# Patient Record
Sex: Female | Born: 1940
Health system: Southern US, Community
[De-identification: ages and names within clinical notes are randomized; demographics above are authoritative.]

## PROBLEM LIST (undated history)

## (undated) DIAGNOSIS — Z808 Family history of malignant neoplasm of other organs or systems: Secondary | ICD-10-CM

## (undated) DIAGNOSIS — Z8051 Family history of malignant neoplasm of kidney: Secondary | ICD-10-CM

## (undated) DIAGNOSIS — I10 Essential (primary) hypertension: Secondary | ICD-10-CM

## (undated) DIAGNOSIS — E785 Hyperlipidemia, unspecified: Secondary | ICD-10-CM

## (undated) DIAGNOSIS — Z803 Family history of malignant neoplasm of breast: Secondary | ICD-10-CM

## (undated) DIAGNOSIS — Z950 Presence of cardiac pacemaker: Secondary | ICD-10-CM

## (undated) DIAGNOSIS — Z801 Family history of malignant neoplasm of trachea, bronchus and lung: Secondary | ICD-10-CM

## (undated) DIAGNOSIS — C50919 Malignant neoplasm of unspecified site of unspecified female breast: Secondary | ICD-10-CM

## (undated) DIAGNOSIS — R350 Frequency of micturition: Secondary | ICD-10-CM

## (undated) DIAGNOSIS — Z85828 Personal history of other malignant neoplasm of skin: Secondary | ICD-10-CM

## (undated) DIAGNOSIS — Z8042 Family history of malignant neoplasm of prostate: Secondary | ICD-10-CM

## (undated) DIAGNOSIS — E039 Hypothyroidism, unspecified: Secondary | ICD-10-CM

## (undated) DIAGNOSIS — I442 Atrioventricular block, complete: Secondary | ICD-10-CM

## (undated) DIAGNOSIS — Z780 Asymptomatic menopausal state: Secondary | ICD-10-CM

## (undated) DIAGNOSIS — C439 Malignant melanoma of skin, unspecified: Secondary | ICD-10-CM

## (undated) DIAGNOSIS — C4492 Squamous cell carcinoma of skin, unspecified: Secondary | ICD-10-CM

## (undated) DIAGNOSIS — M1990 Unspecified osteoarthritis, unspecified site: Secondary | ICD-10-CM

## (undated) DIAGNOSIS — E079 Disorder of thyroid, unspecified: Secondary | ICD-10-CM

## (undated) HISTORY — DX: Family history of malignant neoplasm of kidney: Z80.51

## (undated) HISTORY — PX: TOTAL KNEE ARTHROPLASTY: SHX125

## (undated) HISTORY — DX: Asymptomatic menopausal state: Z78.0

## (undated) HISTORY — PX: BLADDER REPAIR: SHX76

## (undated) HISTORY — DX: Disorder of thyroid, unspecified: E07.9

## (undated) HISTORY — DX: Family history of malignant neoplasm of other organs or systems: Z80.8

## (undated) HISTORY — PX: COLONOSCOPY: SHX174

## (undated) HISTORY — DX: Presence of cardiac pacemaker: Z95.0

## (undated) HISTORY — DX: Atrioventricular block, complete: I44.2

## (undated) HISTORY — DX: Family history of malignant neoplasm of breast: Z80.3

## (undated) HISTORY — DX: Unspecified osteoarthritis, unspecified site: M19.90

## (undated) HISTORY — PX: KNEE ARTHROSCOPY: SUR90

## (undated) HISTORY — DX: Essential (primary) hypertension: I10

## (undated) HISTORY — PX: ABDOMINAL HYSTERECTOMY: SHX81

## (undated) HISTORY — DX: Family history of malignant neoplasm of prostate: Z80.42

## (undated) HISTORY — DX: Family history of malignant neoplasm of trachea, bronchus and lung: Z80.1

## (undated) HISTORY — DX: Malignant neoplasm of unspecified site of unspecified female breast: C50.919

## (undated) HISTORY — DX: Hyperlipidemia, unspecified: E78.5

---
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
## 1998-06-14 ENCOUNTER — Other Ambulatory Visit: Admission: RE | Admit: 1998-06-14 | Discharge: 1998-06-14 | Payer: Self-pay | Admitting: Obstetrics and Gynecology

## 2001-11-25 ENCOUNTER — Ambulatory Visit (HOSPITAL_COMMUNITY): Admission: RE | Admit: 2001-11-25 | Discharge: 2001-11-25 | Payer: Self-pay | Admitting: Obstetrics and Gynecology

## 2001-11-25 ENCOUNTER — Encounter: Payer: Self-pay | Admitting: Obstetrics and Gynecology

## 2002-02-25 ENCOUNTER — Encounter: Payer: Self-pay | Admitting: Family Medicine

## 2002-02-25 ENCOUNTER — Encounter: Admission: RE | Admit: 2002-02-25 | Discharge: 2002-02-25 | Payer: Self-pay | Admitting: Family Medicine

## 2005-01-08 DIAGNOSIS — I442 Atrioventricular block, complete: Secondary | ICD-10-CM

## 2005-01-08 HISTORY — DX: Atrioventricular block, complete: I44.2

## 2005-07-03 ENCOUNTER — Inpatient Hospital Stay (HOSPITAL_COMMUNITY): Admission: EM | Admit: 2005-07-03 | Discharge: 2005-07-04 | Payer: Self-pay | Admitting: Emergency Medicine

## 2005-07-03 ENCOUNTER — Encounter (INDEPENDENT_AMBULATORY_CARE_PROVIDER_SITE_OTHER): Payer: Self-pay | Admitting: Cardiology

## 2005-07-03 HISTORY — PX: PACEMAKER INSERTION: SHX728

## 2008-03-11 ENCOUNTER — Encounter (INDEPENDENT_AMBULATORY_CARE_PROVIDER_SITE_OTHER): Payer: Self-pay | Admitting: *Deleted

## 2009-04-27 ENCOUNTER — Encounter (INDEPENDENT_AMBULATORY_CARE_PROVIDER_SITE_OTHER): Payer: Self-pay | Admitting: *Deleted

## 2009-06-16 ENCOUNTER — Encounter (INDEPENDENT_AMBULATORY_CARE_PROVIDER_SITE_OTHER): Payer: Self-pay | Admitting: *Deleted

## 2009-06-20 ENCOUNTER — Ambulatory Visit: Payer: Self-pay | Admitting: Gastroenterology

## 2009-06-28 ENCOUNTER — Ambulatory Visit: Payer: Self-pay | Admitting: Gastroenterology

## 2009-06-29 ENCOUNTER — Encounter: Payer: Self-pay | Admitting: Gastroenterology

## 2010-02-07 NOTE — Letter (Signed)
Summary: Previsit letter  [facility]  [address] [place], [place] [address]   Phone: [phone]  Fax: [phone]       04/27/2009 MRN: [id number]  [facility] [address] [place], [place]  [address]  [place]  Dear Ms. Fouche,  Welcome to the Gastroenterology Division at [facility].    You are scheduled to see a nurse for your pre-procedure visit on  06/20/2009 at  8:00am  on the 3rd floor at [facility], [address]  We ask that you try to arrive at our office 15 minutes prior to your appointment time to allow for check-in.  Your nurse visit will consist of discussing your medical and surgical history, your immediate family medical history, and your medications.    Please bring a complete list of all your medications or, if you prefer, bring the medication bottles and we will list them.  We will need to be aware of both prescribed and over the counter drugs.  We will need to know exact dosage information as well.  If you are on blood thinners (Coumadin, Plavix, Aggrenox, Ticlid, etc.) please call our office today/prior to your appointment, as we need to consult with your physician about holding your medication.   Please be prepared to read and sign documents such as consent forms, a financial agreement, and acknowledgement forms.  If necessary, and with your consent, a friend or relative is welcome to sit-in on the nurse visit with you.  Please bring your insurance card so that we may make a copy of it.  If your insurance requires a referral to see a specialist, please bring your referral form from your primary care physician.  No co-pay is required for this nurse visit.     If you cannot keep your appointment, please call [phone] to cancel or reschedule prior to your appointment date.  This allows [place] the opportunity to schedule an appointment for another patient in need of care.    Thank you for choosing  Gastroenterology for your  medical needs.  We appreciate the opportunity to care for you.  Please visit [place] at our website  to learn more about our practice.                     Sincerely.                                                                                                                   The Gastroenterology Division

## 2010-02-07 NOTE — Letter (Signed)
Summary: Patient Notice- Polyp Results  [facility]  [address] [place], [place] [address]   Phone: [phone]  Fax: [phone]        June 29, 2009 MRN: [id number]    [facility] [address] [place], [place]  [address]    Dear Ms. Meggison,  I am pleased to inform you that the colon polyp(s) removed during your recent colonoscopy was (were) found to be benign (no cancer detected) upon pathologic examination.  I recommend you have a repeat colonoscopy examination in 5 years to look for recurrent polyps, as having colon polyps increases your risk for having recurrent polyps or even colon cancer in the future.  Should you develop new or worsening symptoms of abdominal pain, bowel habit changes or bleeding from the rectum or bowels, please schedule an evaluation with either your primary care physician or with me.  Continue treatment plan as outlined the day of your exam.  Please call us if you are having persistent problems or have questions about your condition that have not been fully answered at this time.  Sincerely,  Meryl Dare MD [facility]  This letter has been electronically signed by your physician.  Appended Document: Patient Notice- Polyp Results letter mailed.

## 2010-02-07 NOTE — Miscellaneous (Signed)
Summary: [facility]  Clinical Lists Changes  Medications: Added new medication of MOVIPREP 100 GM  SOLR (PEG-KCL-NACL-NASULF-NA ASC-C) As per prep instructions. - Signed Rx of MOVIPREP 100 GM  SOLR (PEG-KCL-NACL-NASULF-NA ASC-C) As per prep instructions.;  #1 x 0;  Signed;  Entered by: Ezra Sites RN;  Authorized by: Meryl Dare MD [facility];  Method used: Electronically to [id number] [address] [place], [place]  [address], Ph: [phone] or [phone], Fax: [phone] Observations: Added new observation of NKA: T (06/20/2009 7:52)    Prescriptions: MOVIPREP 100 GM  SOLR (PEG-KCL-NACL-NASULF-NA ASC-C) As per prep instructions.  #1 x 0   Entered by:   Ezra Sites RN   Authorized by:   Meryl Dare MD [facility]   Signed by:   Ezra Sites RN on 06/20/2009   Method used:   Electronically to        [facility] (retail)       [address]       [place], [place]  [address]       Ph: [phone] or [phone]       Fax: [phone]   RxID:   [id number]

## 2010-02-07 NOTE — Procedures (Signed)
Summary: Colonoscopy  Patient: [name] Note: All result statuses are Final unless otherwise noted.  Tests: (1) Colonoscopy (COL)   COL Colonoscopy           DONE     [facility]     [address]     [place], [place]  [address]           COLONOSCOPY PROCEDURE REPORT           PATIENT:  Samantha, Scott  MR#:  [id number]     BIRTHDATE:  12/26/1940, 68 yrs. old  GENDER:  female     ENDOSCOPIST:  Judie Petit T. Russella Dar, MD, [facility]           PROCEDURE DATE:  06/28/2009     PROCEDURE:  Colonoscopy with snare polypectomy     ASA CLASS:  Class II     INDICATIONS:  1) follow-up of polyp, surveillance and high-risk     screening, type unknown, 04/2003, 02/1998.     MEDICATIONS:   Fentanyl 75 mcg IV, Versed 8 mg IV     DESCRIPTION OF PROCEDURE:   After the risks benefits and     alternatives of the procedure were thoroughly explained, informed     consent was obtained.  Digital rectal exam was performed and     revealed external hemorrhoids.  The LB PCF-H180AL [id number]     endoscope was introduced through the anus and advanced to the     cecum, which was identified by both the appendix and ileocecal     valve, without limitations.  The quality of the prep was     excellent, using MoviPrep.  The instrument was then slowly     withdrawn as the colon was fully examined.     <<PROCEDUREIMAGES>>     FINDINGS:  A sessile polyp was found in the cecum. It was 10 mm in     size. Polyp was snared, then cauterized with monopolar cautery.     Retrieval was successful. snare polyp  A normal appearing     ileocecal valve, and appendiceal orifice were identified. The     ascending, hepatic flexure, transverse, splenic flexure,     descending, sigmoid colon, and rectum appeared unremarkable.     Retroflexed views in the rectum revealed no abnormalities.   The     time to cecum =  4.33  minutes. The scope was then withdrawn (time     =  15.33  min) from the patient and the procedure  completed.           COMPLICATIONS:  None           ENDOSCOPIC IMPRESSION:     1) 10 mm sessile polyp in the cecum     2) External hemorrhoids           RECOMMENDATIONS:     1) No aspirin or NSAID's for 2 weeks     2) Await pathology results     3) Repeat Colonoscopy in 5 years pending pathology review           Kayson Bullis T. Russella Dar, MD, [facility]           CC: Lupita Raider, MD           n.     Rosalie DoctorVenita Lick. Thoms Barthelemy at 06/28/2009 09:08 AM           Trudee Grip, [id number]  Note: An exclamation mark (!) indicates a result that  was not dispersed into the flowsheet. Document Creation Date: 06/28/2009 9:09 AM _______________________________________________________________________  (1) Order result status: Final Collection or observation date-time: 06/28/2009 09:01 Requested date-time:  Receipt date-time:  Reported date-time:  Referring Physician:   Ordering Physician: Claudette Head [phone]) Specimen Source:  Source: Launa Grill Order Number: [id number] Lab site:   Appended Document: Colonoscopy     Procedures Next Due Date:    Colonoscopy: 06/2014

## 2010-02-07 NOTE — Letter (Signed)
Summary: [facility] Instructions  [facility]  [address] [place], [place] [address]   Phone: [phone]  Fax: [phone]       [name]    1940-03-17    MRN: [id number]        Procedure Day /Date:Tuesday 06/28/2009     Arrival Time: 7:30 am      Procedure Time: 8:30 am     Location of Procedure:                    _x _  [facility] (4th Floor)                        PREPARATION FOR COLONOSCOPY WITH MOVIPREP   Starting 5 days prior to your procedure Thursday 6/16 do not eat nuts, seeds, popcorn, corn, beans, peas,  salads, or any raw vegetables.  Do not take any fiber supplements (e.g. Metamucil, Citrucel, and Benefiber).  THE DAY BEFORE YOUR PROCEDURE         DATE: Monday 6/20  1.  Drink clear liquids the entire day-NO SOLID FOOD  2.  Do not drink anything colored red or purple.  Avoid juices with pulp.  No orange juice.  3.  Drink at least 64 oz. (8 glasses) of fluid/clear liquids during the day to prevent dehydration and help the prep work efficiently.  CLEAR LIQUIDS INCLUDE: Water Jello Ice Popsicles Tea (sugar ok, no milk/cream) Powdered fruit flavored drinks Coffee (sugar ok, no milk/cream) Gatorade Juice: apple, white grape, white cranberry  Lemonade Clear bullion, consomm, broth Carbonated beverages (any kind) Strained chicken noodle soup Hard Candy                             4.  In the morning, mix first dose of MoviPrep solution:    Empty 1 Pouch A and 1 Pouch B into the disposable container    Add lukewarm drinking water to the top line of the container. Mix to dissolve    Refrigerate (mixed solution should be used within 24 hrs)  5.  Begin drinking the prep at 5:00 p.m. The MoviPrep container is divided by 4 marks.   Every 15 minutes drink the solution down to the next mark (approximately 8 oz) until the full liter is complete.   6.  Follow completed prep with 16 oz of clear liquid of your choice (Nothing  red or purple).  Continue to drink clear liquids until bedtime.  7.  Before going to bed, mix second dose of MoviPrep solution:    Empty 1 Pouch A and 1 Pouch B into the disposable container    Add lukewarm drinking water to the top line of the container. Mix to dissolve    Refrigerate  THE DAY OF YOUR PROCEDURE      DATE: Tuesday 6/21  Beginning at 3:30 a.m. (5 hours before procedure):         1. Every 15 minutes, drink the solution down to the next mark (approx 8 oz) until the full liter is complete.  2. Follow completed prep with 16 oz. of clear liquid of your choice.    3. You may drink clear liquids until 6:30 am (2 HOURS BEFORE PROCEDURE).   MEDICATION INSTRUCTIONS  Unless otherwise instructed, you should take regular prescription medications with a small sip of water   as early as possible the morning of your procedure.  Additional medication instructions: Do not take Lisinopril/HCTZ morning of procedure.         OTHER INSTRUCTIONS  You will need a responsible adult at least 70 years of age to accompany you and drive you home.   This person must remain in the waiting room during your procedure.  Wear loose fitting clothing that is easily removed.  Leave jewelry and other valuables at home.  However, you may wish to bring a book to read or  an iPod/MP3 player to listen to music as you wait for your procedure to start.  Remove all body piercing jewelry and leave at home.  Total time from sign-in until discharge is approximately 2-3 hours.  You should go home directly after your procedure and rest.  You can resume normal activities the  day after your procedure.  The day of your procedure you should not:   Drive   Make legal decisions   Operate machinery   Drink alcohol   Return to work  You will receive specific instructions about eating, activities and medications before you leave.    The above instructions have been reviewed and explained  to me by   Ezra Sites RN  June 20, 2009 8:22 AM     I fully understand and can verbalize these instructions _____________________________ Date _________

## 2010-03-16 ENCOUNTER — Institutional Professional Consult (permissible substitution) (INDEPENDENT_AMBULATORY_CARE_PROVIDER_SITE_OTHER): Payer: Medicare Other | Admitting: Internal Medicine

## 2010-03-16 ENCOUNTER — Encounter: Payer: Self-pay | Admitting: Internal Medicine

## 2010-03-16 DIAGNOSIS — I495 Sick sinus syndrome: Secondary | ICD-10-CM

## 2010-03-16 DIAGNOSIS — Z950 Presence of cardiac pacemaker: Secondary | ICD-10-CM | POA: Insufficient documentation

## 2010-03-16 DIAGNOSIS — I10 Essential (primary) hypertension: Secondary | ICD-10-CM | POA: Insufficient documentation

## 2010-03-21 NOTE — Assessment & Plan Note (Signed)
Summary: ep consult; [employer]. dr Ty Hilts has move out state....   Visit Type:  Initial Consult Primary Provider:  Marlowe Aschoff, MD    History of Present Illness: [name] is referred today by Dr. Lupita Raider for ongoing evaluation and management of symptomatic bradycardia s/p PPM. She underwent pacing in 2007 secondary to bradycardia and has done well since. Her main complaint is related to her knee where she has arthrititis. She denies c/p, sob or peripheral edema. She also has HTN and dyslipidemia.  Current Medications (verified): 1)  Amlodipine Besylate 10 Mg Tabs (Amlodipine Besylate) .... Take One Tablet By Mouth Daily 2)  Lisinopril-Hydrochlorothiazide 20-25 Mg Tabs (Lisinopril-Hydrochlorothiazide) .... Once Daily 3)  Pravastatin Sodium 40 Mg Tabs (Pravastatin Sodium) .... Take One Tablet By Mouth Daily At Bedtime 4)  Aspirin 81 Mg Tbec (Aspirin) .... Take One Tablet By Mouth Daily 5)  Synthroid 112 Mcg Tabs (Levothyroxine Sodium) .... Once Daily 6)  Tramadol-Acetaminophen 37.5-325 Mg Tabs (Tramadol-Acetaminophen) .... Two Times A Day 7)  Aleve 220 Mg Tabs (Naproxen Sodium) .... 2 Tabs Two Times A Day  Allergies (verified): No Known Drug Allergies  Past History:  Past Medical History: Last updated: 25-Mar-2010  1.  Complete heart block status post permanent pacemaker, [employer], July 03, 2005, by Dr. Corliss Marcus.  2.  Hypothyroidism, treated.  3.  Hypertension, treated.  4.  Hyperlipidemia, treated.  5.  Arthritis.  6.  Postmenopausal, on replacement therapy.  Past Surgical History: Last updated: March 25, 2010  EQUIPMENT DATA:  The pacing generator is a [employer] model  Insignia 1 Plus DR, model number C2278664, serial number [id number].  The atrial  lead is a [employer] model O3821152, serial number [id number].  The  ventricular lead is a [employer], model number P703588, serial  number [id number]  Family  History: Last updated: 2010-03-25 Mom died of lung cancer.  Dad died of myocardial  infarction, permanent pacemaker/defibrillator.  Social History: Last updated: 2010-03-25  She is retired from [employer].  Married.  No tobacco or  illicit drug use.  Rare glass of wine.  Review of Systems  The patient denies weight loss, weight gain, chest pain, syncope, dyspnea on exertion, peripheral edema, and prolonged cough.         c/o right knee pain, otherwise all systems negative.  Vital Signs:  Patient profile:   70 year old female Height:      63 inches Weight:      179 pounds BMI:     31.82 Pulse rate:   60 / minute BP sitting:   120 / 60  (right arm)  Vitals Entered By: Laurance Flatten CMA (March 16, 2010 10:52 AM)  Physical Exam  General:  Well developed, well nourished, in no acute distress.  HEENT: normal Neck: supple. No JVD. Carotids 2+ bilaterally no bruits Cor: RRR no rubs, gallops or murmur Lungs: CTA. Well healed PPM incision. Ab: soft, nontender. nondistended. No HSM. Good bowel sounds Ext: warm. no cyanosis, clubbing or edema Neuro: alert and oriented. Grossly nonfocal. affect pleasant    MD Comments:  Normal device function.  Impression & Recommendations:  Problem # 1:  CARDIAC PACEMAKER IN SITU (ICD-V45.01) Her device is working normally. Will recheck in several months.  Problem # 2:  ESSENTIAL HYPERTENSION, BENIGN (ICD-401.1) Her blood pressure is well controlled. Continue current meds. Her updated medication list for this problem includes:    Amlodipine Besylate 10 Mg Tabs (Amlodipine  besylate) .Marland Kitchen... Take one tablet by mouth daily    Lisinopril-hydrochlorothiazide 20-25 Mg Tabs (Lisinopril-hydrochlorothiazide) ..... Once daily    Aspirin 81 Mg Tbec (Aspirin) .Marland Kitchen... Take one tablet by mouth daily  Patient Instructions: 1)  Your physician wants you to follow-up in: 12 months with Dr Court Joy will receive a reminder letter in the mail two months in advance.  If you don't receive a letter, please call our office to schedule the follow-up appointment.

## 2010-03-28 NOTE — Cardiovascular Report (Signed)
Summary: Office Visit   Office Visit   Imported By: Roderic Ovens 03/22/2010 11:59:29  _____________________________________________________________________  External Attachment:    Type:   Image     Comment:   External Document

## 2010-05-26 NOTE — Op Note (Signed)
[name], [name]            ACCOUNT NO.:  [id number]   MEDICAL RECORD NO.:  [id number]          PATIENT TYPE:  INP   LOCATION:  2807                         FACILITY:  [facility]   PHYSICIAN:  Francisca December, M.D.  DATE OF BIRTH:  [birth year]   DATE OF PROCEDURE:  07/03/2005  DATE OF DISCHARGE:                                 OPERATIVE REPORT   PROCEDURE PERFORMED:  1.  Insertion dual-chamber permanent transvenous pacemaker.  2.  Left subclavian venogram.   INDICATIONS:  [name] is a 70 year old woman who presented to  Dr. Alric Seton office earlier to the day complaining of profound  fatigue.  This had been most severe for the past 2 days, but had been  occurring intermittently for the past 10 days.  She was found to have a  bradycardic rhythm and electrocardiogram showed a high degree AV block.  She  was referred to the emergency room where she was evaluated by Dr. Carolanne Grumbling and found to be in complete heart block intermittently.  She is  brought to the catheterization laboratory at this time for insertion of dual-  chamber permanent transvenous pacemaker as she is not taking any medications  that might produce this conduction abnormality.   PROCEDURE NOTE:  The patient is brought to catheterization laboratory in  fasting state.  The left prepectoral region was prepped and draped in the  usual sterile fashion.  Local anesthesia was obtained with infiltration of  1% lidocaine with epinephrine throughout the left prepectoral region.  A  left subclavian venogram was then performed with a peripheral injection of  20 mL of Omnipaque.  A digital cine angiogram was obtained in the AP  projection and road mapped to guide future left subclavian puncture.  The  venogram did demonstrate the left subclavian vein to be widely patent and  coursing in a normal fashion over the anterior surface of the first rib and  beneath the middle third of the clavicle.  There was no  evidence for  persistence of the left superior vena cava.  A 6-7 cm incision was then made  in the deltopectoral groove, and this was carried down, by sharp dissection  and electrocautery, to the prepectoral fascia.  There a plane was lifted and  pocket formed inferiorly and medially, using blunt dissection and  electrocautery.  The pocket was then packed with 1% kanamycin soaked gauze.  Two separate left subclavian punctures were then performed with an 18-gauge,  thin-wall needle through which was passed a 0.038-inch tight J guidewire.  Over the initial guidewire an 8-French tearaway sheath and dilator were  advanced.  Dilator and wire were removed and the ventricular lead was  advanced to the level of the right atrium.  The sheath was torn away.  Using  standard technique and fluoroscopic landmarks, the lead was manipulated into  the right ventricular apex.  There excellent pacing parameters were obtained  as will be noted below.  The lead was tested for diaphragmatic pacing at 10  volts and none was found.  The lead was then sutured into place using three  separate 0 silk ligatures.  Over the remaining guidewire, an 8-French  tearaway sheath and dilator were advanced.  The dilator was removed.  The  wire was allowed to remain in place and the atrial lead was advanced to the  level of the right atrium.  The sheath was then torn away.  A figure-of-  eight hemostasis suture was placed in the pectoralis muscle, around the  insertion site of the leads, to control backbleeding.  Using standard  technique and fluoroscopic landmarks, the lead was manipulated into the  right atrial appendage.  Adequate pacing parameters were not obtained.  It  was, therefore, re-manipulated into the lateral wall where excellent pacing  parameters were obtained.  This was an active fixation lead and the screw  was advanced as appropriate.  The lead was then tested for diaphragmatic  pacing at 10 volts and none was  found.  The lead was then sutured into place  using three separate 0 silk ligatures.  The remaining guidewire and the  kanamycin soaked gauze were removed from the pocket.  The pocket was  copiously irrigated using 1% kanamycin solution.  The leads were then  attached to the pacing generator, carefully identifying each by its serial  number and placing each in the appropriate receptacle.  Each lead was  tightened into place and tested for security.  The leads were then wound  beneath the pacing generator and the generator was placed in the pocket.  The generator was anchored to the pectoralis muscle with a 1 silk suture.  The pocket was then inspected for bleeding and none was found.  The pocket  was then closed using 2-0 Vicryl in a running fashion for the subcutaneous  layer.  Two layers were applied.  The skin was approximated using 4-0 Vicryl  in a running subcuticular fashion.  Steri-Strips and a sterile dressing were  applied.  The patient was transported to the recovery area an A-sense, V-  pace mode.   EQUIPMENT DATA:  The pacing generator is a [employer]  Insignia 1 Plus DR, model number C2278664, serial number [id number].  The atrial  lead is a [employer] model O3821152, serial number [id number].  The  ventricular lead is a [employer], model number P703588, serial  number [id number].   PACING DATA:  The ventricular lead detected a 9.9 mV R-wave.  The pacing  threshold was 0.2 volts at 0.5 milliseconds pulse width.  The impedance was  412 ohms, resulting in a current at capture threshold of 1.1 MA.  The atrial  lead detected a 4.4 mV P-wave.  The pacing threshold was 0.5 volts at 0.5  milliseconds pulse width.  The impedance was 556 ohms, resulting in a  current capture threshold of 1 MA.      Francisca December, M.D.  Electronically Signed     JHE/MEDQ  D:  07/03/2005  T:  07/04/2005  Job:  [id number]   cc:   Donia Guiles, M.D.  Fax: [phone]

## 2010-05-26 NOTE — Discharge Summary (Signed)
NAMELUBERTA, [name]            ACCOUNT NO.:  [id number]   MEDICAL RECORD NO.:  [id number]          PATIENT TYPE:  INP   LOCATION:  6531                         FACILITY:  [facility]   PHYSICIAN:  Armanda Magic, M.D.     DATE OF BIRTH:  1940/05/06   DATE OF ADMISSION:  07/03/2005  DATE OF DISCHARGE:  07/04/2005                                 DISCHARGE SUMMARY   DISCHARGE DIAGNOSES:  1.  Complete heart block status post permanent pacemaker, Guidant, July 03, 2005, by Dr. Corliss Marcus.  2.  Hypothyroidism, treated.  3.  Hypertension, treated.  4.  Hyperlipidemia, treated.  5.  Arthritis.  6.  Postmenopausal, on replacement therapy.   Ms. Perfetti is a 70 year old female who was admitted on July 03, 2005, after  a 17-month history of fatigue and dyspnea on exertion.  Her symptoms have  exacerbated over the past 2 weeks.  She saw her primary care physician  today.  A 12-lead EKG showed a 2nd degree AV block.  She was sent  immediately to the emergency room, where she transiently when in and out of  3rd degree AV block.  She was taken to the catheterization lab the same day,  and a Guidant permanent pacemaker was implanted by Dr. Corliss Marcus.  The  patient tolerated the procedure well and was kept overnight for observation.   A 2-D echo was also performed to assure no underlying gross abnormalities as  far as wall motion or ejection fraction.  Her echo was essentially normal  with an EF of 60%.  No wall motion abnormalities.  She had mild MR with mild  pulmonary hypertension.  Our plan is for her to go home today following a  chest x-ray, which unfortunately has not been performed.  As long as the  chest x-ray shows no pneumothorax, she will be discharged to home in stable  condition.   DISCHARGE MEDICATIONS:  1.  Lisinopril/hydrochlorothiazide 20/25 one p.o. daily.  2.  Synthroid 125 mcg a day.  3.  Premarin 0.6 mg a day.  4.  Baby aspirin 81 mg a day.   She was given a  discharge instruction sheet regarding permanent pacemakers  and activity/wound care.  She has a followup appointment to see Dr. Amil Amen  on July 10, 2005, at 1:15 p.m.      Guy Franco, P.A.      Armanda Magic, M.D.  Electronically Signed    LB/MEDQ  D:  07/04/2005  T:  07/04/2005  Job:  [id number]   cc:   Francisca December, M.D.  Fax: [phone]   Donia Guiles, M.D.  Fax: [phone]

## 2010-05-26 NOTE — H&P (Signed)
NAMEBRIGITT, MCCLISH            ACCOUNT NO.:  [id number]   MEDICAL RECORD NO.:  [id number]          PATIENT TYPE:  INP   LOCATION:  1829                         FACILITY:  [facility]   PHYSICIAN:  Armanda Magic, M.D.     DATE OF BIRTH:  [birth year]   DATE OF ADMISSION:  07/03/2005  DATE OF DISCHARGE:                                HISTORY & PHYSICAL   CHIEF COMPLAINT:  Fatigue.   HISTORY OF PRESENT ILLNESS:  Ms. Roll is a 70 year old female with no  known history of coronary artery disease who complains of fatigue and  dyspnea on exertion for 2 months.  Her symptoms were worse over the past 2  weeks.  She finally made an appointment with Dr. Arvilla Market and saw him today  in the office.  Electrocardiogram showed second degree AV block; however, on  the monitor in the emergency room, she has transiently developed third  degree AV block with a ventricular response of 38 beats per minute.  She  denies any chest pain, dizziness, presyncope or syncope.  Other than the  above symptoms, the patient denies any other problems.   PAST MEDICAL HISTORY:  1.  Hypothyroidism (dose recently changed).  2.  Hypertension.  3.  Dyslipidemia.  The patient was on Zetia, but stated that she had      bilateral knee pain and stopped it.  4.  Arthritis.  5.  _________ on Premarin.   ALLERGIES:  No known drug allergies.   MEDICATIONS:  1.  Lisinopril/HCTZ 25/25 mg one p.o. daily.  2.  Synthroid 125 mcg daily.  3.  Premarin 0.6 mg daily.  4.  Baby aspirin 81 mg daily.  5.  Aleve daily for knee arthritis.   SOCIAL HISTORY:  She is retired from Engineer, [employer].  Married.  No tobacco or  illicit drug use.  Rare glass of wine.   FAMILY HISTORY:  Mom died of lung cancer.  Dad died of myocardial  infarction, permanent pacemaker/defibrillator.   PHYSICAL EXAMINATION:  VITAL SIGNS:  Temperature 97.7, blood pressure  152/73, pulse 40, respirations 20.  HEENT:  Grossly normal.  No carotid or subclavian bruits.  No  JVD or  thyromegaly.  Sclerae clear.  Conjunctivae normal.  Nares without drainage.  CHEST:  Clear to auscultation bilaterally.  No wheezing or rhonchi.  HEART:  Irregular.  A soft 1/6 systolic murmur.  ABDOMEN:  Soft.  Good bowel sounds.  Nontender, nondistended.  No masses, no  femoral bruits.  EXTREMITIES:  No peripheral edema.  Palpable PT pulses bilaterally.  NEUROLOGIC:  Cranial nerves II-XII grossly intact.  SKIN:  Warm and dry.   LABORATORY DATA:  Currently pending.   CHEST X-RAY:  Pending.   ELECTROCARDIOGRAM:  A third degree AV block with ventricular rate of 39.   ASSESSMENT AND PLAN:  1.  Third degree AV block.  Plan to admit to CCU, check electrolytes.  Plan      a permanent pacemaker today under the care of Dr. Lorenda Peck.  2.  Hypothyroidism; replace.  3.  Hypertension, treated.  4.  Postmenopausal on replacement therapy.  5.  Dyslipidemia.   The patient has been seen and examined by Dr. Armanda Magic.  The plan has  been generated by Dr. Mayford Knife.  We will check a 2-D echocardiogram to assure  no significant wall motion abnormalities.  Plans are to check the patient's  electrolytes and then plan for permanent pacemaker today under the care of  Dr. Corliss Marcus.      Guy Franco, P.A.      Armanda Magic, M.D.  Electronically Signed    LB/MEDQ  D:  07/03/2005  T:  07/03/2005  Job:  [id number]   cc:   Donia Guiles, M.D.  Fax: [phone]

## 2010-06-15 DIAGNOSIS — I495 Sick sinus syndrome: Secondary | ICD-10-CM

## 2010-06-16 ENCOUNTER — Encounter: Payer: Self-pay | Admitting: Internal Medicine

## 2010-06-19 ENCOUNTER — Encounter: Payer: Self-pay | Admitting: Internal Medicine

## 2010-06-23 ENCOUNTER — Encounter: Payer: Self-pay | Admitting: Internal Medicine

## 2010-08-08 ENCOUNTER — Telehealth: Payer: Self-pay | Admitting: Internal Medicine

## 2010-08-08 NOTE — Telephone Encounter (Signed)
Walk In Pt Form " Pt dropped off Surgery Clearance paper from [facility]" sent to Kelly/Taylor  08/08/10/km

## 2010-10-27 ENCOUNTER — Ambulatory Visit (HOSPITAL_COMMUNITY)
Admission: RE | Admit: 2010-10-27 | Discharge: 2010-10-27 | Disposition: A | Discharge status: Home / Self Care | Payer: Medicare Other | Source: Ambulatory Visit | Attending: Orthopedic Surgery | Admitting: Orthopedic Surgery

## 2010-10-27 ENCOUNTER — Other Ambulatory Visit: Payer: Self-pay | Admitting: Orthopedic Surgery

## 2010-10-27 ENCOUNTER — Encounter (HOSPITAL_COMMUNITY): Payer: Medicare Other

## 2010-10-27 ENCOUNTER — Other Ambulatory Visit (HOSPITAL_COMMUNITY): Payer: Self-pay | Admitting: Orthopedic Surgery

## 2010-10-27 DIAGNOSIS — Z01818 Encounter for other preprocedural examination: Secondary | ICD-10-CM | POA: Insufficient documentation

## 2010-10-27 DIAGNOSIS — I10 Essential (primary) hypertension: Secondary | ICD-10-CM | POA: Insufficient documentation

## 2010-10-27 DIAGNOSIS — Z01810 Encounter for preprocedural cardiovascular examination: Secondary | ICD-10-CM | POA: Insufficient documentation

## 2010-10-27 DIAGNOSIS — Z950 Presence of cardiac pacemaker: Secondary | ICD-10-CM | POA: Insufficient documentation

## 2010-10-27 DIAGNOSIS — Z01812 Encounter for preprocedural laboratory examination: Secondary | ICD-10-CM | POA: Insufficient documentation

## 2010-10-27 DIAGNOSIS — M1710 Unilateral primary osteoarthritis, unspecified knee: Secondary | ICD-10-CM | POA: Insufficient documentation

## 2010-10-27 DIAGNOSIS — Z01811 Encounter for preprocedural respiratory examination: Secondary | ICD-10-CM | POA: Insufficient documentation

## 2010-10-27 LAB — COMPREHENSIVE METABOLIC PANEL
AST: 17 U/L (ref 0–37)
Albumin: 4.2 g/dL (ref 3.5–5.2)
Alkaline Phosphatase: 74 U/L (ref 39–117)
Chloride: 105 mEq/L (ref 96–112)
Potassium: 4.7 mEq/L (ref 3.5–5.1)
Sodium: 142 mEq/L (ref 135–145)
Total Bilirubin: 0.3 mg/dL (ref 0.3–1.2)
Total Protein: 7.6 g/dL (ref 6.0–8.3)

## 2010-10-27 LAB — CBC
HCT: 40.2 % (ref 36.0–46.0)
Hemoglobin: 13.3 g/dL (ref 12.0–15.0)
RBC: 4.45 MIL/uL (ref 3.87–5.11)
WBC: 7.1 10*3/uL (ref 4.0–10.5)

## 2010-10-27 LAB — URINALYSIS, ROUTINE W REFLEX MICROSCOPIC
Glucose, UA: NEGATIVE mg/dL
Ketones, ur: NEGATIVE mg/dL
Leukocytes, UA: NEGATIVE

## 2010-10-27 LAB — PROTIME-INR
INR: 0.98 (ref 0.00–1.49)
Prothrombin Time: 13.2 seconds (ref 11.6–15.2)

## 2010-10-27 LAB — SURGICAL PCR SCREEN: Staphylococcus aureus: NEGATIVE

## 2010-10-30 ENCOUNTER — Inpatient Hospital Stay (HOSPITAL_COMMUNITY)
Admission: RE | Admit: 2010-10-30 | Discharge: 2010-11-02 | DRG: 470 | Disposition: A | Discharge status: Home Health | Payer: Medicare Other | Source: Ambulatory Visit | Attending: Orthopedic Surgery | Admitting: Orthopedic Surgery

## 2010-10-30 DIAGNOSIS — Z79899 Other long term (current) drug therapy: Secondary | ICD-10-CM

## 2010-10-30 DIAGNOSIS — Z950 Presence of cardiac pacemaker: Secondary | ICD-10-CM

## 2010-10-30 DIAGNOSIS — Z01812 Encounter for preprocedural laboratory examination: Secondary | ICD-10-CM

## 2010-10-30 DIAGNOSIS — E7800 Pure hypercholesterolemia, unspecified: Secondary | ICD-10-CM | POA: Diagnosis present

## 2010-10-30 DIAGNOSIS — M21169 Varus deformity, not elsewhere classified, unspecified knee: Secondary | ICD-10-CM | POA: Diagnosis present

## 2010-10-30 DIAGNOSIS — E039 Hypothyroidism, unspecified: Secondary | ICD-10-CM | POA: Diagnosis present

## 2010-10-30 DIAGNOSIS — Z01810 Encounter for preprocedural cardiovascular examination: Secondary | ICD-10-CM

## 2010-10-30 DIAGNOSIS — M1710 Unilateral primary osteoarthritis, unspecified knee: Principal | ICD-10-CM | POA: Diagnosis present

## 2010-10-30 DIAGNOSIS — I10 Essential (primary) hypertension: Secondary | ICD-10-CM | POA: Diagnosis present

## 2010-10-30 DIAGNOSIS — Z7982 Long term (current) use of aspirin: Secondary | ICD-10-CM

## 2010-10-30 LAB — TYPE AND SCREEN: Antibody Screen: NEGATIVE

## 2010-10-30 LAB — ABO/RH: ABO/RH(D): O POS

## 2010-10-31 LAB — CBC
Hemoglobin: 10.4 g/dL — ABNORMAL LOW (ref 12.0–15.0)
MCHC: 34.1 g/dL (ref 30.0–36.0)
Platelets: 189 10*3/uL (ref 150–400)
RBC: 3.42 MIL/uL — ABNORMAL LOW (ref 3.87–5.11)

## 2010-10-31 LAB — BASIC METABOLIC PANEL
GFR calc non Af Amer: 88 mL/min — ABNORMAL LOW (ref >90)
Glucose, Bld: 137 mg/dL — ABNORMAL HIGH (ref 70–99)
Potassium: 3.7 mEq/L (ref 3.5–5.1)
Sodium: 134 mEq/L — ABNORMAL LOW (ref 135–145)

## 2010-11-01 LAB — CBC
Hemoglobin: 10.3 g/dL — ABNORMAL LOW (ref 12.0–15.0)
MCH: 30 pg (ref 26.0–34.0)
Platelets: 183 10*3/uL (ref 150–400)
RBC: 3.43 MIL/uL — ABNORMAL LOW (ref 3.87–5.11)
WBC: 14.2 10*3/uL — ABNORMAL HIGH (ref 4.0–10.5)

## 2010-11-01 LAB — BASIC METABOLIC PANEL
CO2: 27 mEq/L (ref 19–32)
Calcium: 8.9 mg/dL (ref 8.4–10.5)
Chloride: 100 mEq/L (ref 96–112)
Glucose, Bld: 118 mg/dL — ABNORMAL HIGH (ref 70–99)
Potassium: 3.6 mEq/L (ref 3.5–5.1)
Sodium: 135 mEq/L (ref 135–145)

## 2010-11-01 NOTE — H&P (Addendum)
[name], [name]            ACCOUNT NO.:  [id number]  MEDICAL RECORD NO.:  [id number]  LOCATION:                                 FACILITY:  PHYSICIAN:  Ollen Gross, M.D.    DATE OF BIRTH:  1940-12-07  DATE OF ADMISSION:  10/30/2010 DATE OF DISCHARGE:                             HISTORY & PHYSICAL   CHIEF COMPLAINT:  Right knee pain.  HISTORY OF PRESENT ILLNESS:  Patient is a 70 year old female, who has been seen by Dr. Lequita Halt for ongoing knee pain.  She has known end-stage arthritis, progressively gotten worse.  She has failed conservative measures, so she would benefit undergoing surgical intervention.  Risks and benefits have been discussed.  She elected to proceed with surgery. There are no contraindications such as ongoing infection or progressive neurological disease.  ALLERGIES:  No known drug allergies.  CURRENT MEDICATIONS: 1. Amlodipine 10 mg. 2. Lisinopril and hydrochlorothiazide 20/25. 3. Synthroid 0.112 mg. 4. Pravastatin 40 mg. 5. Baby aspirin. 6. Tramadol. 7. Venaliv.  PAST MEDICAL HISTORY: 1. Hypertension. 2. Hypercholesterolemia. 3. Pacemaker placement. 4. Hypothyroidism.  PAST SURGICAL HISTORY: 1. Hysterectomy. 2. Knee arthroscopy. 3. Pacemaker procedure.  FAMILY HISTORY:  Father with heart disease.  Mother with lung cancer.  SOCIAL HISTORY:  Married, retired, past smoker, 3 children.  Husband will be assisting in care.  Does have a living will, healthcare power of attorney.  REVIEW OF SYSTEMS:  GENERAL:  No fever, chills, or night sweats.  NEURO: No seizure, syncope, or paralysis.  RESPIRATORY:  No shortness breath, productive cough, or hemoptysis.  CARDIOVASCULAR:  No chest pain or orthopnea.  GI:  No nausea, vomiting, diarrhea, or constipation.  GU: No dysuria, hematuria, or discharge.  MUSCULOSKELETAL:  Knee pain.  PHYSICAL EXAMINATION:  VITAL SIGNS:  Pulse 68, respirations 14, blood pressure 136/70. GENERAL:  A 70 year old  white female, well nourished, well developed, in no acute distress.  She is alert, oriented, and cooperative. HEENT:  Normocephalic, atraumatic.  Pupils are round and reactive.  EOMs intact.  Does not wear glasses. NECK:  Supple. CHEST:  Clear, anterior posterior chest walls.  No rhonchi, rales, or wheezing. HEART:  Pacemaker is in the upper left chest wall.  Heart, regular rate and rhythm without murmur, S1, S2.  ABDOMEN:  Soft, nontender.  Bowel sounds are present. RECTAL/BREASTS/GENITALIA:  Not done, not part of present illness. EXTREMITIES:  Right knee, slight varus.  Range of motion 5 to 125, tender more medial and lateral, no instability.  IMPRESSION:  Osteoarthritis, right knee.  PLAN:  The patient will be admitted to [facility] to undergo a right total knee replacement arthroplasty.  Surgery will be performed by Dr. Ollen Gross.     [name], P.A.C.   ______________________________ Ollen Gross, M.D.    ALP/MEDQ  D:  10/29/2010  T:  10/30/2010  Job:  [id number]  cc:   Ollen Gross, M.D. Fax: [phone]  Doylene Canning. Ladona Ridgel, MD [address]  [address] [place] [address]  Lupita Raider, M.D. Fax: [phone]  Electronically Signed by Patrica Duel P.A.C. on 10/30/2010 04:24:49 PM Electronically Signed by Ollen Gross M.D. on 11/01/2010 11:32:12 AM Electronically Signed by Ollen Gross  M.D. on 11/01/2010 11:34:26 AM

## 2010-11-01 NOTE — Op Note (Addendum)
NAMEKHRYSTINA, [name] NO.:  [id number]  MEDICAL RECORD NO.:  [id number]  LOCATION:  1609                         FACILITY:  [facility]  PHYSICIAN:  Ollen Gross, M.D.    DATE OF BIRTH:  Sep 18, 1940  DATE OF PROCEDURE:  10/30/2010 DATE OF DISCHARGE:                              OPERATIVE REPORT   PREOPERATIVE DIAGNOSIS:  Osteoarthritis, right knee.  POSTOPERATIVE DIAGNOSIS:  Osteoarthritis, right knee.  PROCEDURE:  Right total knee arthroplasty.  SURGEON:  Ollen Gross, M.D.  ASSISTANT:  Alexzandrew L. Perkins, P.A.C.  ANESTHESIA:  Spinal.  ESTIMATED BLOOD LOSS:  Minimal.  DRAIN:  Hemovac x1.  TOURNIQUET TIME:  35 minutes at 300 mmHg.  FINDINGS:  Bone-on-bone arthritis of the medial and patellofemoral compartments of the right knee with varus deformity and periarticular osteophytes.  COMPLICATIONS:  None.  CONDITION:  Stable to recovery.  BRIEF CLINICAL NOTE:  [name] is a 70 year old female with advanced end-stage arthritis of the right knee with progressively worsening pain and dysfunction.  She has difficulty with activities of daily living. She has had extensive long-term nonoperative management including exercise, corticosteroid injections, and viscosupplement injections without benefit.  Analgesics have been ineffective.  It is now limiting her significantly.  She has pain at rest and with activity.  She has essentially failed nonoperative management and presents for total knee arthroplasty.  PROCEDURE IN DETAIL:  After successful administration of spinal anesthetic, a tourniquet was placed high on her right thigh and her right lower extremity.  It was prepped and draped in the usual sterile fashion.  Extremities wrapped in Esmarch, knee flexed, tourniquet inflated to 300 mmHg.  Midline incision was made with a 10 blade through subcutaneous tissue to the level of the extensor mechanism.  A fresh blade was used to make a medial  parapatellar arthrotomy.  Soft tissue on the proximal medial tibia was subperiosteally elevated to the joint line with the knife and into the semimembranosus bursa with a Cobb elevator. Soft tissue laterally was elevated with attention being paid to avoiding the patellar tendon on a tibial tubercle.  The patella was everted, knee flexed 90 degrees, and ACL and PCL removed.  Drill was used to create a starting hole in the distal femur and the canal was thoroughly irrigated.  The 5-degree right valgus alignment guide was placed and the distal femoral cutting block was pinned to remove 11-mm off the distal femur.  Distal femoral resection was made with an oscillating saw.  The tibia subluxed forward and the menisci removed.  The extramedullary tibial alignment guide was placed referencing proximally at the medial aspect of the tibial tubercle and distally along the second metatarsal axis and tibial crest.  The block was pinned to remove 2-mm off the more deficient medial side.  Tibial resection was made with an oscillating saw.  Size 2.5 was the most appropriate tibial component.  The proximal tibia was prepared to modular drill and keel punch for the size 2.5.  Femoral sizing guide was placed and size 2.5 was the most appropriate on the femur.  The rotations marked off the epicondylar axis and confirmed by creating a rectangular flexion gap at 90 degrees.  The block was  pinned in this rotation and the anterior, posterior, and chamfer cuts were made.  Intercondylar block was placed and that cut was made.  Trial size 2.5 posterior stabilized femur was placed.  A 10-mm posterior stabilized rotating platform insert trial was placed.  With the 10, full extensions achieved with excellent varus-valgus and anterior-posterior balance throughout full range of motion.  Patella was everted and the thickness measured to be 22-mm.  Freehand resection taken to 12-mm, 35 template was placed, lug holes  were drilled, trial patella was placed and it tracked normally.  Osteophytes removed off the posterior femur with the trial in place.  All trials were removed and the cut bone surfaces prepared with pulsatile lavage.  Cement was mixed and once ready for implantation, the size 2.5 mobile bearing tibial tray, 2.5 posterior stabilized femur, and 35 patella were cemented into place. The patella was held with a clamp.  Trial 10-mm inserts placed, knee held in full extension, and all extruded cement removed.  When the cement was fully hardened and the knee permanent, 10-mm posterior stabilized rotating platform insert was placed into the tibial tray. Wound was copiously irrigated with saline solution and the arthrotomy closed over Hemovac drain with interrupted #1 PDS.  Flexion against gravity was 135 degrees and patella tracked normally.  Tourniquet was released for a total tourniquet time of 35 minutes.  Subcu was closed with interrupted 2-0 Vicryl and subcuticular running 4-0 Monocryl. Catheter for the Marcaine pain pump was placed and pumps initiated. Incisions cleaned and dried.  Steri-Strips and a bulky sterile dressing applied.  She was then placed into a knee immobilizer, awakened, and transported to recovery in stable condition.  Please note that a surgical assistant was a medical necessity for this procedure in order to perform it in a safe and expeditious manner. Surgical assistant was necessary for retraction of ligaments and vital neurovascular structures and for proper positionings of the limb to allow for anatomic placement of prosthetic components.     Ollen Gross, M.D.     FA/MEDQ  D:  10/30/2010  T:  10/30/2010  Job:  [id number]  cc:   Lupita Raider, M.D. Fax: [phone]  Electronically Signed by Ollen Gross M.D. on 11/01/2010 11:32:16 AM Electronically Signed by Ollen Gross M.D. on 11/01/2010 11:34:30 AM

## 2010-11-02 LAB — CBC
HCT: 28.1 % — ABNORMAL LOW (ref 36.0–46.0)
Hemoglobin: 9.2 g/dL — ABNORMAL LOW (ref 12.0–15.0)
MCH: 29.6 pg (ref 26.0–34.0)
MCV: 90.4 fL (ref 78.0–100.0)
RBC: 3.11 MIL/uL — ABNORMAL LOW (ref 3.87–5.11)
WBC: 11.9 10*3/uL — ABNORMAL HIGH (ref 4.0–10.5)

## 2010-11-12 NOTE — Discharge Summary (Signed)
[name], [name]            ACCOUNT NO.:  [id number]  MEDICAL RECORD NO.:  [id number]  LOCATION:  1609                         FACILITY:  [facility]  PHYSICIAN:  Ollen Gross, M.D.    DATE OF BIRTH:  [birth year]  DATE OF ADMISSION:  10/30/2010 DATE OF DISCHARGE:  11/02/2010                              DISCHARGE SUMMARY   ADMITTING DIAGNOSES: 1. Osteoarthritis, right knee. 2. Hypertension. 3. Hypercholesterolemia. 4. Pacemaker placement. 5. Hypothyroidism.  DISCHARGE DIAGNOSES: 1. Osteoarthritis, right knee, status post right total knee     replacement arthroplasty. 2. Postoperative acute blood loss anemia. 3. Hyponatremia, improving. 4. Hypertension. 5. Hypercholesterolemia. 6. Pacemaker placement. 7. Hypothyroidism.  PROCEDURE:  On October 30, 2010, right total knee.  SURGEON:  Ollen Gross, M.D.  ASSISTANT:  Alexzandrew L. Perkins, P.A.C.  ANESTHESIA:  Spinal.  TOURNIQUET TIME:  35 minutes.  CONSULTS:  None.  BRIEF HISTORY:  The patient is a 70 year old female seen by Dr. Lequita Scott for ongoing knee pain.  She has known bone-on-bone arthritis, medium patellofemoral parts of the right knee with varus deformity, periarticular osteophytes, she has failed operative management, now presents for total knee arthroplasty.  LABORATORY DATA:  CBC not scanned in the chart, the one that was done on admission, but postop hemoglobin of 10.4 dropped down to 10.3, last H and H down to 9.2 with a crit of 28.1.  Chem panel on admission not scanned in the chart, but serial BMETs were followed, sodium was 134, little low on day #1, but came back up to 135.  Remaining electrolytes remained within normal limits.  Glucose did go up a little bit to 137 back down to 118.  Blood group type O positive.  HOSPITAL COURSE:  The patient was admitted to [facility], taken to the OR, underwent the above-stated procedure without complication.  The patient tolerated the procedure  well, later was transferred to the orthopedic floor.  She was given 24 hours postop IV antibiotics and p.o. and IV analgesics, had some pain through the night, a little bit better by the morning of day 1, had decent urinary output, we watched that.  Hemovac drain placed on the day of surgery was pulled without difficulty.  Sodium was a little low, but felt to be a dilutional component, she concentrated right back up.  She started to get out of bed on day #1.  By day #2, was already sitting up in the chair, no complaints, pain was under better control.  We changed her dressing.  The incision looked good.  Hemoglobin was 10.3.  She continued to progress with physical therapy and was ready to go home by the following day, on day #3 when met all of her goals.  DISCHARGE PLAN: 1. The patient discharged home on November 02, 2010. 2. Discharge diagnoses, please see above. 3. Discharge meds; Robaxin, OxyIR, Xarelto.  Continue amlodipine,     lisinopril, HCTZ, pravastatin, Synthroid, and tramadol, also Nu-     Iron.  DIET:  Heart healthy diet.  FOLLOWUP:  In 2 weeks.  ACTIVITY:  Weightbearing as tolerated.  Total knee protocol.  DISPOSITION:  Home.  CONDITION ON DISCHARGE:  Improving.     Alexzandrew L.  Julien Girt, P.A.C.   ______________________________ Ollen Gross, M.D.    ALP/MEDQ  D:  11/09/2010  T:  11/09/2010  Job:  [id number]  cc:   Doylene Canning. Ladona Ridgel, MD [address]  [address] [place] [address]  Lupita Raider, M.D. Fax: [phone]  Electronically Signed by Patrica Duel P.A.C. on 11/11/2010 09:27:45 AM Electronically Signed by Ollen Gross M.D. on 11/12/2010 08:40:06 PM

## 2010-11-13 SURGERY — ARTHROPLASTY, KNEE, TOTAL
Anesthesia: Choice | Site: Knee | Laterality: Right

## 2010-12-11 ENCOUNTER — Encounter: Payer: Self-pay | Admitting: Internal Medicine

## 2010-12-11 DIAGNOSIS — I495 Sick sinus syndrome: Secondary | ICD-10-CM

## 2011-03-12 ENCOUNTER — Encounter: Payer: Self-pay | Admitting: Internal Medicine

## 2011-03-12 DIAGNOSIS — I495 Sick sinus syndrome: Secondary | ICD-10-CM

## 2011-05-25 ENCOUNTER — Encounter: Payer: Self-pay | Admitting: Internal Medicine

## 2011-05-25 ENCOUNTER — Ambulatory Visit (INDEPENDENT_AMBULATORY_CARE_PROVIDER_SITE_OTHER): Payer: Medicare Other | Admitting: Internal Medicine

## 2011-05-25 VITALS — BP 125/72 | HR 70 | Ht 63.0 in | Wt 181.0 lb

## 2011-05-25 DIAGNOSIS — I10 Essential (primary) hypertension: Secondary | ICD-10-CM

## 2011-05-25 DIAGNOSIS — Z950 Presence of cardiac pacemaker: Secondary | ICD-10-CM

## 2011-05-25 LAB — PACEMAKER DEVICE OBSERVATION
AL IMPEDENCE PM: 460 Ohm
ATRIAL PACING PM: 0
BAMS-0003: 70 {beats}/min
RV LEAD IMPEDENCE PM: 340 Ohm
RV LEAD THRESHOLD: 1 V
VENTRICULAR PACING PM: 100

## 2011-05-25 NOTE — Assessment & Plan Note (Signed)
Her device is working normally. We'll plan to recheck in several months. 

## 2011-05-25 NOTE — Patient Instructions (Signed)
Your physician wants you to follow-up in: 1 year with Dr. Taylor. You will receive a reminder letter in the mail two months in advance. If you don't receive a letter, please call our office to schedule the follow-up appointment.  

## 2011-05-25 NOTE — Progress Notes (Signed)
HPI [name] returns today for followup. She is a pleasant 71 yo woman with a h/o symptomatic bradycardia, status post permanent pacemaker insertion. She also has hypertension. Since her last visit, she has undergone knee replacement surgery and has done well. She has no pain. She denies chest pain, shortness of breath, or peripheral edema. No Known Allergies   Current Outpatient Prescriptions  Medication Sig Dispense Refill  . amLODipine (NORVASC) 10 MG tablet Take 1 tablet by mouth Daily.      Marland Kitchen aspirin 81 MG tablet Take 81 mg by mouth daily.      Marland Kitchen lisinopril-hydrochlorothiazide (PRINZIDE,ZESTORETIC) 20-25 MG per tablet Take 1 tablet by mouth Daily.      . naproxen sodium (ANAPROX) 220 MG tablet Take 220 mg by mouth 2 (two) times daily with a meal.      . pravastatin (PRAVACHOL) 40 MG tablet Take 1 tablet by mouth Daily.      Marland Kitchen SYNTHROID 112 MCG tablet Take 1 tablet by mouth Daily.      . traMADol-acetaminophen (ULTRACET) 37.5-325 MG per tablet Take 1 tablet by mouth every 6 (six) hours as needed.         Past Medical History  Diagnosis Date  . CHB (complete heart block) 2007  . Arthritis   . Postmenopausal   . Essential hypertension, benign   . Cardiac pacemaker in situ     ROS:   All systems reviewed and negative except as noted in the HPI.   Past Surgical History  Procedure Date  . Pacemaker insertion 07-03-2005    [employer] Guidant      Family History  Problem Relation Age of Onset  . Lung cancer Mother   . Heart attack Father     pacemaker & defibrillator     History   Social History  . Marital Status: Married    Spouse Name: N/A    Number of Children: N/A  . Years of Education: N/A   Occupational History  . retired    Social History Main Topics  . Smoking status: Never Smoker   . Smokeless tobacco: Not on file  . Alcohol Use: Yes     wine rarely  . Drug Use: No  . Sexually Active: Not on file   Other Topics Concern  . Not on file     Social History Narrative  . No narrative on file     BP 125/72  Pulse 70  Ht 5\' 3"  (1.6 m)  Wt 181 lb (82.101 kg)  BMI 32.06 kg/m2  Physical Exam:  Well appearing 71 year old woman, NAD HEENT: Unremarkable Neck:  No JVD, no thyromegally Lungs:  Clear with no wheezes, rales, or rhonchi. Well-healed pacemaker incision. HEART:  Regular rate rhythm, no murmurs, no rubs, no clicks Abd:  soft, positive bowel sounds, no organomegally, no rebound, no guarding Ext:  2 plus pulses, no edema, no cyanosis, no clubbing Skin:  No rashes no nodules Neuro:  CN II through XII intact, motor grossly intact  DEVICE  Normal device function.  See PaceArt for details.   Assess/Plan:

## 2011-05-25 NOTE — Assessment & Plan Note (Signed)
Her blood pressure is well controlled. She will continue her current medical therapy, increase her physical activity, and maintain a low-sodium diet.

## 2011-06-11 DIAGNOSIS — I495 Sick sinus syndrome: Secondary | ICD-10-CM

## 2011-09-11 DIAGNOSIS — I495 Sick sinus syndrome: Secondary | ICD-10-CM

## 2011-12-13 DIAGNOSIS — I495 Sick sinus syndrome: Secondary | ICD-10-CM

## 2012-03-13 DIAGNOSIS — I495 Sick sinus syndrome: Secondary | ICD-10-CM

## 2012-03-28 ENCOUNTER — Encounter: Payer: Self-pay | Admitting: Internal Medicine

## 2012-06-03 ENCOUNTER — Ambulatory Visit (INDEPENDENT_AMBULATORY_CARE_PROVIDER_SITE_OTHER): Payer: Medicare Other | Admitting: Internal Medicine

## 2012-06-03 ENCOUNTER — Encounter: Payer: Self-pay | Admitting: Internal Medicine

## 2012-06-03 VITALS — BP 122/64 | HR 75 | Ht 63.0 in | Wt 194.0 lb

## 2012-06-03 DIAGNOSIS — I442 Atrioventricular block, complete: Secondary | ICD-10-CM

## 2012-06-03 DIAGNOSIS — I10 Essential (primary) hypertension: Secondary | ICD-10-CM

## 2012-06-03 DIAGNOSIS — Z950 Presence of cardiac pacemaker: Secondary | ICD-10-CM

## 2012-06-03 LAB — PACEMAKER DEVICE OBSERVATION
AL AMPLITUDE: 2.7 mv
AL THRESHOLD: 0.5 V
ATRIAL PACING PM: 2
BAMS-0001: 170 {beats}/min
BAMS-0002: 8 ms
DEVICE MODEL PM: 316585
RV LEAD IMPEDENCE PM: 350 Ohm
RV LEAD THRESHOLD: 1 V

## 2012-06-03 NOTE — Progress Notes (Signed)
HPI [name] returns today for followup. She is a very pleasant 72 year old woman with a history of complete heart block, status post permanent pacemaker insertion. She also has hypertension. In the interim, she has done well. She denies chest pain, shortness of breath, or syncope. Minimal peripheral edema is present. She admits to some sodium indiscretion.  No Known Allergies   Current Outpatient Prescriptions  Medication Sig Dispense Refill  . amLODipine (NORVASC) 10 MG tablet Take 1 tablet by mouth Daily.      Marland Kitchen aspirin 81 MG tablet Take 81 mg by mouth daily.      Marland Kitchen lisinopril-hydrochlorothiazide (PRINZIDE,ZESTORETIC) 20-25 MG per tablet Take 1 tablet by mouth Daily.      . naproxen sodium (ANAPROX) 220 MG tablet Take 220 mg by mouth 2 (two) times daily with a meal.      . pravastatin (PRAVACHOL) 40 MG tablet Take 1 tablet by mouth Daily.      Marland Kitchen SYNTHROID 112 MCG tablet Take 1 tablet by mouth Daily.      . traMADol-acetaminophen (ULTRACET) 37.5-325 MG per tablet Take 1 tablet by mouth every 6 (six) hours as needed.       No current facility-administered medications for this visit.     Past Medical History  Diagnosis Date  . CHB (complete heart block) 2007  . Arthritis   . Postmenopausal   . Essential hypertension, benign   . Cardiac pacemaker in situ   . Atrioventricular block, complete     ROS:   All systems reviewed and negative except as noted in the HPI.   Past Surgical History  Procedure Laterality Date  . Pacemaker insertion  07-03-2005    [employer] Guidant      Family History  Problem Relation Age of Onset  . Lung cancer Mother   . Heart attack Father     pacemaker & defibrillator     History   Social History  . Marital Status: Married    Spouse Name: N/A    Number of Children: N/A  . Years of Education: N/A   Occupational History  . retired    Social History Main Topics  . Smoking status: Never Smoker   . Smokeless tobacco: Not on  file  . Alcohol Use: Yes     Comment: wine rarely  . Drug Use: No  . Sexually Active: Not on file   Other Topics Concern  . Not on file   Social History Narrative  . No narrative on file     BP 122/64  Pulse 75  Ht 5\' 3"  (1.6 m)  Wt 194 lb (87.998 kg)  BMI 34.37 kg/m2  Physical Exam:  Well appearing 72 year old woman, NAD HEENT: Unremarkable Neck:  No JVD, no thyromegally Lymphatics:  No adenopathy Back:  No CVA tenderness Lungs:  Clear with no wheezes, rales, or rhonchi. Well-healed pacemaker incision. HEART:  Regular rate rhythm, no murmurs, no rubs, no clicks Abd:  soft, positive bowel sounds, no organomegally, no rebound, no guarding Ext:  2 plus pulses, no edema, no cyanosis, no clubbing Skin:  No rashes no nodules Neuro:  CN II through XII intact, motor grossly intact  DEVICE  Normal device function.  See PaceArt for details.   Assess/Plan:

## 2012-06-03 NOTE — Assessment & Plan Note (Signed)
Her [employer] dual-chamber pacemaker is working normally. We'll plan to recheck in several months. 

## 2012-06-03 NOTE — Assessment & Plan Note (Signed)
Her blood pressure is well controlled. We'll plan to recheck in several months. No change in medical therapy.

## 2012-06-03 NOTE — Patient Instructions (Signed)
Your physician wants you to follow-up in: 12 months with Dr. Taylor. You will receive a reminder letter in the mail two months in advance. If you don't receive a letter, please call our office to schedule the follow-up appointment.    

## 2012-06-12 ENCOUNTER — Encounter: Payer: Self-pay | Admitting: Internal Medicine

## 2012-09-23 DIAGNOSIS — I495 Sick sinus syndrome: Secondary | ICD-10-CM

## 2012-10-09 ENCOUNTER — Encounter: Payer: Self-pay | Admitting: Internal Medicine

## 2012-11-21 IMAGING — CR DG CHEST 2V
2 series · 2 of 2 positions shown · non-contrast
Comparison: 07/04/2005

CLINICAL DATA: Preoperative assessment for knee replacement,
hypertension, former smoker

CHEST - 2 VIEW

[w chest pa]
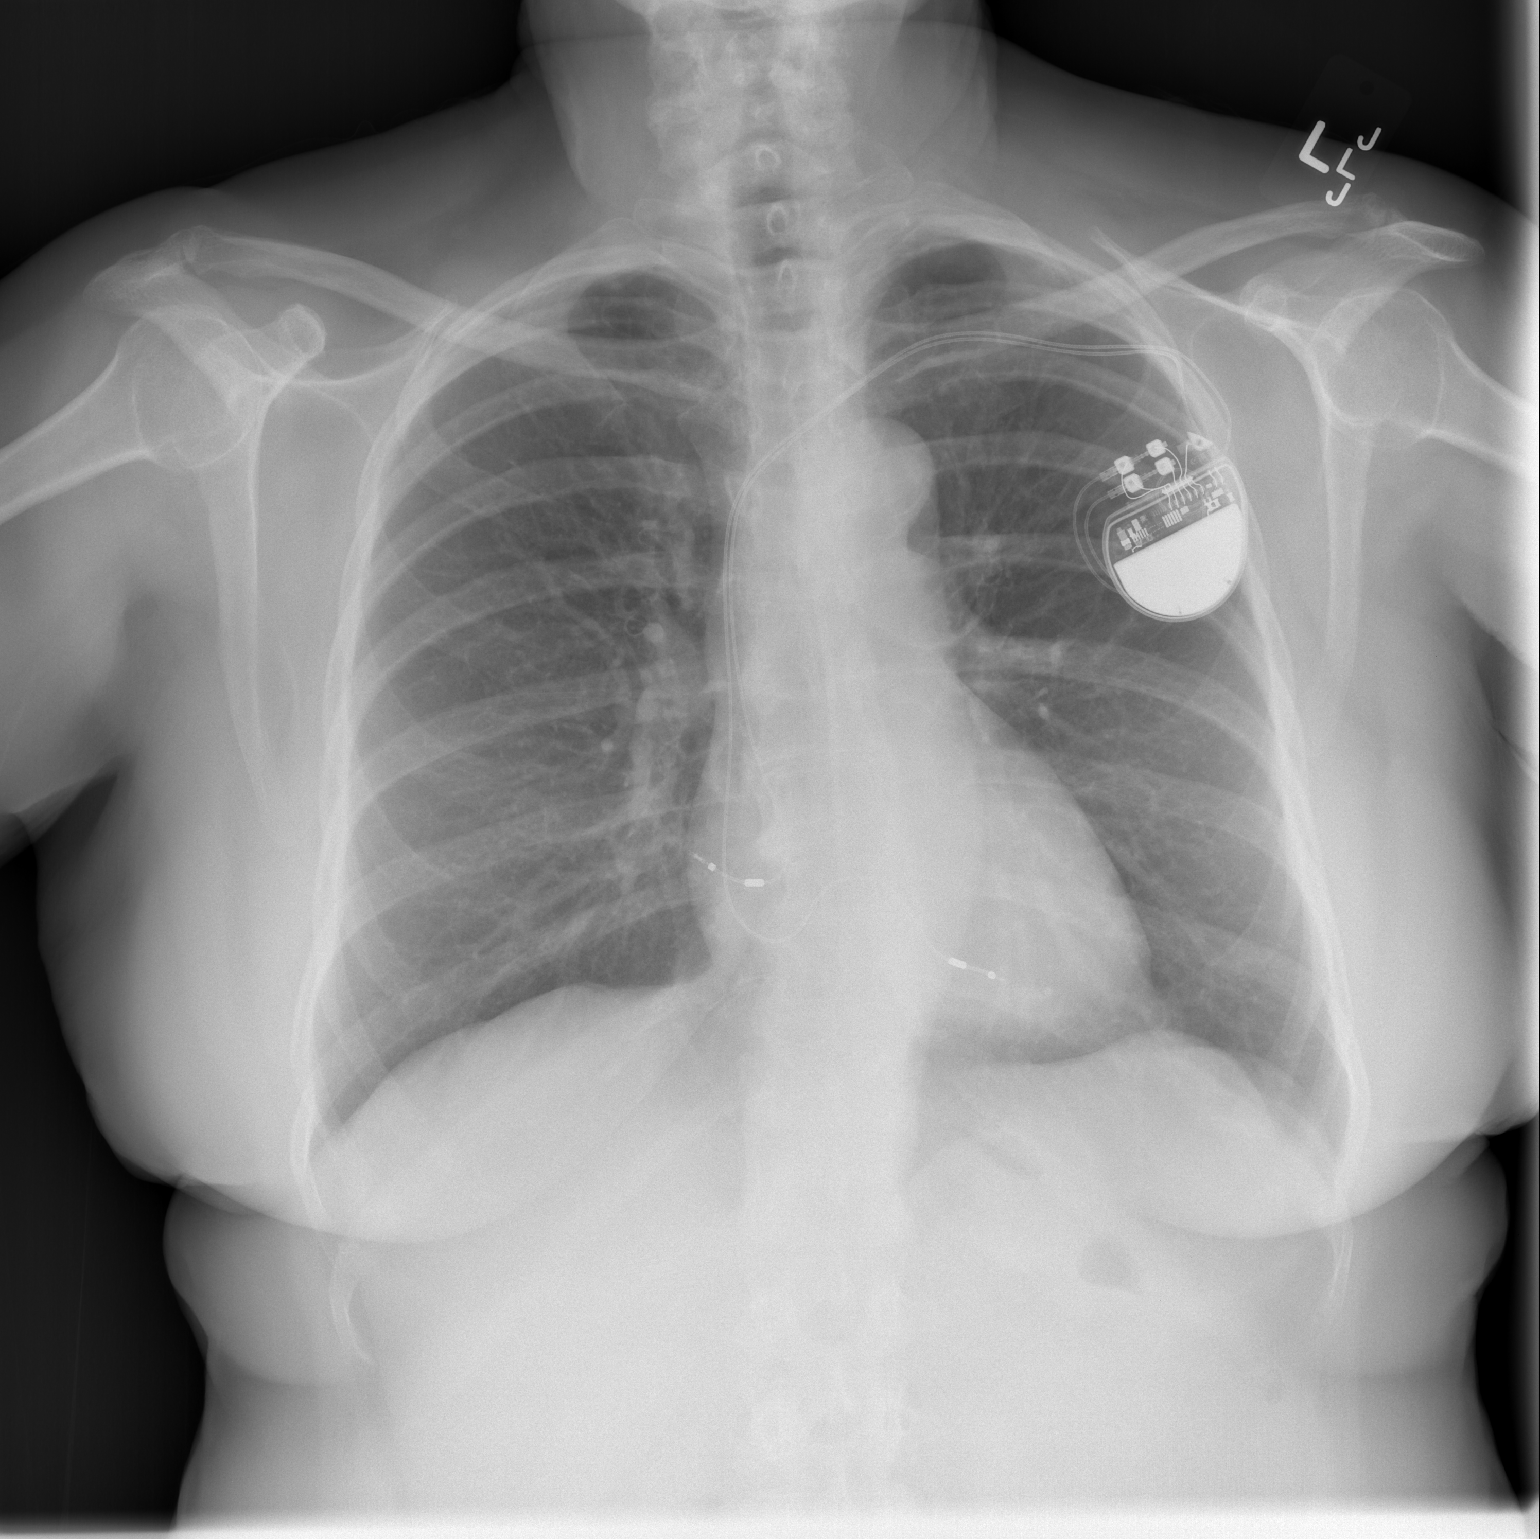

[w chest lat]
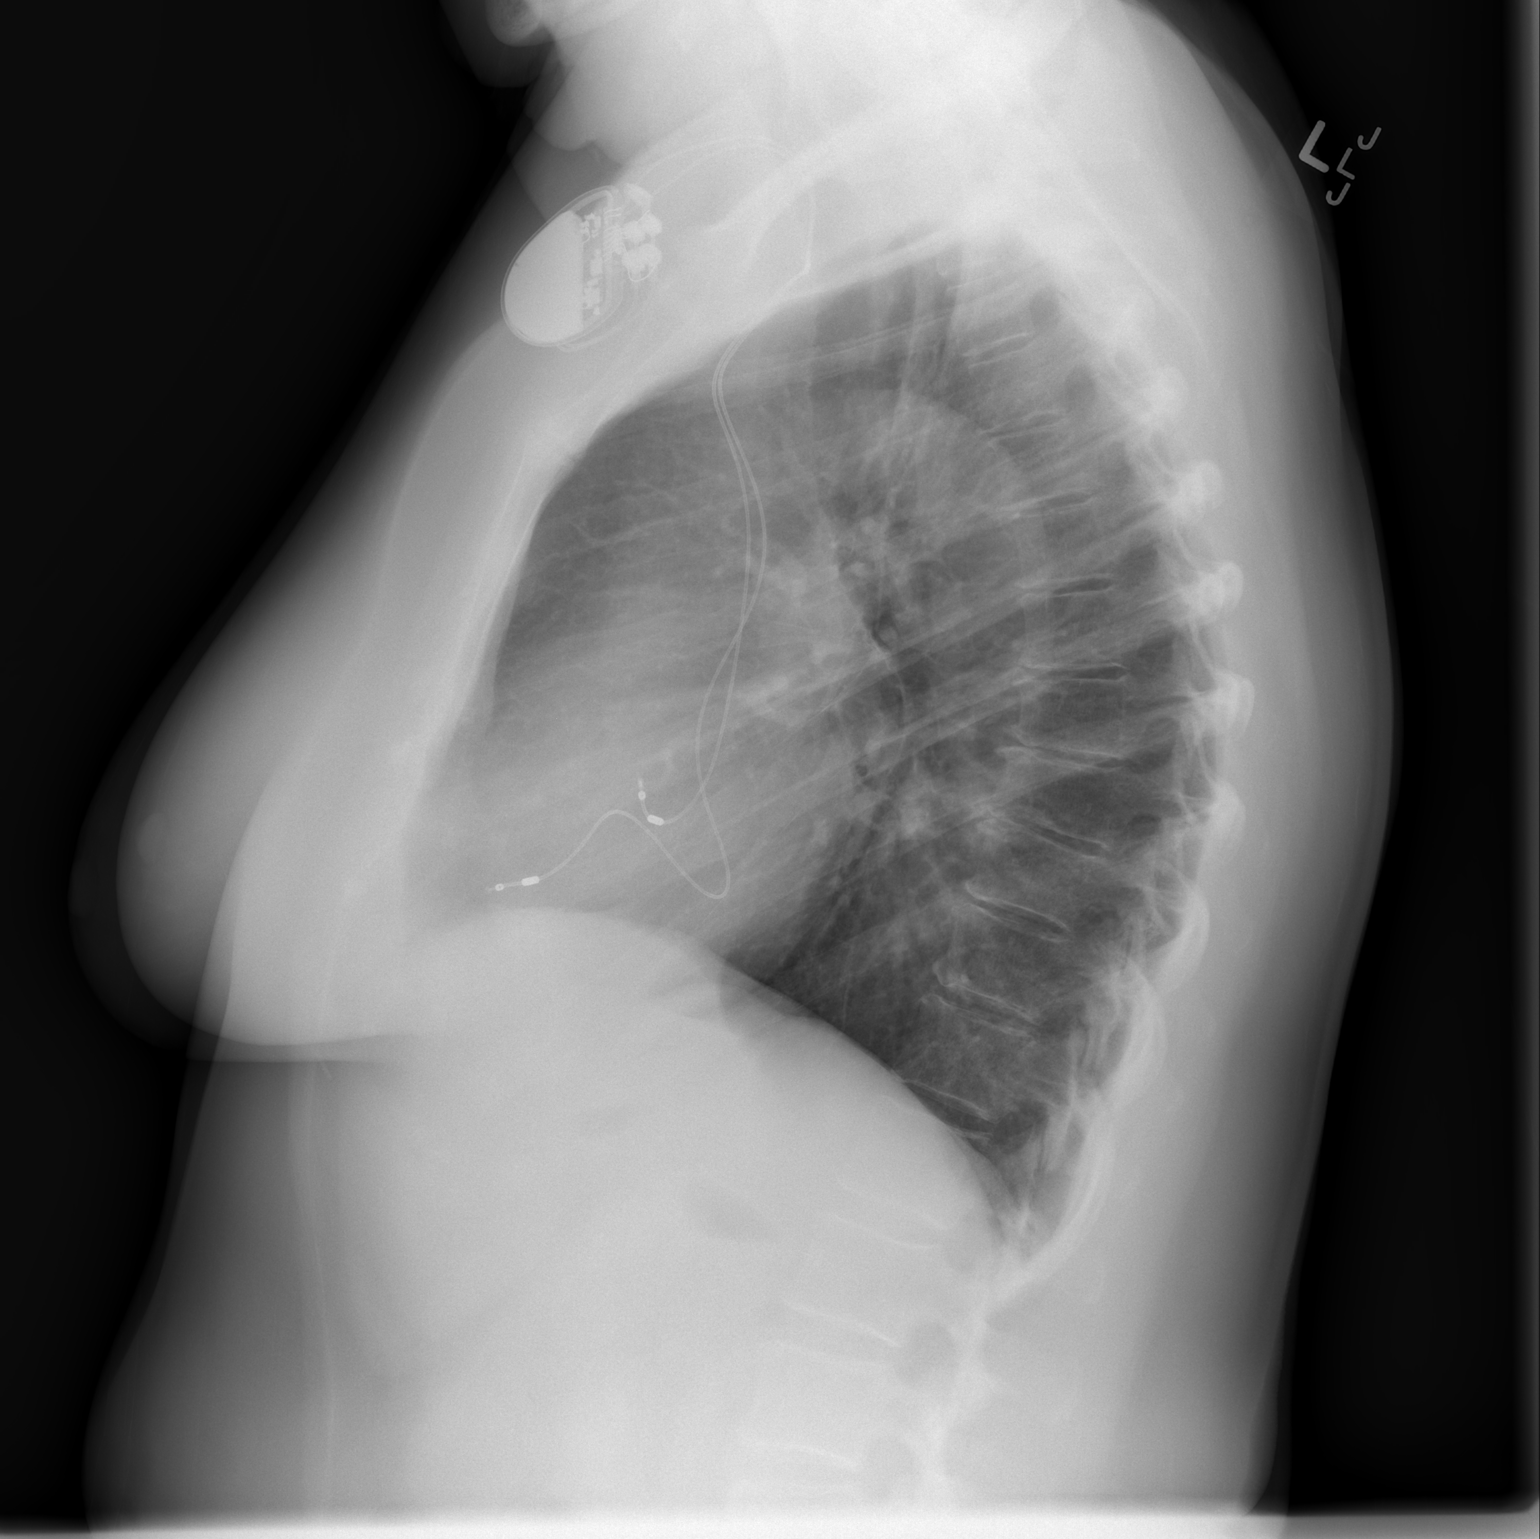

[2 of 2 positions shown; findings below may reference images not displayed]

FINDINGS: Left subclavian sequential transvenous pacemaker leads project at
right atrium and right ventricle.
Normal heart size and pulmonary vascularity.
Mild elongation of thoracic aorta.
Minimal chronic peribronchial thickening.
Lungs clear.
No pleural effusion or pneumothorax.
Scattered end plate spur formation thoracic spine.
IMPRESSION: Pacemaker.
Minimal chronic bronchitic changes.
No acute abnormalities.

## 2012-11-25 ENCOUNTER — Telehealth: Payer: Self-pay | Admitting: Internal Medicine

## 2012-11-25 NOTE — Telephone Encounter (Signed)
Received request from Nurse fax box, documents faxed for surgical clearance. To: [facility] Fax number: [phone] Attention: 11/25/12/KM

## 2012-12-23 ENCOUNTER — Encounter: Payer: Self-pay | Admitting: Internal Medicine

## 2012-12-23 DIAGNOSIS — I495 Sick sinus syndrome: Secondary | ICD-10-CM

## 2013-02-20 ENCOUNTER — Other Ambulatory Visit: Payer: Self-pay | Admitting: Orthopedic Surgery

## 2013-03-02 ENCOUNTER — Inpatient Hospital Stay (HOSPITAL_COMMUNITY): Admission: RE | Admit: 2013-03-02 | Payer: Medicare Other | Source: Ambulatory Visit | Admitting: Orthopedic Surgery

## 2013-03-02 ENCOUNTER — Encounter (HOSPITAL_COMMUNITY): Admission: RE | Payer: Self-pay | Source: Ambulatory Visit

## 2013-03-02 SURGERY — ARTHROPLASTY, KNEE, TOTAL
Anesthesia: Choice | Site: Knee | Laterality: Left

## 2013-03-12 ENCOUNTER — Encounter: Payer: Self-pay | Admitting: Internal Medicine

## 2013-03-12 ENCOUNTER — Ambulatory Visit (INDEPENDENT_AMBULATORY_CARE_PROVIDER_SITE_OTHER): Payer: Medicare Other | Admitting: Internal Medicine

## 2013-03-12 VITALS — BP 113/74 | HR 89 | Ht 63.5 in | Wt 184.2 lb

## 2013-03-12 DIAGNOSIS — T82111A Breakdown (mechanical) of cardiac pulse generator (battery), initial encounter: Secondary | ICD-10-CM

## 2013-03-12 DIAGNOSIS — M79609 Pain in unspecified limb: Secondary | ICD-10-CM

## 2013-03-12 DIAGNOSIS — I10 Essential (primary) hypertension: Secondary | ICD-10-CM

## 2013-03-12 DIAGNOSIS — I442 Atrioventricular block, complete: Secondary | ICD-10-CM

## 2013-03-12 DIAGNOSIS — M79603 Pain in arm, unspecified: Secondary | ICD-10-CM

## 2013-03-12 DIAGNOSIS — R0602 Shortness of breath: Secondary | ICD-10-CM

## 2013-03-12 LAB — MDC_IDC_ENUM_SESS_TYPE_INCLINIC
Brady Statistic RA Percent Paced: 2 %
Date Time Interrogation Session: 20150305050000
Lead Channel Impedance Value: 380 Ohm
Lead Channel Impedance Value: 470 Ohm
Lead Channel Pacing Threshold Amplitude: 0.4 V
Lead Channel Pacing Threshold Amplitude: 0.9 V
Lead Channel Pacing Threshold Pulse Width: 0.4 ms
Lead Channel Sensing Intrinsic Amplitude: 6 mV
Lead Channel Setting Pacing Amplitude: 2.4 V
Lead Channel Setting Pacing Pulse Width: 0.4 ms
MDC IDC MSMT LEADCHNL RV PACING THRESHOLD PULSEWIDTH: 0.4 ms
MDC IDC PG SERIAL: 316585
MDC IDC SET LEADCHNL RA PACING AMPLITUDE: 2 V
MDC IDC SET LEADCHNL RV SENSING SENSITIVITY: 2.5 mV
MDC IDC STAT BRADY RV PERCENT PACED: 99 %

## 2013-03-12 NOTE — Assessment & Plan Note (Signed)
Her blood pressure is well controlled. She will continue her current meds.  

## 2013-03-12 NOTE — Assessment & Plan Note (Signed)
The patient has sob and arm pain, with both exertional and non-exertional components. She is quite anxious about dying after her husband died suddenly and only had posterior neck pain and was though to have arthritis. Will ask her to undergo lexiscan myoview as she is 100% paced and has pacing induced LBBB.

## 2013-03-12 NOTE — Assessment & Plan Note (Signed)
The patient has noise on her atrial lead which is reproducible with pocket manipulation. She is asymptomatic and there is no noise on the ventricular lead. Will follow. I described symptoms she might experience if the ventricular lead were to develop noise.

## 2013-03-12 NOTE — Patient Instructions (Signed)
Your physician wants you to follow-up in: 12 months with Dr Knox Saliva will receive a reminder letter in the mail two months in advance. If you don't receive a letter, please call our office to schedule the follow-up appointment.   Your physician has requested that you have a lexiscan myoview. For further information please visit [email or web address]. Please follow instruction sheet, as given.

## 2013-03-12 NOTE — Progress Notes (Signed)
HPI [name] returns today for followup. She is a very pleasant 73 year old woman with a history of complete heart block, status post permanent pacemaker insertion. She also has hypertension. In the interim, she has done well but has been saddened by the loss of her husband who died suddenly several months ago. She denies chest pain, shortness or syncope. Minimal peripheral edema is present. She notes right arm pain and sob. Sometimes with and sometimes without exertion. She admits to some sodium indiscretion.  No Known Allergies   Current Outpatient Prescriptions  Medication Sig Dispense Refill  . amLODipine (NORVASC) 10 MG tablet Take 1 tablet by mouth Daily.      Marland Kitchen aspirin 81 MG tablet Take 81 mg by mouth daily.      Marland Kitchen lisinopril-hydrochlorothiazide (PRINZIDE,ZESTORETIC) 20-25 MG per tablet Take 1 tablet by mouth Daily.      . naproxen sodium (ANAPROX) 220 MG tablet Take 220 mg by mouth 2 (two) times daily with a meal.      . pravastatin (PRAVACHOL) 40 MG tablet Take 1 tablet by mouth Daily.      Marland Kitchen SYNTHROID 112 MCG tablet Take 1 tablet by mouth Daily.      . traMADol-acetaminophen (ULTRACET) 37.5-325 MG per tablet Take 1 tablet by mouth every 6 (six) hours as needed.       No current facility-administered medications for this visit.     Past Medical History  Diagnosis Date  . CHB (complete heart block) 2007  . Arthritis   . Postmenopausal   . Essential hypertension, benign   . Cardiac pacemaker in situ   . Atrioventricular block, complete     ROS:   All systems reviewed and negative except as noted in the HPI.   Past Surgical History  Procedure Laterality Date  . Pacemaker insertion  07-03-2005    [employer] Guidant      Family History  Problem Relation Age of Onset  . Lung cancer Mother   . Heart attack Father     pacemaker & defibrillator     History   Social History  . Marital Status: Married    Spouse Name: N/A    Number of Children: N/A  .  Years of Education: N/A   Occupational History  . retired    Social History Main Topics  . Smoking status: Never Smoker   . Smokeless tobacco: Not on file  . Alcohol Use: Yes     Comment: wine rarely  . Drug Use: No  . Sexual Activity: Not on file   Other Topics Concern  . Not on file   Social History Narrative  . No narrative on file     BP 113/74  Pulse 89  Ht 5' 3.5" (1.613 m)  Wt 184 lb 3.2 oz (83.553 kg)  BMI 32.11 kg/m2  Physical Exam:  Well appearing 73 year old woman, NAD HEENT: Unremarkable Neck:  No JVD, no thyromegally Back:  No CVA tenderness Lungs:  Clear with no wheezes, rales, or rhonchi. Well-healed pacemaker incision. HEART:  Regular rate rhythm, no murmurs, no rubs, no clicks Abd:  soft, positive bowel sounds, no organomegally, no rebound, no guarding Ext:  2 plus pulses, no edema, no cyanosis, no clubbing Skin:  No rashes no nodules Neuro:  CN II through XII intact, motor grossly intact  DEVICE  Normal device function except for noise on the atrial lead.  See PaceArt for details.   Assess/Plan:

## 2013-03-23 ENCOUNTER — Ambulatory Visit (HOSPITAL_COMMUNITY): Payer: Medicare Other | Attending: Internal Medicine | Admitting: Radiology

## 2013-03-23 ENCOUNTER — Encounter: Payer: Self-pay | Admitting: Internal Medicine

## 2013-03-23 VITALS — BP 117/61 | Ht 63.5 in | Wt 180.0 lb

## 2013-03-23 DIAGNOSIS — I442 Atrioventricular block, complete: Secondary | ICD-10-CM

## 2013-03-23 DIAGNOSIS — R0602 Shortness of breath: Secondary | ICD-10-CM

## 2013-03-23 DIAGNOSIS — M79603 Pain in arm, unspecified: Secondary | ICD-10-CM

## 2013-03-23 DIAGNOSIS — M79609 Pain in unspecified limb: Secondary | ICD-10-CM | POA: Insufficient documentation

## 2013-03-23 MED ORDER — TECHNETIUM TC 99M SESTAMIBI GENERIC - CARDIOLITE
30.0000 | Freq: Once | INTRAVENOUS | Status: AC | PRN
  Administered 2013-03-23: 30 via INTRAVENOUS

## 2013-03-23 MED ORDER — ADENOSINE (DIAGNOSTIC) 3 MG/ML IV SOLN
0.5600 mg/kg | Freq: Once | INTRAVENOUS | Status: AC
  Administered 2013-03-23: 45.6 mg via INTRAVENOUS

## 2013-03-23 MED ORDER — TECHNETIUM TC 99M SESTAMIBI GENERIC - CARDIOLITE
10.0000 | Freq: Once | INTRAVENOUS | Status: AC | PRN
  Administered 2013-03-23: 10 via INTRAVENOUS

## 2013-03-23 NOTE — Progress Notes (Signed)
[facility] [address] [place], [place] [address] [phone]    Cardiology Nuclear Med Study  [name] is a 73 y.o. female     MRN : [id number]     DOB: 18-Apr-1940  Procedure Date: 03/23/2013  Nuclear Med Background Indication for Stress Test:  Evaluation for Ischemia History:  '07 ECHO: EF: 60%, PTVP: CHB Cardiac Risk Factors: Family History - CAD, Hypertension and Lipids  Symptoms:  DOE and SOB   Nuclear Pre-Procedure Caffeine/Decaff Intake:  None > 12 hrs NPO After: 6:30pm   Lungs:  clear O2 Sat: 98% on room air. IV 0.9% NS with Angio Cath:  22g  IV Site: R Antecubital x 1, tolerated well IV Started by:  Irven Baltimore, RN  Chest Size (in):  36 Cup Size: B  Height: 5' 3.5" (1.613 m)  Weight:  180 lb (81.647 kg)  BMI:  Body mass index is 31.38 kg/(m^2). Tech Comments:  Took Norvasc and Lisinopril-HCT this am. Irven Baltimore, RN.    Nuclear Med Study 1 or 2 day study: 1 day  Stress Test Type:  Adenosine  Reading MD: N/A  Order Authorizing Provider:  Cristopher Peru, MD  Resting Radionuclide: Technetium 59m Sestamibi  Resting Radionuclide Dose: 11.0 mCi   Stress Radionuclide:  Technetium 28m Sestamibi  Stress Radionuclide Dose: 33.0 mCi           Stress Protocol Rest HR: 70 Stress HR: 93  Rest BP: 117/61 Stress BP: 110/52  Exercise Time (min): n/a METS: n/a   Predicted Max HR: 148 bpm % Max HR: 62.84 bpm Rate Pressure Product: 9672   Dose of Adenosine (mg):  45.8 Dose of Lexiscan: n/a mg  Dose of Atropine (mg): n/a Dose of Dobutamine: n/a mcg/kg/min (at max HR)  Stress Test Technologist: Perrin Maltese, EMT-P  Nuclear Technologist:  Charlton Amor, CNMT     Rest Procedure:  Myocardial perfusion imaging was performed at rest 45 minutes following the intravenous administration of Technetium 70m Sestamibi. Rest ECG: V pacing  Stress Procedure:  The patient received IV adenosine at 140 mcg/kg/min for 4 minutes. This patient  had sob, chest tightness, and was lt. Headed with the Adenosine infusion. Technetium 37m Sestamibi was injected at the 2 minute mark and quantitative spect images were obtained after a 45 minute delay. Stress ECG: No significant change from baseline ECG  QPS Raw Data Images:  Mild breast attenuation.  Normal left ventricular size. Stress Images:  There is a small , moderate - severe defect at the apex .  Rest Images:  There is a small , moderate - severe defect at the apex .  Subtraction (SDS):  No evidence of ischemia.  There is a fixed apical defect that is c/w an apical MI.  I cannot rule out breast attenuation.  Transient Ischemic Dilatation (Normal <1.22):  1.02 Lung/Heart Ratio (Normal <0.45):  0.34  Quantitative Gated Spect Images QGS EDV:  102 ml QGS ESV:  40 ml  Impression Exercise Capacity:  Adenosine study with no exercise. BP Response:  Normal blood pressure response. Clinical Symptoms:  No significant symptoms noted. ECG Impression:  No significant ST segment change suggestive of ischemia. Comparison with Prior Nuclear Study: No images to compare  Overall Impression:  Low risk stress nuclear study There is a moderate -severe defect at the apex that is c/w an apical MI.  There is evidence of breast attenuation and I cannot rule out the possiblity that this abnormality is  due to the breast attenuation.   .  The defect is small and there is no eficence of ischemia.   LV Ejection Fraction: 61%.  LV Wall Motion:  NL LV Function; NL Wall Motion.   Thayer Headings, Brooke Bonito., MD, [facility] 03/23/2013, 5:26 PM Office - [phone] Pager [phone]

## 2013-06-09 ENCOUNTER — Encounter: Payer: Self-pay | Admitting: Internal Medicine

## 2013-06-09 DIAGNOSIS — I495 Sick sinus syndrome: Secondary | ICD-10-CM

## 2013-09-08 ENCOUNTER — Encounter: Payer: Self-pay | Admitting: Internal Medicine

## 2013-09-08 DIAGNOSIS — I495 Sick sinus syndrome: Secondary | ICD-10-CM

## 2013-12-08 ENCOUNTER — Encounter: Payer: Self-pay | Admitting: Internal Medicine

## 2013-12-08 DIAGNOSIS — I442 Atrioventricular block, complete: Secondary | ICD-10-CM

## 2014-02-11 ENCOUNTER — Encounter: Payer: Self-pay | Admitting: Internal Medicine

## 2014-03-16 ENCOUNTER — Encounter: Payer: Self-pay | Admitting: Internal Medicine

## 2014-04-13 ENCOUNTER — Encounter: Payer: Self-pay | Admitting: Internal Medicine

## 2014-04-13 ENCOUNTER — Ambulatory Visit (INDEPENDENT_AMBULATORY_CARE_PROVIDER_SITE_OTHER): Payer: Medicare Other | Admitting: Internal Medicine

## 2014-04-13 VITALS — BP 122/84 | HR 68 | Ht 63.0 in | Wt 171.8 lb

## 2014-04-13 DIAGNOSIS — Z950 Presence of cardiac pacemaker: Secondary | ICD-10-CM

## 2014-04-13 DIAGNOSIS — I10 Essential (primary) hypertension: Secondary | ICD-10-CM

## 2014-04-13 DIAGNOSIS — I442 Atrioventricular block, complete: Secondary | ICD-10-CM

## 2014-04-13 LAB — MDC_IDC_ENUM_SESS_TYPE_INCLINIC
Battery Remaining Longevity: 1.5
Brady Statistic RA Percent Paced: 7 %
Brady Statistic RV Percent Paced: 99 %
Date Time Interrogation Session: 20160405040000
Implantable Pulse Generator Model: 1297
Implantable Pulse Generator Serial Number: 316585
Lead Channel Impedance Value: 340 Ohm
Lead Channel Pacing Threshold Amplitude: 0.8 V
Lead Channel Pacing Threshold Pulse Width: 0.4 ms
Lead Channel Sensing Intrinsic Amplitude: 2.4 mV
Lead Channel Setting Pacing Amplitude: 2 V
Lead Channel Setting Pacing Amplitude: 2.4 V
Lead Channel Setting Sensing Sensitivity: 2.5 mV
MDC IDC MSMT LEADCHNL RA IMPEDANCE VALUE: 460 Ohm
MDC IDC MSMT LEADCHNL RA PACING THRESHOLD AMPLITUDE: 0.6 V
MDC IDC MSMT LEADCHNL RA PACING THRESHOLD PULSEWIDTH: 0.4 ms
MDC IDC SET LEADCHNL RV PACING PULSEWIDTH: 0.4 ms

## 2014-04-13 NOTE — Assessment & Plan Note (Signed)
Her blood pressure is well controlled. No change in meds. 

## 2014-04-13 NOTE — Assessment & Plan Note (Signed)
Her [employer] DDD PM is working normally. She is about 18 months from ERI.

## 2014-04-13 NOTE — Progress Notes (Signed)
HPI [name] returns today for followup. She is a very pleasant 74 year old woman with a history of complete heart block, status post permanent pacemaker insertion. She also has hypertension. In the interim, she denies chest pain, shortness or syncope. No peripheral edema is present. She admits to some sodium indiscretion.  No Known Allergies   Current Outpatient Prescriptions  Medication Sig Dispense Refill  . amLODipine (NORVASC) 10 MG tablet Take 1 tablet by mouth Daily.    Marland Kitchen aspirin 81 MG tablet Take 81 mg by mouth daily.    Marland Kitchen lisinopril-hydrochlorothiazide (PRINZIDE,ZESTORETIC) 20-25 MG per tablet Take 1 tablet by mouth Daily.    . naproxen sodium (ANAPROX) 220 MG tablet Take 220 mg by mouth 2 (two) times daily with a meal.    . pravastatin (PRAVACHOL) 40 MG tablet Take 1 tablet by mouth Daily.    Marland Kitchen SYNTHROID 112 MCG tablet Take 1 tablet by mouth Daily.    . traMADol-acetaminophen (ULTRACET) 37.5-325 MG per tablet Take 1 tablet by mouth every 6 (six) hours as needed (pain).      No current facility-administered medications for this visit.     Past Medical History  Diagnosis Date  . CHB (complete heart block) 2007  . Arthritis   . Postmenopausal   . Essential hypertension, benign   . Cardiac pacemaker in situ   . Atrioventricular block, complete     ROS:   All systems reviewed and negative except as noted in the HPI.   Past Surgical History  Procedure Laterality Date  . Pacemaker insertion  07-03-2005    [employer] Guidant      Family History  Problem Relation Age of Onset  . Lung cancer Mother   . Heart attack Father     pacemaker & defibrillator     History   Social History  . Marital Status: Married    Spouse Name: N/A  . Number of Children: N/A  . Years of Education: N/A   Occupational History  . retired    Social History Main Topics  . Smoking status: Never Smoker   . Smokeless tobacco: Not on file  . Alcohol Use: Yes     Comment:  wine rarely  . Drug Use: No  . Sexual Activity: Not on file   Other Topics Concern  . Not on file   Social History Narrative     BP 122/84 mmHg  Pulse 68  Ht 5\' 3"  (1.6 m)  Wt 171 lb 12.8 oz (77.928 kg)  BMI 30.44 kg/m2  Physical Exam:  Well appearing 74 year old woman, NAD HEENT: Unremarkable Neck:  No JVD, no thyromegally Back:  No CVA tenderness Lungs:  Clear with no wheezes, rales, or rhonchi. Well-healed pacemaker incision. HEART:  Regular rate rhythm, no murmurs, no rubs, no clicks Abd:  soft, positive bowel sounds, no organomegally, no rebound, no guarding Ext:  2 plus pulses, no edema, no cyanosis, no clubbing Skin:  No rashes no nodules Neuro:  CN II through XII intact, motor grossly intact  DEVICE  Normal device function.  See PaceArt for details.   Assess/Plan:

## 2014-04-13 NOTE — Assessment & Plan Note (Signed)
She is s/p PPM and is asymptomatic. Will follow.

## 2014-04-13 NOTE — Patient Instructions (Addendum)
Continue [facility] follow up every three months.  Your physician recommends that you schedule a follow-up appointment in: 6 months with the [facility] and in 12 months with Dr.Taylor  Your physician recommends that you continue on your current medications as directed. Please refer to the Current Medication list given to you today.

## 2014-04-15 ENCOUNTER — Encounter: Payer: Self-pay | Admitting: Internal Medicine

## 2014-05-14 ENCOUNTER — Encounter: Payer: Self-pay | Admitting: Gastroenterology

## 2014-05-25 ENCOUNTER — Encounter: Payer: Self-pay | Admitting: Internal Medicine

## 2014-05-25 ENCOUNTER — Encounter: Payer: Self-pay | Admitting: Gastroenterology

## 2014-07-14 ENCOUNTER — Encounter: Payer: Self-pay | Admitting: Internal Medicine

## 2014-07-14 DIAGNOSIS — I495 Sick sinus syndrome: Secondary | ICD-10-CM | POA: Diagnosis not present

## 2014-07-30 ENCOUNTER — Encounter: Payer: Medicare Other | Admitting: Gastroenterology

## 2014-09-02 ENCOUNTER — Encounter: Payer: Self-pay | Admitting: Gastroenterology

## 2014-10-11 ENCOUNTER — Ambulatory Visit (INDEPENDENT_AMBULATORY_CARE_PROVIDER_SITE_OTHER): Payer: Medicare Other | Admitting: *Deleted

## 2014-10-11 DIAGNOSIS — I442 Atrioventricular block, complete: Secondary | ICD-10-CM

## 2014-10-11 DIAGNOSIS — Z950 Presence of cardiac pacemaker: Secondary | ICD-10-CM

## 2014-10-11 LAB — CUP PACEART INCLINIC DEVICE CHECK
Brady Statistic RV Percent Paced: 100 %
Lead Channel Impedance Value: 460 Ohm
Lead Channel Pacing Threshold Amplitude: 0.7 V
Lead Channel Pacing Threshold Pulse Width: 0.4 ms
Lead Channel Setting Pacing Amplitude: 2 V
Lead Channel Setting Pacing Amplitude: 2.4 V
MDC IDC MSMT LEADCHNL RA SENSING INTR AMPL: 4.2 mV
MDC IDC MSMT LEADCHNL RV IMPEDANCE VALUE: 350 Ohm
MDC IDC MSMT LEADCHNL RV PACING THRESHOLD AMPLITUDE: 1.1 V
MDC IDC MSMT LEADCHNL RV PACING THRESHOLD PULSEWIDTH: 0.4 ms
MDC IDC SESS DTM: 20161003040000
MDC IDC SET LEADCHNL RV PACING PULSEWIDTH: 0.4 ms
MDC IDC SET LEADCHNL RV SENSING SENSITIVITY: 2.5 mV
MDC IDC STAT BRADY RA PERCENT PACED: 6 %
Pulse Gen Serial Number: 316585

## 2014-10-11 NOTE — Progress Notes (Signed)
Pacemaker check in clinic. Normal device function. Thresholds, sensing, impedances consistent with previous measurements. Device programmed to maximize longevity. No mode switches. 1 VT---was noise on both leads---previously noted to revise leads at [employer]. Device programmed at appropriate safety margins. Histogram distribution appropriate for patient activity level. Device programmed to optimize intrinsic conduction---changed paced AV from 250 to 155ms (pt Vp99%). Estimated longevity 1.79yrs. TTMs & ROV w/ GT in 12mo.

## 2014-10-26 ENCOUNTER — Ambulatory Visit (AMBULATORY_SURGERY_CENTER): Payer: Self-pay | Admitting: *Deleted

## 2014-10-26 ENCOUNTER — Encounter: Payer: Self-pay | Admitting: Internal Medicine

## 2014-10-26 VITALS — Ht 63.0 in | Wt 174.8 lb

## 2014-10-26 DIAGNOSIS — Z86010 Personal history of colonic polyps: Secondary | ICD-10-CM

## 2014-10-26 MED ORDER — NA SULFATE-K SULFATE-MG SULF 17.5-3.13-1.6 GM/177ML PO SOLN: ORAL | Status: DC

## 2014-10-26 NOTE — Progress Notes (Signed)
No egg or soy allergy  No anesthesia or intubation problems per pt  No diet medications taken  Registered in EMMI   

## 2014-10-27 ENCOUNTER — Other Ambulatory Visit: Payer: Self-pay | Admitting: Dermatology

## 2014-10-27 DIAGNOSIS — C4492 Squamous cell carcinoma of skin, unspecified: Secondary | ICD-10-CM

## 2014-10-27 HISTORY — DX: Squamous cell carcinoma of skin, unspecified: C44.92

## 2014-11-09 ENCOUNTER — Ambulatory Visit (AMBULATORY_SURGERY_CENTER): Payer: Medicare Other | Admitting: Gastroenterology

## 2014-11-09 ENCOUNTER — Encounter: Payer: Self-pay | Admitting: Gastroenterology

## 2014-11-09 VITALS — BP 117/68 | HR 64 | Temp 97.6°F | Resp 16 | Ht 63.0 in | Wt 174.8 lb

## 2014-11-09 DIAGNOSIS — Z86010 Personal history of colonic polyps: Secondary | ICD-10-CM | POA: Diagnosis not present

## 2014-11-09 DIAGNOSIS — D120 Benign neoplasm of cecum: Secondary | ICD-10-CM | POA: Diagnosis not present

## 2014-11-09 MED ORDER — SODIUM CHLORIDE 0.9 % IV SOLN: 500.0000 mL | INTRAVENOUS | Status: DC

## 2014-11-09 NOTE — Op Note (Signed)
[facility]  [address] [place], [id number]   COLONOSCOPY PROCEDURE REPORT  PATIENT: Samantha, Scott  MR#: [id number] BIRTHDATE: March 30, 1940 , 73  yrs. old GENDER: female ENDOSCOPIST: Ladene Artist, MD, [facility] PROCEDURE DATE:  11/09/2014 PROCEDURE:   Colonoscopy, surveillance and Colonoscopy with snare polypectomy First Screening Colonoscopy - Avg.  risk and is 50 yrs.  old or older - No.  Prior Negative Screening - Now for repeat screening. N/A  History of Adenoma - Now for follow-up colonoscopy & has been > or = to 3 yrs.  Yes hx of adenoma.  Has been 3 or more years since last colonoscopy.  Polyps removed today? Yes ASA CLASS:   Class III INDICATIONS:Surveillance due to prior colonic neoplasia and PH Colon Adenoma. MEDICATIONS: Monitored anesthesia care and Propofol 200 mg IV DESCRIPTION OF PROCEDURE:   After the risks benefits and alternatives of the procedure were thoroughly explained, informed consent was obtained.  The digital rectal exam revealed no abnormalities of the rectum.   The LB [id number]  endoscope was introduced through the anus and advanced to the cecum, which was identified by both the appendix and ileocecal valve. No adverse events experienced.   The quality of the prep was excellent. (Suprep was used)  The instrument was then slowly withdrawn as the colon was fully examined. Estimated blood loss is zero unless otherwise noted in this procedure report.    COLON FINDINGS: A sessile polyp measuring 8 mm in size was found at the cecum.  A polypectomy was performed using snare cautery.  The resection was complete, the polyp tissue was completely retrieved and sent to histology.   The examination was otherwise normal. Retroflexed views revealed no abnormalities. The time to cecum = 0.5 Withdrawal time = 10.4   The scope was withdrawn and the procedure completed. COMPLICATIONS: There were no immediate complications.  ENDOSCOPIC  IMPRESSION: 1.   Sessile polyp at the cecum; polypectomy performed using snare cautery 2.   The examination was otherwise normal  RECOMMENDATIONS: 1.  Await pathology results 2.  Hold Aspirin and all other NSAIDS for 2 weeks. 3.  Repeat Colonoscopy in 5 years.  eSigned:  Ladene Artist, MD, [facility] 11/09/2014 9:09 AM   cc: Mayra Neer, MD

## 2014-11-09 NOTE — Progress Notes (Signed)
Report to PACU, RN, vss, BBS= Clear.  

## 2014-11-09 NOTE — Patient Instructions (Signed)
NO ASPIRIN, ASPIRIN PRODUCTS OR NSAIDS ( MOTRIN, ADVIL , IBUPROFEN, ALEVE, ETC) FOR TWO WEEKS, November 23, 2014.   YOU HAD AN ENDOSCOPIC PROCEDURE TODAY AT [facility]:   Refer to the procedure report that was given to you for any specific questions about what was found during the examination.  If the procedure report does not answer your questions, please call your gastroenterologist to clarify.  If you requested that your care partner not be given the details of your procedure findings, then the procedure report has been included in a sealed envelope for you to review at your convenience later.  YOU SHOULD EXPECT: Some feelings of bloating in the abdomen. Passage of more gas than usual.  Walking can help get rid of the air that was put into your GI tract during the procedure and reduce the bloating. If you had a lower endoscopy (such as a colonoscopy or flexible sigmoidoscopy) you may notice spotting of blood in your stool or on the toilet paper. If you underwent a bowel prep for your procedure, you may not have a normal bowel movement for a few days.  Please Note:  You might notice some irritation and congestion in your nose or some drainage.  This is from the oxygen used during your procedure.  There is no need for concern and it should clear up in a day or so.  SYMPTOMS TO REPORT IMMEDIATELY:   Following lower endoscopy (colonoscopy or flexible sigmoidoscopy):  Excessive amounts of blood in the stool  Significant tenderness or worsening of abdominal pains  Swelling of the abdomen that is new, acute  Fever of 100F or higher  For urgent or emergent issues, a gastroenterologist can be reached at any hour by calling [phone].   DIET: Your first meal following the procedure should be a small meal and then it is ok to progress to your normal diet. Heavy or fried foods are harder to digest and may make you feel nauseous or bloated.  Likewise, meals heavy in dairy and  vegetables can increase bloating.  Drink plenty of fluids but you should avoid alcoholic beverages for 24 hours.  ACTIVITY:  You should plan to take it easy for the rest of today and you should NOT DRIVE or use heavy machinery until tomorrow (because of the sedation medicines used during the test).    FOLLOW UP: Our staff will call the number listed on your records the next business day following your procedure to check on you and address any questions or concerns that you may have regarding the information given to you following your procedure. If we do not reach you, we will leave a message.  However, if you are feeling well and you are not experiencing any problems, there is no need to return our call.  We will assume that you have returned to your regular daily activities without incident.  If any biopsies were taken you will be contacted by phone or by letter within the next 1-3 weeks.  Please call us at [phone] if you have not heard about the biopsies in 3 weeks.    SIGNATURES/CONFIDENTIALITY: You and/or your care partner have signed paperwork which will be entered into your electronic medical record.  These signatures attest to the fact that that the information above on your After Visit Summary has been reviewed and is understood.  Full responsibility of the confidentiality of this discharge information lies with you and/or your care-partner.

## 2014-11-09 NOTE — Progress Notes (Signed)
Called to room to assist during endoscopic procedure.  Patient ID and intended procedure confirmed with present staff. Received instructions for my participation in the procedure from the performing physician.  

## 2014-11-10 ENCOUNTER — Telehealth: Payer: Self-pay

## 2014-11-10 NOTE — Telephone Encounter (Signed)
  Follow up Call-  Call back number 11/09/2014  Post procedure Call Back phone  # [phone]  Permission to leave phone message Yes     Patient questions:  Do you have a fever, pain , or abdominal swelling? No. Pain Score  0 *  Have you tolerated food without any problems? Yes.    Have you been able to return to your normal activities? Yes.    Do you have any questions about your discharge instructions: Diet   No. Medications  No. Follow up visit  No.  Do you have questions or concerns about your Care? No.  Actions: * If pain score is 4 or above: No action needed, pain <4.

## 2014-11-15 ENCOUNTER — Encounter: Payer: Self-pay | Admitting: Gastroenterology

## 2015-01-13 DIAGNOSIS — I495 Sick sinus syndrome: Secondary | ICD-10-CM | POA: Diagnosis not present

## 2015-02-14 ENCOUNTER — Ambulatory Visit: Payer: Self-pay | Admitting: Orthopedic Surgery

## 2015-02-14 NOTE — Progress Notes (Signed)
Preoperative surgical orders have been place into the Epic hospital system for [name] on 02/14/2015, 10:48 AM  by Mickel Crow for surgery on 03-14-15.  Preop Total Knee orders including Experal, IV Tylenol, and IV Decadron as long as there are no contraindications to the above medications. Arlee Muslim, PA-C

## 2015-03-02 ENCOUNTER — Encounter: Payer: Self-pay | Admitting: Gastroenterology

## 2015-03-03 NOTE — Patient Instructions (Addendum)
YOUR PROCEDURE IS SCHEDULED ON :  03/14/15  REPORT TO [facility] MAIN ENTRANCE FOLLOW SIGNS TO EAST ELEVATOR - GO TO 3rd FLOOR CHECK IN AT 3 EAST NURSES STATION (SHORT STAY) AT:  10:45 AM  CALL THIS NUMBER IF YOU HAVE PROBLEMS THE MORNING OF SURGERY [phone]  REMEMBER:ONLY 1 PER PERSON MAY GO TO SHORT STAY WITH YOU TO GET READY THE MORNING OF YOUR SURGERY  DO NOT EAT FOOD  AFTER MIDNIGHT  MAY HAVE CLEAR LIQUIDS UNTIL 7:45 AM  TAKE THESE MEDICINES THE MORNING OF SURGERY: AMLODIPINE / PRAVASTATIN / SYNTHROID  CLEAR LIQUID DIET  Foods Allowed                                                                     Foods Excluded  Coffee and tea, regular and decaf                             liquids that you cannot  Plain Jell-O in any flavor                                             see through such as: Fruit ices (not with fruit pulp)                                     milk, soups, orange juice  Iced Popsicles                                                           All solid food Carbonated beverages, regular and diet                                    Cranberry, grape and apple juices Sports drinks like Gatorade Lightly seasoned clear broth or consume(fat free) Sugar, honey syrup  _____________________________________________________________________    YOU MAY NOT HAVE ANY METAL ON YOUR BODY INCLUDING HAIR PINS AND PIERCING'S. DO NOT WEAR JEWELRY, MAKEUP, LOTIONS, POWDERS OR PERFUMES. DO NOT WEAR NAIL POLISH. DO NOT SHAVE 48 HRS PRIOR TO SURGERY. MEN MAY SHAVE FACE AND NECK.  DO NOT [place]. [facility] IS NOT RESPONSIBLE FOR VALUABLES.  CONTACTS, DENTURES OR PARTIALS MAY NOT BE WORN TO SURGERY. LEAVE SUITCASE IN CAR. CAN BE BROUGHT TO ROOM AFTER SURGERY.  PATIENTS DISCHARGED THE DAY OF SURGERY WILL NOT BE ALLOWED TO DRIVE HOME.  PLEASE READ OVER THE FOLLOWING INSTRUCTION  SHEETS _________________________________________________________________________________                                          [facility] - PREPARING FOR SURGERY  Before surgery,  you can play an important role.  Because skin is not sterile, your skin needs to be as free of germs as possible.  You can reduce the number of germs on your skin by washing with CHG (chlorahexidine gluconate) soap before surgery.  CHG is an antiseptic cleaner which kills germs and bonds with the skin to continue killing germs even after washing. Please DO NOT use if you have an allergy to CHG or antibacterial soaps.  If your skin becomes reddened/irritated stop using the CHG and inform your nurse when you arrive at Short Stay. Do not shave (including legs and underarms) for at least 48 hours prior to the first CHG shower.  You may shave your face. Please follow these instructions carefully:   1.  Shower with CHG Soap the night before surgery and the  morning of Surgery.   2.  If you choose to wash your hair, wash your hair first as usual with your  normal  Shampoo.   3.  After you shampoo, rinse your hair and body thoroughly to remove the  shampoo.                                         4.  Use CHG as you would any other liquid soap.  You can apply chg directly  to the skin and wash . Gently wash with scrungie or clean wascloth    5.  Apply the CHG Soap to your body ONLY FROM THE NECK DOWN.   Do not use on open                           Wound or open sores. Avoid contact with eyes, ears mouth and genitals (private parts).                        Genitals (private parts) with your normal soap.              6.  Wash thoroughly, paying special attention to the area where your surgery  will be performed.   7.  Thoroughly rinse your body with warm water from the neck down.   8.  DO NOT shower/wash with your normal soap after using and rinsing off  the CHG Soap .                9.  Pat yourself dry with a clean  towel.             10.  Wear clean night clothes to bed after shower             11.  Place clean sheets on your bed the night of your first shower and do not  sleep with pets.  Day of Surgery : Do not apply any lotions/deodorants the morning of surgery.  Please wear clean clothes to the hospital/surgery center.  FAILURE TO FOLLOW THESE INSTRUCTIONS MAY RESULT IN THE CANCELLATION OF YOUR SURGERY    PATIENT SIGNATURE_________________________________  ______________________________________________________________________     [name]  An incentive spirometer is a tool that can help keep your lungs clear and active. This tool measures how well you are filling your lungs with each breath. Taking long deep breaths may help reverse or decrease the chance of developing breathing (pulmonary) problems (especially infection) following:  A long period of  time when you are unable to move or be active. BEFORE THE PROCEDURE   If the spirometer includes an indicator to show your best effort, your nurse or respiratory therapist will set it to a desired goal.  If possible, sit up straight or lean slightly forward. Try not to slouch.  Hold the incentive spirometer in an upright position. INSTRUCTIONS FOR USE   Sit on the edge of your bed if possible, or sit up as far as you can in bed or on a chair.  Hold the incentive spirometer in an upright position.  Breathe out normally.  Place the mouthpiece in your mouth and seal your lips tightly around it.  Breathe in slowly and as deeply as possible, raising the piston or the ball toward the top of the column.  Hold your breath for 3-5 seconds or for as long as possible. Allow the piston or ball to fall to the bottom of the column.  Remove the mouthpiece from your mouth and breathe out normally.  Rest for a few seconds and repeat Steps 1 through 7 at least 10 times every 1-2 hours when you are awake. Take your time and take a few  normal breaths between deep breaths.  The spirometer may include an indicator to show your best effort. Use the indicator as a goal to work toward during each repetition.  After each set of 10 deep breaths, practice coughing to be sure your lungs are clear. If you have an incision (the cut made at the time of surgery), support your incision when coughing by placing a pillow or rolled up towels firmly against it. Once you are able to get out of bed, walk around indoors and cough well. You may stop using the incentive spirometer when instructed by your caregiver.  RISKS AND COMPLICATIONS  Take your time so you do not get dizzy or light-headed.  If you are in pain, you may need to take or ask for pain medication before doing incentive spirometry. It is harder to take a deep breath if you are having pain. AFTER USE  Rest and breathe slowly and easily.  It can be helpful to keep track of a log of your progress. Your caregiver can provide you with a simple table to help with this. If you are using the spirometer at home, follow these instructions: [place] IF:   You are having difficultly using the spirometer.  You have trouble using the spirometer as often as instructed.  Your pain medication is not giving enough relief while using the spirometer.  You develop fever of 100.5 F (38.1 C) or higher. SEEK IMMEDIATE MEDICAL CARE IF:   You cough up bloody sputum that had not been present before.  You develop fever of 102 F (38.9 C) or greater.  You develop worsening pain at or near the incision site. MAKE SURE YOU:   Understand these instructions.  Will watch your condition.  Will get help right away if you are not doing well or get worse. Document Released: 05/07/2006 Document Revised: 03/19/2011 Document Reviewed: 07/08/2006 ExitCare Patient Information 2014 Exit[employer], [place].   ________________________________________________________________________  WHAT IS A BLOOD  TRANSFUSION? Blood Transfusion Information  A transfusion is the replacement of blood or some of its parts. Blood is made up of multiple cells which provide different functions.  Red blood cells carry oxygen and are used for blood loss replacement.  White blood cells fight against infection.  Platelets control bleeding.  Plasma helps clot blood.  Other blood products are available for specialized needs, such as hemophilia or other clotting disorders. BEFORE THE TRANSFUSION  Who gives blood for transfusions?   Healthy volunteers who are fully evaluated to make sure their blood is safe. This is blood bank blood. Transfusion therapy is the safest it has ever been in the practice of medicine. Before blood is taken from a donor, a complete history is taken to make sure that person has no history of diseases nor engages in risky social behavior (examples are intravenous drug use or sexual activity with multiple partners). The donor's travel history is screened to minimize risk of transmitting infections, such as malaria. The donated blood is tested for signs of infectious diseases, such as HIV and hepatitis. The blood is then tested to be sure it is compatible with you in order to minimize the chance of a transfusion reaction. If you or a relative donates blood, this is often done in anticipation of surgery and is not appropriate for emergency situations. It takes many days to process the donated blood. RISKS AND COMPLICATIONS Although transfusion therapy is very safe and saves many lives, the main dangers of transfusion include:   Getting an infectious disease.  Developing a transfusion reaction. This is an allergic reaction to something in the blood you were given. Every precaution is taken to prevent this. The decision to have a blood transfusion has been considered carefully by your caregiver before blood is given. Blood is not given unless the benefits outweigh the risks. AFTER THE  TRANSFUSION  Right after receiving a blood transfusion, you will usually feel much better and more energetic. This is especially true if your red blood cells have gotten low (anemic). The transfusion raises the level of the red blood cells which carry oxygen, and this usually causes an energy increase.  The nurse administering the transfusion will monitor you carefully for complications. HOME CARE INSTRUCTIONS  No special instructions are needed after a transfusion. You may find your energy is better. Speak with your caregiver about any limitations on activity for underlying diseases you may have. SEEK MEDICAL CARE IF:   Your condition is not improving after your transfusion.  You develop redness or irritation at the intravenous (IV) site. SEEK IMMEDIATE MEDICAL CARE IF:  Any of the following symptoms occur over the next 12 hours:  Shaking chills.  You have a temperature by mouth above 102 F (38.9 C), not controlled by medicine.  Chest, back, or muscle pain.  People around you feel you are not acting correctly or are confused.  Shortness of breath or difficulty breathing.  Dizziness and fainting.  You get a rash or develop hives.  You have a decrease in urine output.  Your urine turns a dark color or changes to pink, red, or brown. Any of the following symptoms occur over the next 10 days:  You have a temperature by mouth above 102 F (38.9 C), not controlled by medicine.  Shortness of breath.  Weakness after normal activity.  The white part of the eye turns yellow (jaundice).  You have a decrease in the amount of urine or are urinating less often.  Your urine turns a dark color or changes to pink, red, or brown. Document Released: 12/23/1999 Document Revised: 03/19/2011 Document Reviewed: 08/11/2007 [facility] Patient Information 2014 [place], [place].  _______________________________________________________________________

## 2015-03-07 ENCOUNTER — Encounter (HOSPITAL_COMMUNITY)
Admission: RE | Admit: 2015-03-07 | Discharge: 2015-03-07 | Disposition: A | Discharge status: Home / Self Care | Payer: Medicare Other | Source: Ambulatory Visit | Attending: Orthopedic Surgery | Admitting: Orthopedic Surgery

## 2015-03-07 ENCOUNTER — Encounter (HOSPITAL_COMMUNITY): Payer: Self-pay

## 2015-03-07 DIAGNOSIS — Z01818 Encounter for other preprocedural examination: Secondary | ICD-10-CM | POA: Insufficient documentation

## 2015-03-07 DIAGNOSIS — Z950 Presence of cardiac pacemaker: Secondary | ICD-10-CM | POA: Diagnosis not present

## 2015-03-07 DIAGNOSIS — Z0183 Encounter for blood typing: Secondary | ICD-10-CM | POA: Diagnosis not present

## 2015-03-07 DIAGNOSIS — Z01812 Encounter for preprocedural laboratory examination: Secondary | ICD-10-CM | POA: Insufficient documentation

## 2015-03-07 DIAGNOSIS — M1712 Unilateral primary osteoarthritis, left knee: Secondary | ICD-10-CM | POA: Insufficient documentation

## 2015-03-07 HISTORY — DX: Personal history of other malignant neoplasm of skin: Z85.828

## 2015-03-07 HISTORY — DX: Frequency of micturition: R35.0

## 2015-03-07 LAB — CBC
HCT: 45.3 % (ref 36.0–46.0)
HEMOGLOBIN: 14.4 g/dL (ref 12.0–15.0)
MCH: 29.8 pg (ref 26.0–34.0)
MCHC: 31.8 g/dL (ref 30.0–36.0)
MCV: 93.8 fL (ref 78.0–100.0)
Platelets: 250 10*3/uL (ref 150–400)
RBC: 4.83 MIL/uL (ref 3.87–5.11)
RDW: 13.8 % (ref 11.5–15.5)
WBC: 7.3 10*3/uL (ref 4.0–10.5)

## 2015-03-07 LAB — SURGICAL PCR SCREEN
MRSA, PCR: NEGATIVE
Staphylococcus aureus: NEGATIVE

## 2015-03-07 LAB — URINALYSIS, ROUTINE W REFLEX MICROSCOPIC
Bilirubin Urine: NEGATIVE
GLUCOSE, UA: NEGATIVE mg/dL
Hgb urine dipstick: NEGATIVE
KETONES UR: NEGATIVE mg/dL
NITRITE: NEGATIVE
PROTEIN: NEGATIVE mg/dL
Specific Gravity, Urine: 1.006 (ref 1.005–1.030)
pH: 7 (ref 5.0–8.0)

## 2015-03-07 LAB — URINE MICROSCOPIC-ADD ON
BACTERIA UA: NONE SEEN
RBC / HPF: NONE SEEN RBC/hpf (ref 0–5)

## 2015-03-07 LAB — COMPREHENSIVE METABOLIC PANEL
ALK PHOS: 64 U/L (ref 38–126)
ALT: 20 U/L (ref 14–54)
AST: 28 U/L (ref 15–41)
Albumin: 4.8 g/dL (ref 3.5–5.0)
Anion gap: 10 (ref 5–15)
BUN: 22 mg/dL — AB (ref 6–20)
CALCIUM: 10.1 mg/dL (ref 8.9–10.3)
CO2: 27 mmol/L (ref 22–32)
CREATININE: 1.04 mg/dL — AB (ref 0.44–1.00)
Chloride: 106 mmol/L (ref 101–111)
GFR calc non Af Amer: 52 mL/min — ABNORMAL LOW (ref >60)
GFR, EST AFRICAN AMERICAN: 60 mL/min — AB (ref >60)
Glucose, Bld: 98 mg/dL (ref 65–99)
Potassium: 5.8 mmol/L — ABNORMAL HIGH (ref 3.5–5.1)
SODIUM: 143 mmol/L (ref 135–145)
Total Bilirubin: 0.7 mg/dL (ref 0.3–1.2)
Total Protein: 8.1 g/dL (ref 6.5–8.1)

## 2015-03-07 LAB — APTT: aPTT: 27 seconds (ref 24–37)

## 2015-03-07 LAB — PROTIME-INR
INR: 1.11 (ref 0.00–1.49)
Prothrombin Time: 14 seconds (ref 11.6–15.2)

## 2015-03-08 NOTE — Progress Notes (Signed)
Abnormal CMET / UA faxed to Dr.Aluisio 

## 2015-03-09 NOTE — Progress Notes (Signed)
Received fax from office - action needed on abnormal CMET

## 2015-03-09 NOTE — Progress Notes (Signed)
Pt notified of surgery time change to 9:25 am - inst to arrive at 6:30 am - NPO after midnight

## 2015-03-13 ENCOUNTER — Ambulatory Visit: Payer: Self-pay | Admitting: Orthopedic Surgery

## 2015-03-13 NOTE — H&P (Signed)
[name] DOB: [birth year] Married / Language: English / Race: White Female Date of Admission:  03/14/2015 CC:  Left Knee Pain History of Present Illness  The patient is a 75 year old female who comes in for a preoperative History and Physical. The patient is scheduled for a left total knee arthroplasty to be performed by Dr. Dione Plover. Aluisio, MD at [facility] on 03-14-2015. The patient is being followed for their left knee pain and osteoarthritis. Symptoms reported include: pain, aching, throbbing and stiffness, while the patient does not report symptoms of: swelling. [name] comes in for left knee pain. She does have osteoarthritis in that left knee. She is aware that it is bone on bone. She has had cortisone injections in the past which have given her relief for a couple of months. She does have the right knee replaced and she has done very well over the past couple of years. She has had no injury to the knee. The pain continues to be mostly medial. No locking or catching. There is minimal swelling to the knee. She has been treated conservatively utilizing cortisone injections for some time but is now at a point where she would like to proceed with surgical intervention for the left knee. She has had the right knee replaced and now ready to proceed with the left knee at this time. They have been treated conservatively in the past for the above stated problem and despite conservative measures, they continue to have progressive pain and severe functional limitations and dysfunction. They have failed non-operative management including home exercise, medications, and injections. It is felt that they would benefit from undergoing total joint replacement. Risks and benefits of the procedure have been discussed with the patient and they elect to proceed with surgery. There are no active contraindications to surgery such as ongoing infection or rapidly progressive neurological  disease.  Problem List/Past Medical  Primary osteoarthritis of one knee, left (M17.12)  S/P Right total knee arthroplasty (V43.65)  Hypercholesterolemia  High blood pressure  Hypothyroidism  Skin Cancer  Squamous Cell Left Leg Complete Heart Block  Atrioventricular Block, Complete  Menopause  Allergies No Known Drug Allergies  Family History Congestive Heart Failure  Father. Cancer  Mother.  Social History Alcohol use  current drinker; drinks wine; only occasionally per week Children  3 Current work status  retired Engineer, agricultural (Currently)  no Drug/Alcohol Rehab (Previously)  no Illicit drug use  no Living situation  Lives alone. Marital status  Widowed. Pain Contract  yes Tobacco / smoke exposure  no Tobacco use  Former smoker. former smoker; smoke(d) 1 pack(s) per day [place] with sister following the Left TKA performed on 03/14/2015. Advance Directives  Living Will, Healthcare POA  Medication History  Amoxicillin (500MG  Tablet, 4 tablet(s) Oral 1-2 hour prior to dental work, Taken starting 03/10/2014) Active. Aleve (220MG  Tablet, Oral) Active. Pravastatin Sodium (40MG  Tablet, Oral) Active. Aspirin EC (81MG  Tablet DR, Oral) Active. Synthroid (112MCG Tablet, Oral) Active. AmLODIPine Besylate (10MG  Tablet, Oral) Active. Lisinopril-Hydrochlorothiazide (20-25MG  Tablet, Oral) Active.  Past Surgical History Arthroscopy of Knee  bilateral Hysterectomy  complete (non-cancerous) Pacemaker Placement    Review of Systems General Not Present- Chills, Fatigue, Fever, Memory Loss, Night Sweats, Weight Gain and Weight Loss. Skin Not Present- Eczema, Hives, Itching, Lesions and Rash. HEENT Not Present- Dentures, Double Vision, Headache, Hearing Loss, Tinnitus and Visual Loss. Respiratory Not Present- Allergies, Chronic Cough, Coughing up blood, Shortness of breath at rest and Shortness  of breath with  exertion. Cardiovascular Not Present- Chest Pain, Difficulty Breathing Lying Down, Murmur, Palpitations, Racing/skipping heartbeats and Swelling. Gastrointestinal Not Present- Abdominal Pain, Bloody Stool, Constipation, Diarrhea, Difficulty Swallowing, Heartburn, Jaundice, Loss of appetitie, Nausea and Vomiting. Female Genitourinary Not Present- Blood in Urine, Discharge, Flank Pain, Incontinence, Painful Urination, Urgency, Urinary frequency, Urinary Retention, Urinating at Night and Weak urinary stream. Musculoskeletal Not Present- Back Pain, Joint Pain, Joint Swelling, Morning Stiffness, Muscle Pain, Muscle Weakness and Spasms. Neurological Not Present- Blackout spells, Difficulty with balance, Dizziness, Paralysis, Tremor and Weakness. Psychiatric Not Present- Insomnia.  Vitals Weight: 170 lb Height: 63in Weight was reported by patient. Height was reported by patient. Body Surface Area: 1.8 m Body Mass Index: 30.11 kg/m  Pulse: 76 (Regular)  BP: 128/68 (Sitting, Right Arm, Standard)  Physical Exam General Mental Status -Alert, cooperative and good historian. General Appearance-pleasant, Not in acute distress. Orientation-Oriented X3. Build & Nutrition-Well nourished and Well developed.  Head and Neck Head-normocephalic, atraumatic . Neck Global Assessment - supple, no bruit auscultated on the right, no bruit auscultated on the left.  Eye Vision-Wears corrective lenses. Pupil - Bilateral-Regular and Round. Motion - Bilateral-EOMI.  Chest and Lung Exam Auscultation Breath sounds - clear at anterior chest wall and clear at posterior chest wall. Adventitious sounds - No Adventitious sounds.  Cardiovascular Auscultation Rhythm - Regular rate and rhythm. Heart Sounds - S1 WNL and S2 WNL. Murmurs & Other Heart Sounds - Auscultation of the heart reveals - No Murmurs.  Abdomen Palpation/Percussion Tenderness - Abdomen is non-tender to palpation.  Rigidity (guarding) - Abdomen is soft. Auscultation Auscultation of the abdomen reveals - Bowel sounds normal.  Female Genitourinary Note: Not done, not pertinent to present illness   Musculoskeletal Note: On physical exam, she is alert and oriented. She is in no acute distress. She is tender along the medial and lateral joint lines. No effusion or instability noted. Distal pulses 2+. Sensation and motor function intact to lower extremities.  Assessment & Plan  Primary osteoarthritis of one knee, left (Principal Diagnosis) (M17.12) S/P Right total knee arthroplasty (V43.65)  Note:Surgical Plans: Left Total Knee Replacement  Disposition: Home with sister  PCP: Dr. Brigitte Pulse - Patient has been seen preoperatively and felt to be stable for surgery. Cards: Dr. Lovena Le - Patient has been seen preoperatively and felt to be stable for surgery.  IV TXA  Anesthesia Issues: None   Signed electronically by Ok Edwards, III PA-C

## 2015-03-14 ENCOUNTER — Inpatient Hospital Stay (HOSPITAL_COMMUNITY): Payer: Medicare Other | Admitting: Certified Registered Nurse Anesthetist

## 2015-03-14 ENCOUNTER — Encounter (HOSPITAL_COMMUNITY): Payer: Self-pay | Admitting: *Deleted

## 2015-03-14 ENCOUNTER — Encounter (HOSPITAL_COMMUNITY)
Admission: AD | Disposition: A | Discharge status: Home Health | Payer: Self-pay | Source: Ambulatory Visit | Attending: Orthopedic Surgery

## 2015-03-14 ENCOUNTER — Inpatient Hospital Stay (HOSPITAL_COMMUNITY)
Admission: AD | Admit: 2015-03-14 | Discharge: 2015-03-16 | DRG: 470 | Disposition: A | Discharge status: Home Health | Payer: Medicare Other | Source: Ambulatory Visit | Attending: Orthopedic Surgery | Admitting: Orthopedic Surgery

## 2015-03-14 DIAGNOSIS — Z79899 Other long term (current) drug therapy: Secondary | ICD-10-CM | POA: Diagnosis not present

## 2015-03-14 DIAGNOSIS — E039 Hypothyroidism, unspecified: Secondary | ICD-10-CM | POA: Diagnosis present

## 2015-03-14 DIAGNOSIS — E7800 Pure hypercholesterolemia, unspecified: Secondary | ICD-10-CM | POA: Diagnosis not present

## 2015-03-14 DIAGNOSIS — Z01812 Encounter for preprocedural laboratory examination: Secondary | ICD-10-CM

## 2015-03-14 DIAGNOSIS — M1710 Unilateral primary osteoarthritis, unspecified knee: Secondary | ICD-10-CM | POA: Diagnosis present

## 2015-03-14 DIAGNOSIS — M1712 Unilateral primary osteoarthritis, left knee: Principal | ICD-10-CM | POA: Diagnosis present

## 2015-03-14 DIAGNOSIS — M25562 Pain in left knee: Secondary | ICD-10-CM | POA: Diagnosis present

## 2015-03-14 DIAGNOSIS — M179 Osteoarthritis of knee, unspecified: Secondary | ICD-10-CM | POA: Diagnosis present

## 2015-03-14 DIAGNOSIS — Z950 Presence of cardiac pacemaker: Secondary | ICD-10-CM | POA: Diagnosis not present

## 2015-03-14 DIAGNOSIS — Z7982 Long term (current) use of aspirin: Secondary | ICD-10-CM

## 2015-03-14 HISTORY — PX: TOTAL KNEE ARTHROPLASTY: SHX125

## 2015-03-14 LAB — TYPE AND SCREEN
ABO/RH(D): O POS
Antibody Screen: NEGATIVE

## 2015-03-14 SURGERY — ARTHROPLASTY, KNEE, TOTAL
Anesthesia: Spinal | Site: Knee | Laterality: Left

## 2015-03-14 MED ORDER — FENTANYL CITRATE (PF) 100 MCG/2ML IJ SOLN
INTRAMUSCULAR | Status: DC | PRN
  Administered 2015-03-14: 50 ug via INTRAVENOUS

## 2015-03-14 MED ORDER — BUPIVACAINE LIPOSOME 1.3 % IJ SUSP
INTRAMUSCULAR | Status: DC | PRN
  Administered 2015-03-14: 20 mL

## 2015-03-14 MED ORDER — TRAMADOL HCL 50 MG PO TABS
50.0000 mg | ORAL_TABLET | Freq: Four times a day (QID) | ORAL | Status: DC | PRN
  Administered 2015-03-14: 100 mg via ORAL
  Administered 2015-03-15: 50 mg via ORAL
  Filled 2015-03-14: qty 2
  Filled 2015-03-14: qty 1

## 2015-03-14 MED ORDER — FLEET ENEMA 7-19 GM/118ML RE ENEM: 1.0000 | ENEMA | Freq: Once | RECTAL | Status: DC | PRN

## 2015-03-14 MED ORDER — AMLODIPINE BESYLATE 10 MG PO TABS
10.0000 mg | ORAL_TABLET | Freq: Every day | ORAL | Status: DC
  Administered 2015-03-15 – 2015-03-16 (×2): 10 mg via ORAL
  Filled 2015-03-14 (×2): qty 1

## 2015-03-14 MED ORDER — ACETAMINOPHEN 10 MG/ML IV SOLN
1000.0000 mg | Freq: Once | INTRAVENOUS | Status: AC
  Administered 2015-03-14: 1000 mg via INTRAVENOUS
  Filled 2015-03-14: qty 100

## 2015-03-14 MED ORDER — METHOCARBAMOL 500 MG PO TABS
500.0000 mg | ORAL_TABLET | Freq: Four times a day (QID) | ORAL | Status: DC | PRN
  Administered 2015-03-15 – 2015-03-16 (×5): 500 mg via ORAL
  Filled 2015-03-14 (×5): qty 1

## 2015-03-14 MED ORDER — DEXAMETHASONE SODIUM PHOSPHATE 10 MG/ML IJ SOLN
10.0000 mg | Freq: Once | INTRAMUSCULAR | Status: DC
  Filled 2015-03-14: qty 1

## 2015-03-14 MED ORDER — CEFAZOLIN SODIUM-DEXTROSE 2-3 GM-% IV SOLR
2.0000 g | Freq: Four times a day (QID) | INTRAVENOUS | Status: AC
  Administered 2015-03-14 (×2): 2 g via INTRAVENOUS
  Filled 2015-03-14 (×2): qty 50

## 2015-03-14 MED ORDER — RIVAROXABAN 10 MG PO TABS
10.0000 mg | ORAL_TABLET | Freq: Every day | ORAL | Status: DC
  Administered 2015-03-15 – 2015-03-16 (×2): 10 mg via ORAL
  Filled 2015-03-14 (×3): qty 1

## 2015-03-14 MED ORDER — TRANEXAMIC ACID 1000 MG/10ML IV SOLN
1000.0000 mg | Freq: Once | INTRAVENOUS | Status: AC
  Administered 2015-03-14: 1000 mg via INTRAVENOUS
  Filled 2015-03-14: qty 10

## 2015-03-14 MED ORDER — ACETAMINOPHEN 650 MG RE SUPP: 650.0000 mg | Freq: Four times a day (QID) | RECTAL | Status: DC | PRN

## 2015-03-14 MED ORDER — MIDAZOLAM HCL 5 MG/5ML IJ SOLN
INTRAMUSCULAR | Status: DC | PRN
  Administered 2015-03-14: 2 mg via INTRAVENOUS

## 2015-03-14 MED ORDER — PROPOFOL 500 MG/50ML IV EMUL
INTRAVENOUS | Status: DC | PRN
  Administered 2015-03-14: 75 ug/kg/min via INTRAVENOUS

## 2015-03-14 MED ORDER — PHENOL 1.4 % MT LIQD: 1.0000 | OROMUCOSAL | Status: DC | PRN

## 2015-03-14 MED ORDER — BISACODYL 10 MG RE SUPP: 10.0000 mg | Freq: Every day | RECTAL | Status: DC | PRN

## 2015-03-14 MED ORDER — PROMETHAZINE HCL 25 MG/ML IJ SOLN: 6.2500 mg | INTRAMUSCULAR | Status: DC | PRN

## 2015-03-14 MED ORDER — ONDANSETRON HCL 4 MG/2ML IJ SOLN: 4.0000 mg | Freq: Four times a day (QID) | INTRAMUSCULAR | Status: DC | PRN

## 2015-03-14 MED ORDER — PROPOFOL 10 MG/ML IV BOLUS
INTRAVENOUS | Status: AC
Start: 2015-03-14 — End: 2015-03-14
  Filled 2015-03-14: qty 60

## 2015-03-14 MED ORDER — BUPIVACAINE LIPOSOME 1.3 % IJ SUSP
20.0000 mL | Freq: Once | INTRAMUSCULAR | Status: DC
  Filled 2015-03-14: qty 20

## 2015-03-14 MED ORDER — BUPIVACAINE HCL (PF) 0.25 % IJ SOLN
INTRAMUSCULAR | Status: AC
  Filled 2015-03-14: qty 30

## 2015-03-14 MED ORDER — TRANEXAMIC ACID 1000 MG/10ML IV SOLN
1000.0000 mg | INTRAVENOUS | Status: DC
  Filled 2015-03-14: qty 10

## 2015-03-14 MED ORDER — SODIUM CHLORIDE 0.9 % IV SOLN
INTRAVENOUS | Status: DC
  Administered 2015-03-14 – 2015-03-15 (×2): via INTRAVENOUS

## 2015-03-14 MED ORDER — POLYETHYLENE GLYCOL 3350 17 G PO PACK: 17.0000 g | PACK | Freq: Every day | ORAL | Status: DC | PRN

## 2015-03-14 MED ORDER — OXYCODONE HCL 5 MG PO TABS
5.0000 mg | ORAL_TABLET | ORAL | Status: DC | PRN
  Administered 2015-03-14 – 2015-03-16 (×11): 10 mg via ORAL
  Filled 2015-03-14 (×11): qty 2

## 2015-03-14 MED ORDER — METOCLOPRAMIDE HCL 5 MG/ML IJ SOLN: 5.0000 mg | Freq: Three times a day (TID) | INTRAMUSCULAR | Status: DC | PRN

## 2015-03-14 MED ORDER — SODIUM CHLORIDE 0.9 % IJ SOLN
INTRAMUSCULAR | Status: AC
  Filled 2015-03-14: qty 50

## 2015-03-14 MED ORDER — ONDANSETRON HCL 4 MG PO TABS: 4.0000 mg | ORAL_TABLET | Freq: Four times a day (QID) | ORAL | Status: DC | PRN

## 2015-03-14 MED ORDER — CEFAZOLIN SODIUM-DEXTROSE 2-3 GM-% IV SOLR
2.0000 g | INTRAVENOUS | Status: AC
  Administered 2015-03-14: 2 g via INTRAVENOUS

## 2015-03-14 MED ORDER — DEXAMETHASONE SODIUM PHOSPHATE 10 MG/ML IJ SOLN
INTRAMUSCULAR | Status: AC
  Filled 2015-03-14: qty 1

## 2015-03-14 MED ORDER — CEFAZOLIN SODIUM-DEXTROSE 2-3 GM-% IV SOLR
INTRAVENOUS | Status: AC
  Filled 2015-03-14: qty 50

## 2015-03-14 MED ORDER — SODIUM CHLORIDE 0.9 % IV SOLN: INTRAVENOUS | Status: DC

## 2015-03-14 MED ORDER — HYDROCODONE-ACETAMINOPHEN 7.5-325 MG PO TABS: 1.0000 | ORAL_TABLET | Freq: Once | ORAL | Status: DC | PRN

## 2015-03-14 MED ORDER — HYDROMORPHONE HCL 1 MG/ML IJ SOLN: 0.2500 mg | INTRAMUSCULAR | Status: DC | PRN

## 2015-03-14 MED ORDER — PRAVASTATIN SODIUM 40 MG PO TABS
40.0000 mg | ORAL_TABLET | Freq: Every day | ORAL | Status: DC
  Administered 2015-03-15 – 2015-03-16 (×2): 40 mg via ORAL
  Filled 2015-03-14 (×2): qty 1

## 2015-03-14 MED ORDER — ACETAMINOPHEN 500 MG PO TABS
1000.0000 mg | ORAL_TABLET | Freq: Four times a day (QID) | ORAL | Status: AC
  Administered 2015-03-14 – 2015-03-15 (×4): 1000 mg via ORAL
  Filled 2015-03-14 (×4): qty 2

## 2015-03-14 MED ORDER — DEXAMETHASONE SODIUM PHOSPHATE 10 MG/ML IJ SOLN
10.0000 mg | Freq: Once | INTRAMUSCULAR | Status: AC
Start: 2015-03-14 — End: 2015-03-14
  Administered 2015-03-14: 10 mg via INTRAVENOUS

## 2015-03-14 MED ORDER — SODIUM CHLORIDE 0.9 % IJ SOLN
INTRAMUSCULAR | Status: DC | PRN
  Administered 2015-03-14: 30 mL

## 2015-03-14 MED ORDER — METOCLOPRAMIDE HCL 5 MG PO TABS
5.0000 mg | ORAL_TABLET | Freq: Three times a day (TID) | ORAL | Status: DC | PRN
  Filled 2015-03-14: qty 2

## 2015-03-14 MED ORDER — LEVOTHYROXINE SODIUM 112 MCG PO TABS
112.0000 ug | ORAL_TABLET | Freq: Every day | ORAL | Status: DC
  Administered 2015-03-15 – 2015-03-16 (×2): 112 ug via ORAL
  Filled 2015-03-14 (×3): qty 1

## 2015-03-14 MED ORDER — ACETAMINOPHEN 10 MG/ML IV SOLN
INTRAVENOUS | Status: AC
  Filled 2015-03-14: qty 100

## 2015-03-14 MED ORDER — LACTATED RINGERS IV SOLN
INTRAVENOUS | Status: DC
  Administered 2015-03-14: 1000 mL via INTRAVENOUS

## 2015-03-14 MED ORDER — FENTANYL CITRATE (PF) 100 MCG/2ML IJ SOLN
INTRAMUSCULAR | Status: AC
  Filled 2015-03-14: qty 2

## 2015-03-14 MED ORDER — METHOCARBAMOL 1000 MG/10ML IJ SOLN
500.0000 mg | Freq: Four times a day (QID) | INTRAVENOUS | Status: DC | PRN
  Administered 2015-03-14: 500 mg via INTRAVENOUS
  Filled 2015-03-14 (×2): qty 5

## 2015-03-14 MED ORDER — BUPIVACAINE HCL 0.25 % IJ SOLN
INTRAMUSCULAR | Status: DC | PRN
  Administered 2015-03-14: 20 mL

## 2015-03-14 MED ORDER — CHLORHEXIDINE GLUCONATE 4 % EX LIQD: 60.0000 mL | Freq: Once | CUTANEOUS | Status: DC

## 2015-03-14 MED ORDER — MENTHOL 3 MG MT LOZG: 1.0000 | LOZENGE | OROMUCOSAL | Status: DC | PRN

## 2015-03-14 MED ORDER — DOCUSATE SODIUM 100 MG PO CAPS
100.0000 mg | ORAL_CAPSULE | Freq: Two times a day (BID) | ORAL | Status: DC
  Administered 2015-03-14 – 2015-03-16 (×4): 100 mg via ORAL

## 2015-03-14 MED ORDER — ACETAMINOPHEN 325 MG PO TABS: 650.0000 mg | ORAL_TABLET | Freq: Four times a day (QID) | ORAL | Status: DC | PRN

## 2015-03-14 MED ORDER — ONDANSETRON HCL 4 MG/2ML IJ SOLN
INTRAMUSCULAR | Status: DC | PRN
  Administered 2015-03-14: 4 mg via INTRAVENOUS

## 2015-03-14 MED ORDER — DIPHENHYDRAMINE HCL 12.5 MG/5ML PO ELIX: 12.5000 mg | ORAL_SOLUTION | ORAL | Status: DC | PRN

## 2015-03-14 SURGICAL SUPPLY — 50 items
BAG DECANTER FOR FLEXI CONT (MISCELLANEOUS) ×1 IMPLANT
BAG SPEC THK2 15X12 ZIP CLS (MISCELLANEOUS) ×1
BAG ZIPLOCK 12X15 (MISCELLANEOUS) ×2 IMPLANT
BANDAGE ACE 6X5 VEL STRL LF (GAUZE/BANDAGES/DRESSINGS) ×2 IMPLANT
BLADE SAG 18X100X1.27 (BLADE) ×2 IMPLANT
BLADE SAW SGTL 11.0X1.19X90.0M (BLADE) ×2 IMPLANT
BOWL SMART MIX CTS (DISPOSABLE) ×2 IMPLANT
CAP KNEE TOTAL 3 SIGMA ×1 IMPLANT
CEMENT HV SMART SET (Cement) ×4 IMPLANT
CLOTH BEACON ORANGE TIMEOUT ST (SAFETY) ×2 IMPLANT
CUFF TOURN SGL QUICK 34 (TOURNIQUET CUFF) ×2
CUFF TRNQT CYL 34X4X40X1 (TOURNIQUET CUFF) ×1 IMPLANT
DECANTER SPIKE VIAL GLASS SM (MISCELLANEOUS) ×2 IMPLANT
DRAPE U-SHAPE 47X51 STRL (DRAPES) ×2 IMPLANT
DRSG ADAPTIC 3X8 NADH LF (GAUZE/BANDAGES/DRESSINGS) ×2 IMPLANT
DRSG PAD ABDOMINAL 8X10 ST (GAUZE/BANDAGES/DRESSINGS) ×2 IMPLANT
DURAPREP 26ML APPLICATOR (WOUND CARE) ×2 IMPLANT
ELECT REM PT RETURN 9FT ADLT (ELECTROSURGICAL) ×2
ELECTRODE REM PT RTRN 9FT ADLT (ELECTROSURGICAL) ×1 IMPLANT
EVACUATOR 1/8 PVC DRAIN (DRAIN) ×2 IMPLANT
GAUZE SPONGE 4X4 12PLY STRL (GAUZE/BANDAGES/DRESSINGS) ×2 IMPLANT
GLOVE BIO SURGEON STRL SZ7.5 (GLOVE) IMPLANT
GLOVE BIO SURGEON STRL SZ8 (GLOVE) ×2 IMPLANT
GLOVE BIOGEL PI IND STRL 6.5 (GLOVE) IMPLANT
GLOVE BIOGEL PI IND STRL 8 (GLOVE) ×1 IMPLANT
GLOVE BIOGEL PI INDICATOR 6.5 (GLOVE) ×1
GLOVE BIOGEL PI INDICATOR 8 (GLOVE) ×1
GLOVE SURG SS PI 6.5 STRL IVOR (GLOVE) ×1 IMPLANT
GOWN STRL REUS W/TWL LRG LVL3 (GOWN DISPOSABLE) ×3 IMPLANT
GOWN STRL REUS W/TWL XL LVL3 (GOWN DISPOSABLE) IMPLANT
HANDPIECE INTERPULSE COAX TIP (DISPOSABLE) ×2
IMMOBILIZER KNEE 20 (SOFTGOODS) ×2
IMMOBILIZER KNEE 20 THIGH 36 (SOFTGOODS) ×1 IMPLANT
MANIFOLD NEPTUNE II (INSTRUMENTS) ×2 IMPLANT
NS IRRIG 1000ML POUR BTL (IV SOLUTION) ×2 IMPLANT
PACK TOTAL KNEE CUSTOM (KITS) ×2 IMPLANT
PADDING CAST COTTON 6X4 STRL (CAST SUPPLIES) ×5 IMPLANT
POSITIONER SURGICAL ARM (MISCELLANEOUS) ×2 IMPLANT
SET HNDPC FAN SPRY TIP SCT (DISPOSABLE) ×1 IMPLANT
STRIP CLOSURE SKIN 1/2X4 (GAUZE/BANDAGES/DRESSINGS) ×4 IMPLANT
SUT MNCRL AB 4-0 PS2 18 (SUTURE) ×2 IMPLANT
SUT VIC AB 2-0 CT1 27 (SUTURE) ×6
SUT VIC AB 2-0 CT1 TAPERPNT 27 (SUTURE) ×3 IMPLANT
SUT VLOC 180 0 24IN GS25 (SUTURE) ×2 IMPLANT
SYR 50ML LL SCALE MARK (SYRINGE) ×2 IMPLANT
TRAY FOLEY W/METER SILVER 14FR (SET/KITS/TRAYS/PACK) ×2 IMPLANT
TRAY FOLEY W/METER SILVER 16FR (SET/KITS/TRAYS/PACK) ×1 IMPLANT
WATER STERILE IRR 1500ML POUR (IV SOLUTION) ×2 IMPLANT
WRAP KNEE MAXI GEL POST OP (GAUZE/BANDAGES/DRESSINGS) ×2 IMPLANT
YANKAUER SUCT BULB TIP 10FT TU (MISCELLANEOUS) ×2 IMPLANT

## 2015-03-14 NOTE — Anesthesia Postprocedure Evaluation (Signed)
Anesthesia Post Note  Patient: [name]  Procedure(s) Performed: Procedure(s) (LRB): TOTAL KNEE ARTHROPLASTY (Left)  Patient location during evaluation: PACU Anesthesia Type: Spinal Level of consciousness: oriented and awake and alert Pain management: pain level controlled Vital Signs Assessment: post-procedure vital signs reviewed and stable Respiratory status: spontaneous breathing, respiratory function stable and patient connected to nasal cannula oxygen Cardiovascular status: blood pressure returned to baseline and stable Postop Assessment: no headache, no backache and spinal receding Anesthetic complications: no    Last Vitals:  Filed Vitals:   03/14/15 1115 03/14/15 1133  BP: 115/42 120/84  Pulse: 70 75  Temp: 36.6 C 36.7 C  Resp: 18 16    Last Pain:  Filed Vitals:   03/14/15 1134  PainSc: 0-No pain                 Tiajuana Amass

## 2015-03-14 NOTE — Interval H&P Note (Signed)
History and Physical Interval Note:  03/14/2015 6:43 AM  [name]  has presented today for surgery, with the diagnosis of OA LEFT KNEE  The various methods of treatment have been discussed with the patient and family. After consideration of risks, benefits and other options for treatment, the patient has consented to  Procedure(s): TOTAL KNEE ARTHROPLASTY (Left) as a surgical intervention .  The patient's history has been reviewed, patient examined, no change in status, stable for surgery.  I have reviewed the patient's chart and labs.  Questions were answered to the patient's satisfaction.     Gearlean Alf

## 2015-03-14 NOTE — Anesthesia Procedure Notes (Signed)
Spinal Patient location during procedure: OR Staffing Anesthesiologist: Suzette Battiest Performed by: anesthesiologist  Preanesthetic Checklist Completed: patient identified, site marked, surgical consent, pre-op evaluation, timeout performed, IV checked, risks and benefits discussed and monitors and equipment checked Spinal Block Patient position: sitting Prep: Betadine and site prepped and draped Patient monitoring: continuous pulse ox, blood pressure and heart rate Approach: midline Location: L4-5 Injection technique: single-shot Needle Needle type: Quincke  Needle gauge: 22 G Needle length: 9 cm Additional Notes Risks and benefits discussed. Pt tolerated well with no immediate complications. CSF return before and after injection.

## 2015-03-14 NOTE — Evaluation (Signed)
Physical Therapy Evaluation Patient Details Name: SAHILY BUNDY MRN: [id number] DOB: [birth year] Today's Date: 03/14/2015   History of Present Illness  75 yo female s/p L TKA 03/14/15  Clinical Impression  On eval, POD 0, pt required Min assist for mobility-walked ~40 feet with RW. Pain rated 5/10 with activity. Will follow and progress activity as tolerated.     Follow Up Recommendations Home health PT    Equipment Recommendations  None recommended by PT    Recommendations for Other Services       Precautions / Restrictions Precautions Precautions: Knee;Fall Required Braces or Orthoses: Knee Immobilizer - Left Knee Immobilizer - Left: Discontinue once straight leg raise with < 10 degree lag Restrictions Weight Bearing Restrictions: No LLE Weight Bearing: Weight bearing as tolerated      Mobility  Bed Mobility Overal bed mobility: Needs Assistance Bed Mobility: Supine to Sit     Supine to sit: Min assist     General bed mobility comments: Asssit for L LE  Transfers Overall transfer level: Needs assistance Equipment used: Rolling walker (2 wheeled) Transfers: Sit to/from Stand Sit to Stand: Min assist         General transfer comment: VCs safety, technique, hand/LE placement. Assist to rise, stabilize, control descent.   Ambulation/Gait Ambulation/Gait assistance: Min assist Ambulation Distance (Feet): 40 Feet Assistive device: Rolling walker (2 wheeled) Gait Pattern/deviations: Step-to pattern     General Gait Details: VCs safety, technique, sequence. Assist to stabilize.   Stairs            Wheelchair Mobility    Modified Rankin (Stroke Patients Only)       Balance                                             Pertinent Vitals/Pain Pain Assessment: 0-10 Pain Score: 5  Pain Location: L knee Pain Descriptors / Indicators: Aching;Sore Pain Intervention(s): Monitored during session;Repositioned;Ice applied    Home  Living                        Prior Function                 Hand Dominance        Extremity/Trunk Assessment               Lower Extremity Assessment: LLE deficits/detail   LLE Deficits / Details: moves ankle well. hip flex 2/5.  Cervical / Trunk Assessment: Normal  Communication      Cognition Arousal/Alertness: Awake/alert Behavior During Therapy: WFL for tasks assessed/performed Overall Cognitive Status: Within Functional Limits for tasks assessed                      General Comments      Exercises        Assessment/Plan    PT Assessment Patient needs continued PT services  PT Diagnosis Difficulty walking;Acute pain   PT Problem List Decreased strength;Decreased range of motion;Decreased activity tolerance;Decreased balance;Decreased mobility;Decreased knowledge of use of DME;Pain  PT Treatment Interventions DME instruction;Gait training;Functional mobility training;Therapeutic activities;Patient/family education;Balance training;Therapeutic exercise   PT Goals (Current goals can be found in the Care Plan section) Acute Rehab PT Goals Patient Stated Goal: home soon PT Goal Formulation: With patient Time For Goal Achievement: 03/21/15 Potential to Achieve Goals: Good    Frequency  7X/week   Barriers to discharge        Co-evaluation               End of Session Equipment Utilized During Treatment: Left knee immobilizer Activity Tolerance: Patient tolerated treatment well Patient left: in chair;with call bell/phone within reach;with family/visitor present           Time: [id number] PT Time Calculation (min) (ACUTE ONLY): 11 min   Charges:   PT Evaluation $PT Eval Low Complexity: 1 Procedure     PT G Codes:        Weston Anna, MPT Pager: [phone]

## 2015-03-14 NOTE — Op Note (Signed)
Pre-operative diagnosis- Osteoarthritis  Left knee(s)  Post-operative diagnosis- Osteoarthritis Left knee(s)  Procedure-  Left  Total Knee Arthroplasty  Surgeon- Dione Plover. Viola Placeres, MD  Assistant- Ardeen Jourdain, PA-C   Anesthesia-  Spinal  EBL-* No blood loss amount entered *   Drains Hemovac  Tourniquet time-  Total Tourniquet Time Documented: Thigh (Left) - 33 minutes Total: Thigh (Left) - 33 minutes     Complications- None  Condition-PACU - hemodynamically stable.   Brief Clinical Note  [name] is a 75 y.o. year old female with end stage OA of her left knee with progressively worsening pain and dysfunction. She has constant pain, with activity and at rest and significant functional deficits with difficulties even with ADLs. She has had extensive non-op management including analgesics, injections of cortisone and viscosupplements, and home exercise program, but remains in significant pain with significant dysfunction. Radiographs show bone on bone arthritis medial and patellofemoral. She presents now for left Total Knee Arthroplasty.    Procedure in detail---   The patient is brought into the operating room and positioned supine on the operating table. After successful administration of  Spinal,   a tourniquet is placed high on the  Left thigh(s) and the lower extremity is prepped and draped in the usual sterile fashion. Time out is performed by the operating team and then the  Left lower extremity is wrapped in Esmarch, knee flexed and the tourniquet inflated to 300 mmHg.       A midline incision is made with a ten blade through the subcutaneous tissue to the level of the extensor mechanism. A fresh blade is used to make a medial parapatellar arthrotomy. Soft tissue over the proximal medial tibia is subperiosteally elevated to the joint line with a knife and into the semimembranosus bursa with a Cobb elevator. Soft tissue over the proximal lateral tibia is elevated with  attention being paid to avoiding the patellar tendon on the tibial tubercle. The patella is everted, knee flexed 90 degrees and the ACL and PCL are removed. Findings are bone on bone medial and patellofemoral with large global osteophytes.        The drill is used to create a starting hole in the distal femur and the canal is thoroughly irrigated with sterile saline to remove the fatty contents. The 5 degree Left  valgus alignment guide is placed into the femoral canal and the distal femoral cutting block is pinned to remove 10 mm off the distal femur. Resection is made with an oscillating saw.      The tibia is subluxed forward and the menisci are removed. The extramedullary alignment guide is placed referencing proximally at the medial aspect of the tibial tubercle and distally along the second metatarsal axis and tibial crest. The block is pinned to remove 80mm off the more deficient medial  side. Resection is made with an oscillating saw. Size 2.5is the most appropriate size for the tibia and the proximal tibia is prepared with the modular drill and keel punch for that size.      The femoral sizing guide is placed and size 2.5 is most appropriate. Rotation is marked off the epicondylar axis and confirmed by creating a rectangular flexion gap at 90 degrees. The size 2.5 cutting block is pinned in this rotation and the anterior, posterior and chamfer cuts are made with the oscillating saw. The intercondylar block is then placed and that cut is made.      Trial size 2.5 tibial component,  trial size 2.5 posterior stabilized femur and a 10  mm posterior stabilized rotating platform insert trial is placed. Full extension is achieved with excellent varus/valgus and anterior/posterior balance throughout full range of motion. The patella is everted and thickness measured to be 22  mm. Free hand resection is taken to 12 mm, a 35 template is placed, lug holes are drilled, trial patella is placed, and it tracks normally.  Osteophytes are removed off the posterior femur with the trial in place. All trials are removed and the cut bone surfaces prepared with pulsatile lavage. Cement is mixed and once ready for implantation, the size 2.5 tibial implant, size  2.5 posterior stabilized femoral component, and the size 35 patella are cemented in place and the patella is held with the clamp. The trial insert is placed and the knee held in full extension. The Exparel (20 ml mixed with 30 ml saline) and .25% Bupivicaine, are injected into the extensor mechanism, posterior capsule, medial and lateral gutters and subcutaneous tissues.  All extruded cement is removed and once the cement is hard the permanent 10 mm posterior stabilized rotating platform insert is placed into the tibial tray.      The wound is copiously irrigated with saline solution and the extensor mechanism closed over a hemovac drain with #1 V-loc suture. The tourniquet is released for a total tourniquet time of 33  minutes. Flexion against gravity is 140 degrees and the patella tracks normally. Subcutaneous tissue is closed with 2.0 vicryl and subcuticular with running 4.0 Monocryl. The incision is cleaned and dried and steri-strips and a bulky sterile dressing are applied. The limb is placed into a knee immobilizer and the patient is awakened and transported to recovery in stable condition.      Please note that a surgical assistant was a medical necessity for this procedure in order to perform it in a safe and expeditious manner. Surgical assistant was necessary to retract the ligaments and vital neurovascular structures to prevent injury to them and also necessary for proper positioning of the limb to allow for anatomic placement of the prosthesis.   Dione Plover Gael Delude, MD    03/14/2015, 10:08 AM

## 2015-03-14 NOTE — Anesthesia Preprocedure Evaluation (Addendum)
Anesthesia Evaluation  Patient identified by MRN, date of birth, ID band Patient awake    Reviewed: Allergy & Precautions, NPO status , Patient's Chart, lab work & pertinent test results  Airway Mallampati: II  TM Distance: >3 FB Neck ROM: Full    Dental   Pulmonary former smoker,    breath sounds clear to auscultation       Cardiovascular hypertension, Pt. on medications + dysrhythmias (complete heart block) + pacemaker  Rhythm:Regular Rate:Normal     Neuro/Psych negative neurological ROS     GI/Hepatic negative GI ROS, Neg liver ROS,   Endo/Other  negative endocrine ROS  Renal/GU negative Renal ROS     Musculoskeletal  (+) Arthritis ,   Abdominal   Peds  Hematology negative hematology ROS (+)   Anesthesia Other Findings   Reproductive/Obstetrics                            Lab Results  Component Value Date   WBC 7.3 03/07/2015   HGB 14.4 03/07/2015   HCT 45.3 03/07/2015   MCV 93.8 03/07/2015   PLT 250 03/07/2015   Lab Results  Component Value Date   CREATININE 1.04* 03/07/2015   BUN 22* 03/07/2015   NA 143 03/07/2015   K 5.8* 03/07/2015   CL 106 03/07/2015   CO2 27 03/07/2015   Lab Results  Component Value Date   INR 1.11 03/07/2015   INR 0.98 10/27/2010    Anesthesia Physical Anesthesia Plan  ASA: III  Anesthesia Plan: Spinal   Post-op Pain Management:    Induction: Intravenous  Airway Management Planned: Natural Airway and Simple Face Mask  Additional Equipment:   Intra-op Plan:   Post-operative Plan:   Informed Consent: I have reviewed the patients History and Physical, chart, labs and discussed the procedure including the risks, benefits and alternatives for the proposed anesthesia with the patient or authorized representative who has indicated his/her understanding and acceptance.     Plan Discussed with: CRNA  Anesthesia Plan Comments:         Anesthesia Quick Evaluation

## 2015-03-14 NOTE — Transfer of Care (Signed)
Immediate Anesthesia Transfer of Care Note  Patient: [name]  Procedure(s) Performed: Procedure(s): TOTAL KNEE ARTHROPLASTY (Left)  Patient Location: PACU  Anesthesia Type:Spinal  Level of Consciousness:  sedated, patient cooperative and responds to stimulation  Airway & Oxygen Therapy:Patient Spontanous Breathing and Patient connected to face mask oxgen  Post-op Assessment:  Report given to PACU RN and Post -op Vital signs reviewed and stable  Post vital signs:  Reviewed and stable  Last Vitals:  Filed Vitals:   03/14/15 0620  BP: 117/33  Pulse: 103  Temp: 36.3 C  Resp: 18    Complications: No apparent anesthesia complications L1 level on exam denied pain on assessment.

## 2015-03-14 NOTE — Progress Notes (Signed)
Utilization review completed.  

## 2015-03-14 NOTE — H&P (View-Only) (Signed)
[name] DOB: April 28, 1940 Married / Language: English / Race: White Female Date of Admission:  03/14/2015 CC:  Left Knee Pain History of Present Illness  The patient is a 75 year old female who comes in for a preoperative History and Physical. The patient is scheduled for a left total knee arthroplasty to be performed by Dr. Dione Plover. Aluisio, MD at [facility] on 03-14-2015. The patient is being followed for their left knee pain and osteoarthritis. Symptoms reported include: pain, aching, throbbing and stiffness, while the patient does not report symptoms of: swelling. [name] comes in for left knee pain. She does have osteoarthritis in that left knee. She is aware that it is bone on bone. She has had cortisone injections in the past which have given her relief for a couple of months. She does have the right knee replaced and she has done very well over the past couple of years. She has had no injury to the knee. The pain continues to be mostly medial. No locking or catching. There is minimal swelling to the knee. She has been treated conservatively utilizing cortisone injections for some time but is now at a point where she would like to proceed with surgical intervention for the left knee. She has had the right knee replaced and now ready to proceed with the left knee at this time. They have been treated conservatively in the past for the above stated problem and despite conservative measures, they continue to have progressive pain and severe functional limitations and dysfunction. They have failed non-operative management including home exercise, medications, and injections. It is felt that they would benefit from undergoing total joint replacement. Risks and benefits of the procedure have been discussed with the patient and they elect to proceed with surgery. There are no active contraindications to surgery such as ongoing infection or rapidly progressive neurological  disease.  Problem List/Past Medical  Primary osteoarthritis of one knee, left (M17.12)  S/P Right total knee arthroplasty (V43.65)  Hypercholesterolemia  High blood pressure  Hypothyroidism  Skin Cancer  Squamous Cell Left Leg Complete Heart Block  Atrioventricular Block, Complete  Menopause  Allergies No Known Drug Allergies  Family History Congestive Heart Failure  Father. Cancer  Mother.  Social History Alcohol use  current drinker; drinks wine; only occasionally per week Children  3 Current work status  retired Engineer, agricultural (Currently)  no Drug/Alcohol Rehab (Previously)  no Illicit drug use  no Living situation  Lives alone. Marital status  Widowed. Pain Contract  yes Tobacco / smoke exposure  no Tobacco use  Former smoker. former smoker; smoke(d) 1 pack(s) per day [name] with sister following the Left TKA performed on 03/14/2015. Advance Directives  Living Will, Healthcare POA  Medication History  Amoxicillin (500MG  Tablet, 4 tablet(s) Oral 1-2 hour prior to dental work, Taken starting 03/10/2014) Active. Aleve (220MG  Tablet, Oral) Active. Pravastatin Sodium (40MG  Tablet, Oral) Active. Aspirin EC (81MG  Tablet DR, Oral) Active. Synthroid (112MCG Tablet, Oral) Active. AmLODIPine Besylate (10MG  Tablet, Oral) Active. Lisinopril-Hydrochlorothiazide (20-25MG  Tablet, Oral) Active.  Past Surgical History Arthroscopy of Knee  bilateral Hysterectomy  complete (non-cancerous) Pacemaker Placement    Review of Systems General Not Present- Chills, Fatigue, Fever, Memory Loss, Night Sweats, Weight Gain and Weight Loss. Skin Not Present- Eczema, Hives, Itching, Lesions and Rash. HEENT Not Present- Dentures, Double Vision, Headache, Hearing Loss, Tinnitus and Visual Loss. Respiratory Not Present- Allergies, Chronic Cough, Coughing up blood, Shortness of breath at rest and Shortness  of breath with  exertion. Cardiovascular Not Present- Chest Pain, Difficulty Breathing Lying Down, Murmur, Palpitations, Racing/skipping heartbeats and Swelling. Gastrointestinal Not Present- Abdominal Pain, Bloody Stool, Constipation, Diarrhea, Difficulty Swallowing, Heartburn, Jaundice, Loss of appetitie, Nausea and Vomiting. Female Genitourinary Not Present- Blood in Urine, Discharge, Flank Pain, Incontinence, Painful Urination, Urgency, Urinary frequency, Urinary Retention, Urinating at Night and Weak urinary stream. Musculoskeletal Not Present- Back Pain, Joint Pain, Joint Swelling, Morning Stiffness, Muscle Pain, Muscle Weakness and Spasms. Neurological Not Present- Blackout spells, Difficulty with balance, Dizziness, Paralysis, Tremor and Weakness. Psychiatric Not Present- Insomnia.  Vitals Weight: 170 lb Height: 63in Weight was reported by patient. Height was reported by patient. Body Surface Area: 1.8 m Body Mass Index: 30.11 kg/m  Pulse: 76 (Regular)  BP: 128/68 (Sitting, Right Arm, Standard)  Physical Exam General Mental Status -Alert, cooperative and good historian. General Appearance-pleasant, Not in acute distress. Orientation-Oriented X3. Build & Nutrition-Well nourished and Well developed.  Head and Neck Head-normocephalic, atraumatic . Neck Global Assessment - supple, no bruit auscultated on the right, no bruit auscultated on the left.  Eye Vision-Wears corrective lenses. Pupil - Bilateral-Regular and Round. Motion - Bilateral-EOMI.  Chest and Lung Exam Auscultation Breath sounds - clear at anterior chest wall and clear at posterior chest wall. Adventitious sounds - No Adventitious sounds.  Cardiovascular Auscultation Rhythm - Regular rate and rhythm. Heart Sounds - S1 WNL and S2 WNL. Murmurs & Other Heart Sounds - Auscultation of the heart reveals - No Murmurs.  Abdomen Palpation/Percussion Tenderness - Abdomen is non-tender to palpation.  Rigidity (guarding) - Abdomen is soft. Auscultation Auscultation of the abdomen reveals - Bowel sounds normal.  Female Genitourinary Note: Not done, not pertinent to present illness   Musculoskeletal Note: On physical exam, she is alert and oriented. She is in no acute distress. She is tender along the medial and lateral joint lines. No effusion or instability noted. Distal pulses 2+. Sensation and motor function intact to lower extremities.  Assessment & Plan  Primary osteoarthritis of one knee, left (Principal Diagnosis) (M17.12) S/P Right total knee arthroplasty (V43.65)  Note:Surgical Plans: Left Total Knee Replacement  Disposition: Home with sister  PCP: Dr. Brigitte Pulse - Patient has been seen preoperatively and felt to be stable for surgery. Cards: Dr. Lovena Le - Patient has been seen preoperatively and felt to be stable for surgery.  IV TXA  Anesthesia Issues: None   Signed electronically by Ok Edwards, III PA-C

## 2015-03-15 LAB — CBC
HEMATOCRIT: 34.6 % — AB (ref 36.0–46.0)
HEMOGLOBIN: 11.3 g/dL — AB (ref 12.0–15.0)
MCH: 30.1 pg (ref 26.0–34.0)
MCHC: 32.7 g/dL (ref 30.0–36.0)
MCV: 92 fL (ref 78.0–100.0)
Platelets: 193 10*3/uL (ref 150–400)
RBC: 3.76 MIL/uL — AB (ref 3.87–5.11)
RDW: 13.4 % (ref 11.5–15.5)
WBC: 12.1 10*3/uL — AB (ref 4.0–10.5)

## 2015-03-15 LAB — BASIC METABOLIC PANEL
ANION GAP: 8 (ref 5–15)
BUN: 23 mg/dL — ABNORMAL HIGH (ref 6–20)
CALCIUM: 9 mg/dL (ref 8.9–10.3)
CHLORIDE: 107 mmol/L (ref 101–111)
CO2: 25 mmol/L (ref 22–32)
Creatinine, Ser: 0.98 mg/dL (ref 0.44–1.00)
GFR calc non Af Amer: 55 mL/min — ABNORMAL LOW (ref >60)
Glucose, Bld: 151 mg/dL — ABNORMAL HIGH (ref 65–99)
POTASSIUM: 4 mmol/L (ref 3.5–5.1)
Sodium: 140 mmol/L (ref 135–145)

## 2015-03-15 MED ORDER — METHOCARBAMOL 500 MG PO TABS: 500.0000 mg | ORAL_TABLET | Freq: Four times a day (QID) | ORAL | Status: DC | PRN

## 2015-03-15 MED ORDER — TRAMADOL HCL 50 MG PO TABS: 50.0000 mg | ORAL_TABLET | Freq: Four times a day (QID) | ORAL | Status: DC | PRN

## 2015-03-15 MED ORDER — OXYCODONE HCL 5 MG PO TABS: 5.0000 mg | ORAL_TABLET | ORAL | Status: DC | PRN

## 2015-03-15 MED ORDER — RIVAROXABAN 10 MG PO TABS: 10.0000 mg | ORAL_TABLET | Freq: Every day | ORAL | Status: DC

## 2015-03-15 NOTE — Progress Notes (Signed)
   Subjective: 1 Day Post-Op Procedure(s) (LRB): TOTAL KNEE ARTHROPLASTY (Left) Patient reports pain as mild.   Pain controlled. Patient seen in rounds with Dr. Wynelle Link. Patient is well, and has had no acute complaints or problems We will resume therapy today.  She walked 40 feet the day of surgery. Plan is to go Home after hospital stay.  Objective: Vital signs in last 24 hours: Temp:  [97.4 F (36.3 C)-99 F (37.2 C)] 98.3 F (36.8 C) (03/07 0546) Pulse Rate:  [65-82] 75 (03/07 0546) Resp:  [13-28] 18 (03/07 0546) BP: (103-127)/(42-84) 127/66 mmHg (03/07 0546) SpO2:  [92 %-100 %] 92 % (03/07 0546)  Intake/Output from previous day:  Intake/Output Summary (Last 24 hours) at 03/15/15 0902 Last data filed at 03/15/15 0626  Gross per 24 hour  Intake 3997.5 ml  Output   1700 ml  Net 2297.5 ml    Intake/Output this shift: UOP 750 since around [place]  Labs:  Recent Labs  03/15/15 0351  HGB 11.3*    Recent Labs  03/15/15 0351  WBC 12.1*  RBC 3.76*  HCT 34.6*  PLT 193    Recent Labs  03/15/15 0351  NA 140  K 4.0  CL 107  CO2 25  BUN 23*  CREATININE 0.98  GLUCOSE 151*  CALCIUM 9.0   No results for input(s): LABPT, INR in the last 72 hours.  EXAM General - Patient is Alert, Appropriate and Oriented Extremity - Neurovascular intact Sensation intact distally Dorsiflexion/Plantar flexion intact Dressing - dressing C/D/I Motor Function - intact, moving foot and toes well on exam.  Hemovac pulled without difficulty.  Past Medical History  Diagnosis Date  . CHB (complete heart block) ([place]) 2007  . Arthritis   . Postmenopausal   . Essential hypertension, benign   . Cardiac pacemaker in situ   . Atrioventricular block, complete ([place])   . Hyperlipidemia   . Thyroid disease     hypothyroid  . History of skin cancer   . Frequency of urination     Assessment/Plan: 1 Day Post-Op Procedure(s) (LRB): TOTAL KNEE ARTHROPLASTY (Left) Principal Problem:   OA  (osteoarthritis) of knee  Estimated body mass index is 31.38 kg/(m^2) as calculated from the following:   Height as of this encounter: 5' 3.5" (1.613 m).   Weight as of this encounter: 81.647 kg (180 lb). Advance diet Up with therapy Plan for discharge tomorrow Discharge home with home health    DVT Prophylaxis - Xarelto Weight-Bearing as tolerated to left leg D/C O2 and Pulse OX and try on Room Air  Arlee Muslim, PA-C Orthopaedic Surgery 03/15/2015, 9:02 AM

## 2015-03-15 NOTE — Discharge Instructions (Addendum)
° °Dr. Frank Aluisio °Total Joint Specialist °[facility] °[address] °[place], [place] [address] °[phone] ° °TOTAL KNEE REPLACEMENT POSTOPERATIVE DIRECTIONS ° °Knee Rehabilitation, Guidelines Following Surgery  °Results after knee surgery are often greatly improved when you follow the exercise, range of motion and muscle strengthening exercises prescribed by your doctor. Safety measures are also important to protect the knee from further injury. Any time any of these exercises cause you to have increased pain or swelling in your knee joint, decrease the amount until you are comfortable again and slowly increase them. If you have problems or questions, call your caregiver or physical therapist for advice.  ° °HOME CARE INSTRUCTIONS  °Remove items at home which could result in a fall. This includes throw rugs or furniture in walking pathways.  °· ICE to the affected knee every three hours for 30 minutes at a time and then as needed for pain and swelling.  Continue to use ice on the knee for pain and swelling from surgery. You may notice swelling that will progress down to the foot and ankle.  This is normal after surgery.  Elevate the leg when you are not up walking on it.   °· Continue to use the breathing machine which will help keep your temperature down.  It is common for your temperature to cycle up and down following surgery, especially at night when you are not up moving around and exerting yourself.  The breathing machine keeps your lungs expanded and your temperature down. °· Do not place pillow under knee, focus on keeping the knee straight while resting ° °DIET °You may resume your previous home diet once your are discharged from the hospital. ° °DRESSING / WOUND CARE / SHOWERING °You may shower 3 days after surgery, but keep the wounds dry during showering.  You may use an occlusive plastic wrap (Press'n Seal for example), NO SOAKING/SUBMERGING IN THE BATHTUB.  If the  bandage gets wet, change with a clean dry gauze.  If the incision gets wet, pat the wound dry with a clean towel. °You may start showering once you are discharged home but do not submerge the incision under water. Just pat the incision dry and apply a dry gauze dressing on daily. °Change the surgical dressing daily and reapply a dry dressing each time. ° °ACTIVITY °Walk with your walker as instructed. °Use walker as long as suggested by your caregivers. °Avoid periods of inactivity such as sitting longer than an hour when not asleep. This helps prevent blood clots.  °You may resume a sexual relationship in one month or when given the OK by your doctor.  °You may return to work once you are cleared by your doctor.  °Do not drive a car for 6 weeks or until released by you surgeon.  °Do not drive while taking narcotics. ° °WEIGHT BEARING °Weight bearing as tolerated with assist device (walker, cane, etc) as directed, use it as long as suggested by your surgeon or therapist, typically at least 4-6 weeks. ° °POSTOPERATIVE CONSTIPATION PROTOCOL °Constipation - defined medically as fewer than three stools per week and severe constipation as less than one stool per week. ° °One of the most common issues patients have following surgery is constipation.  Even if you have a regular bowel pattern at home, your normal regimen is likely to be disrupted due to multiple reasons following surgery.  Combination of anesthesia, postoperative narcotics, change in appetite and fluid intake all can affect your bowels.    In order to avoid complications following surgery, here are some recommendations in order to help you during your recovery period. ° °Colace (docusate) - Pick up an over-the-counter form of Colace or another stool softener and take twice a day as long as you are requiring postoperative pain medications.  Take with a full glass of water daily.  If you experience loose stools or diarrhea, hold the colace until you stool forms  back up.  If your symptoms do not get better within 1 week or if they get worse, check with your doctor. ° °Dulcolax (bisacodyl) - Pick up over-the-counter and take as directed by the product packaging as needed to assist with the movement of your bowels.  Take with a full glass of water.  Use this product as needed if not relieved by Colace only.  ° °MiraLax (polyethylene glycol) - Pick up over-the-counter to have on hand.  MiraLax is a solution that will increase the amount of water in your bowels to assist with bowel movements.  Take as directed and can mix with a glass of water, juice, soda, coffee, or tea.  Take if you go more than two days without a movement. °Do not use MiraLax more than once per day. Call your doctor if you are still constipated or irregular after using this medication for 7 days in a row. ° °If you continue to have problems with postoperative constipation, please contact the office for further assistance and recommendations.  If you experience "the worst abdominal pain ever" or develop nausea or vomiting, please contact the office immediatly for further recommendations for treatment. ° °ITCHING ° If you experience itching with your medications, try taking only a single pain pill, or even half a pain pill at a time.  You can also use Benadryl over the counter for itching or also to help with sleep.  ° °TED HOSE STOCKINGS °Wear the elastic stockings on both legs for three weeks following surgery during the day but you may remove then at night for sleeping. ° °MEDICATIONS °See your medication summary on the “After Visit Summary” that the nursing staff will review with you prior to discharge.  You may have some home medications which will be placed on hold until you complete the course of blood thinner medication.  It is important for you to complete the blood thinner medication as prescribed by your surgeon.  Continue your approved medications as instructed at time of  discharge. ° °PRECAUTIONS °If you experience chest pain or shortness of breath - call 911 immediately for transfer to the hospital emergency department.  °If you develop a fever greater that 101 F, purulent drainage from wound, increased redness or drainage from wound, foul odor from the wound/dressing, or calf pain - CONTACT YOUR SURGEON.   °                                                °FOLLOW-UP APPOINTMENTS °Make sure you keep all of your appointments after your operation with your surgeon and caregivers. You should call the office at the above phone number and make an appointment for approximately two weeks after the date of your surgery or on the date instructed by your surgeon outlined in the "After Visit Summary". ° ° °RANGE OF MOTION AND STRENGTHENING EXERCISES  °Rehabilitation of the knee is important following a knee injury or   an operation. After just a few days of immobilization, the muscles of the thigh which control the knee become weakened and shrink (atrophy). Knee exercises are designed to build up the tone and strength of the thigh muscles and to improve knee motion. Often times heat used for twenty to thirty minutes before working out will loosen up your tissues and help with improving the range of motion but do not use heat for the first two weeks following surgery. These exercises can be done on a training (exercise) mat, on the floor, on a table or on a bed. Use what ever works the best and is most comfortable for you Knee exercises include:  °Leg Lifts - While your knee is still immobilized in a splint or cast, you can do straight leg raises. Lift the leg to 60 degrees, hold for 3 sec, and slowly lower the leg. Repeat 10-20 times 2-3 times daily. Perform this exercise against resistance later as your knee gets better.  °Quad and Hamstring Sets - Tighten up the muscle on the front of the thigh (Quad) and hold for 5-10 sec. Repeat this 10-20 times hourly. Hamstring sets are done by pushing the  foot backward against an object and holding for 5-10 sec. Repeat as with quad sets.  °· Leg Slides: Lying on your back, slowly slide your foot toward your buttocks, bending your knee up off the floor (only go as far as is comfortable). Then slowly slide your foot back down until your leg is flat on the floor again. °· Angel Wings: Lying on your back spread your legs to the side as far apart as you can without causing discomfort.  °A rehabilitation program following serious knee injuries can speed recovery and prevent re-injury in the future due to weakened muscles. Contact your doctor or a physical therapist for more information on knee rehabilitation.  ° °IF YOU ARE TRANSFERRED TO A SKILLED REHAB FACILITY °If the patient is transferred to a skilled rehab facility following release from the hospital, a list of the current medications will be sent to the facility for the patient to continue.  When discharged from the skilled rehab facility, please have the facility set up the patient's Home Health Physical Therapy prior to being released. Also, the skilled facility will be responsible for providing the patient with their medications at time of release from the facility to include their pain medication, the muscle relaxants, and their blood thinner medication. If the patient is still at the rehab facility at time of the two week follow up appointment, the skilled rehab facility will also need to assist the patient in arranging follow up appointment in our office and any transportation needs. ° °MAKE SURE YOU:  °Understand these instructions.  °Get help right away if you are not doing well or get worse.  ° ° °Pick up stool softner and laxative for home use following surgery while on pain medications. °Do not submerge incision under water. °Please use good hand washing techniques while changing dressing each day. °May shower starting three days after surgery. °Please use a clean towel to pat the incision dry following  showers. °Continue to use ice for pain and swelling after surgery. °Do not use any lotions or creams on the incision until instructed by your surgeon. ° °Take Xarelto for two and a half more weeks, then discontinue Xarelto. °Once the patient has completed the Xarelto, they may resume the 81 mg Aspirin. ° ° °Information on my medicine - XARELTO® (Rivaroxaban) ° °  This medication education was reviewed with me or my healthcare representative as part of my discharge preparation.   ° °Why was Xarelto® prescribed for you? °Xarelto® was prescribed for you to reduce the risk of blood clots forming after orthopedic surgery. The medical term for these abnormal blood clots is venous thromboembolism (VTE). ° °What do you need to know about xarelto® ? °Take your Xarelto® ONCE DAILY at the same time every day. °You may take it either with or without food. ° °If you have difficulty swallowing the tablet whole, you may crush it and mix in applesauce just prior to taking your dose. ° °Take Xarelto® exactly as prescribed by your doctor and DO NOT stop taking Xarelto® without talking to the doctor who prescribed the medication.  Stopping without other VTE prevention medication to take the place of Xarelto® may increase your risk of developing a clot. ° °After discharge, you should have regular check-up appointments with your healthcare provider that is prescribing your Xarelto®.   ° °What do you do if you miss a dose? °If you miss a dose, take it as soon as you remember on the same day then continue your regularly scheduled once daily regimen the next day. Do not take two doses of Xarelto® on the same day.  ° °Important Safety Information °A possible side effect of Xarelto® is bleeding. You should call your healthcare provider right away if you experience any of the following: °? Bleeding from an injury or your nose that does not stop. °? Unusual colored urine (red or dark brown) or unusual colored stools (red or black). °? Unusual  bruising for unknown reasons. °? A serious fall or if you hit your head (even if there is no bleeding). ° °Some medicines may interact with Xarelto® and might increase your risk of bleeding while on Xarelto®. To help avoid this, consult your healthcare provider or pharmacist prior to using any new prescription or non-prescription medications, including herbals, vitamins, non-steroidal anti-inflammatory drugs (NSAIDs) and supplements. ° °This website has more information on Xarelto®: www.xarelto.com. ° ° °

## 2015-03-15 NOTE — Evaluation (Signed)
Occupational Therapy Evaluation Patient Details Name: TENNIA Scott MRN: [id number] DOB: [birth year] Today's Date: 03/15/2015    History of Present Illness 75 yo female s/p L TKA 03/14/15   Clinical Impression   Pt was admitted for the above surgery. Will follow in acute to further educate on safe bathroom transfers. Goals are for supervision to min guard level    Follow Up Recommendations  Supervision/Assistance - 24 hour    Equipment Recommendations  None recommended by OT    Recommendations for Other Services       Precautions / Restrictions Precautions Precautions: Knee;Fall Required Braces or Orthoses: Knee Immobilizer - Left Knee Immobilizer - Left: Discontinue once straight leg raise with < 10 degree lag Restrictions Weight Bearing Restrictions: No LLE Weight Bearing: Weight bearing as tolerated      Mobility Bed Mobility   Bed Mobility: Supine to Sit     Supine to sit: Min assist     General bed mobility comments: Asssit for L LE  Transfers   Equipment used: Rolling walker (2 wheeled) Transfers: Sit to/from Stand Sit to Stand: Min assist         General transfer comment: for safety; cues for UE/LE placement, min A for LLE when sitting    Balance                                            ADL Overall ADL's : Needs assistance/impaired     Grooming: Oral care;Set up;Sitting   Upper Body Bathing: Set up;Sitting   Lower Body Bathing: Minimal assistance;Sit to/from stand   Upper Body Dressing : Set up;Sitting   Lower Body Dressing: Moderate assistance;Sit to/from stand   Toilet Transfer: Stand-pivot;RW;Minimal assistance (chair)   Toileting- Water quality scientist and Hygiene: Min guard;Sit to/from stand         General ADL Comments: performed ADL from EOB and transferred to chair.  Educated on AE. Pt has access to reacher, and sister will assist as needed     Vision     Perception     Praxis      Pertinent  Vitals/Pain Pain Assessment: 0-10 Pain Score: 3  Pain Location: L knee Pain Descriptors / Indicators: Aching Pain Intervention(s): Limited activity within patient's tolerance;Monitored during session;Premedicated before session;Repositioned;Ice applied     Hand Dominance     Extremity/Trunk Assessment Upper Extremity Assessment Upper Extremity Assessment: Overall WFL for tasks assessed           Communication Communication Communication: No difficulties   Cognition Arousal/Alertness: Awake/alert Behavior During Therapy: WFL for tasks assessed/performed Overall Cognitive Status: Within Functional Limits for tasks assessed                     General Comments       Exercises       Shoulder Instructions      Home Living Family/patient expects to be discharged to:: Private residence Living Arrangements: Alone Available Help at Discharge: Family Type of Home: House             Bathroom Shower/Tub: Walk-in shower   Bathroom Toilet: Handicapped height     Home Equipment: Environmental consultant - 2 wheels;Shower seat;Bedside commode   Additional Comments: will stay with sister, who is an Therapist, sports      Prior Functioning/Environment Level of Independence: Independent  OT Diagnosis: Acute pain   OT Problem List: Decreased activity tolerance;Decreased knowledge of use of DME or AE;Pain   OT Treatment/Interventions: Self-care/ADL training;DME and/or AE instruction;Patient/family education    OT Goals(Current goals can be found in the care plan section) Acute Rehab OT Goals Patient Stated Goal: to sister's house then back home OT Goal Formulation: With patient Time For Goal Achievement: 03/22/15 Potential to Achieve Goals: Good ADL Goals Pt Will Transfer to Toilet: with supervision;ambulating;bedside commode Pt Will Perform Tub/Shower Transfer: Shower transfer;with min guard assist;3 in 1;ambulating  OT Frequency: Min 2X/week   Barriers to D/C:             Co-evaluation              End of Session CPM Left Knee CPM Left Knee: Off  Activity Tolerance: Patient tolerated treatment well Patient left: in chair;with call bell/phone within reach;with chair alarm set   Time: [id number] OT Time Calculation (min): 28 min Charges:  OT General Charges $OT Visit: 1 Procedure OT Evaluation $OT Eval Low Complexity: 1 Procedure OT Treatments $Self Care/Home Management : 8-22 mins G-Codes:    [name] 2015-04-01, 8:42 AM Lesle Chris, OTR/L [phone] 01-Apr-2015

## 2015-03-15 NOTE — Discharge Summary (Signed)
Physician Discharge Summary   Patient ID: [name] MRN: [id number] DOB/AGE: 09/17/1940 75 y.o.  Admit date: 03/14/2015 Discharge date: 03/16/2015  Primary Diagnosis:  Osteoarthritis Left knee(s) Admission Diagnoses:  Past Medical History  Diagnosis Date  . CHB (complete heart block) ([place]) 2007  . Arthritis   . Postmenopausal   . Essential hypertension, benign   . Cardiac pacemaker in situ   . Atrioventricular block, complete ([place])   . Hyperlipidemia   . Thyroid disease     hypothyroid  . History of skin cancer   . Frequency of urination    Discharge Diagnoses:   Principal Problem:   OA (osteoarthritis) of knee  Estimated body mass index is 31.38 kg/(m^2) as calculated from the following:   Height as of this encounter: 5' 3.5" (1.613 m).   Weight as of this encounter: 81.647 kg (180 lb).  Procedure:  Procedure(s) (LRB): TOTAL KNEE ARTHROPLASTY (Left)   Consults: None  HPI: [name] is a 75 y.o. year old female with end stage OA of her left knee with progressively worsening pain and dysfunction. She has constant pain, with activity and at rest and significant functional deficits with difficulties even with ADLs. She has had extensive non-op management including analgesics, injections of cortisone and viscosupplements, and home exercise program, but remains in significant pain with significant dysfunction. Radiographs show bone on bone arthritis medial and patellofemoral. She presents now for left Total Knee Arthroplasty.   Laboratory Data: Admission on 03/14/2015, Discharged on 03/16/2015  Component Date Value Ref Range Status  . WBC 03/15/2015 12.1* 4.0 - 10.5 K/uL Final  . RBC 03/15/2015 3.76* 3.87 - 5.11 MIL/uL Final  . Hemoglobin 03/15/2015 11.3* 12.0 - 15.0 g/dL Final  . HCT 03/15/2015 34.6* 36.0 - 46.0 % Final  . MCV 03/15/2015 92.0  78.0 - 100.0 fL Final  . MCH 03/15/2015 30.1  26.0 - 34.0 pg Final  . MCHC 03/15/2015 32.7  30.0 - 36.0 g/dL Final   . RDW 03/15/2015 13.4  11.5 - 15.5 % Final  . Platelets 03/15/2015 193  150 - 400 K/uL Final  . Sodium 03/15/2015 140  135 - 145 mmol/L Final  . Potassium 03/15/2015 4.0  3.5 - 5.1 mmol/L Final  . Chloride 03/15/2015 107  101 - 111 mmol/L Final  . CO2 03/15/2015 25  22 - 32 mmol/L Final  . Glucose, Bld 03/15/2015 151* 65 - 99 mg/dL Final  . BUN 03/15/2015 23* 6 - 20 mg/dL Final  . Creatinine, Ser 03/15/2015 0.98  0.44 - 1.00 mg/dL Final  . Calcium 03/15/2015 9.0  8.9 - 10.3 mg/dL Final  . GFR calc non Af Amer 03/15/2015 55* >60 mL/min Final  . GFR calc Af Amer 03/15/2015 >60  >60 mL/min Final   Comment: (NOTE) The eGFR has been calculated using the CKD EPI equation. This calculation has not been validated in all clinical situations. eGFR's persistently <60 mL/min signify possible Chronic Kidney Disease.   . Anion gap 03/15/2015 8  5 - 15 Final  . WBC 03/16/2015 12.0* 4.0 - 10.5 K/uL Final  . RBC 03/16/2015 3.57* 3.87 - 5.11 MIL/uL Final  . Hemoglobin 03/16/2015 10.5* 12.0 - 15.0 g/dL Final  . HCT 03/16/2015 32.2* 36.0 - 46.0 % Final  . MCV 03/16/2015 90.2  78.0 - 100.0 fL Final  . MCH 03/16/2015 29.4  26.0 - 34.0 pg Final  . MCHC 03/16/2015 32.6  30.0 - 36.0 g/dL Final  . RDW 03/16/2015 13.4  11.5 -  15.5 % Final  . Platelets 03/16/2015 178  150 - 400 K/uL Final  . Sodium 03/16/2015 134* 135 - 145 mmol/L Final  . Potassium 03/16/2015 3.6  3.5 - 5.1 mmol/L Final  . Chloride 03/16/2015 101  101 - 111 mmol/L Final  . CO2 03/16/2015 26  22 - 32 mmol/L Final  . Glucose, Bld 03/16/2015 128* 65 - 99 mg/dL Final  . BUN 03/16/2015 25* 6 - 20 mg/dL Final  . Creatinine, Ser 03/16/2015 0.88  0.44 - 1.00 mg/dL Final  . Calcium 03/16/2015 8.5* 8.9 - 10.3 mg/dL Final  . GFR calc non Af Amer 03/16/2015 >60  >60 mL/min Final  . GFR calc Af Amer 03/16/2015 >60  >60 mL/min Final   Comment: (NOTE) The eGFR has been calculated using the CKD EPI equation. This calculation has not been validated in  all clinical situations. eGFR's persistently <60 mL/min signify possible Chronic Kidney Disease.   Georgiann Hahn gap 03/16/2015 7  5 - 15 Final  Hospital Outpatient Visit on 03/07/2015  Component Date Value Ref Range Status  . aPTT 03/07/2015 27  24 - 37 seconds Final  . WBC 03/07/2015 7.3  4.0 - 10.5 K/uL Final  . RBC 03/07/2015 4.83  3.87 - 5.11 MIL/uL Final  . Hemoglobin 03/07/2015 14.4  12.0 - 15.0 g/dL Final  . HCT 03/07/2015 45.3  36.0 - 46.0 % Final  . MCV 03/07/2015 93.8  78.0 - 100.0 fL Final  . MCH 03/07/2015 29.8  26.0 - 34.0 pg Final  . MCHC 03/07/2015 31.8  30.0 - 36.0 g/dL Final  . RDW 03/07/2015 13.8  11.5 - 15.5 % Final  . Platelets 03/07/2015 250  150 - 400 K/uL Final  . Sodium 03/07/2015 143  135 - 145 mmol/L Final  . Potassium 03/07/2015 5.8* 3.5 - 5.1 mmol/L Final  . Chloride 03/07/2015 106  101 - 111 mmol/L Final  . CO2 03/07/2015 27  22 - 32 mmol/L Final  . Glucose, Bld 03/07/2015 98  65 - 99 mg/dL Final  . BUN 03/07/2015 22* 6 - 20 mg/dL Final  . Creatinine, Ser 03/07/2015 1.04* 0.44 - 1.00 mg/dL Final  . Calcium 03/07/2015 10.1  8.9 - 10.3 mg/dL Final  . Total Protein 03/07/2015 8.1  6.5 - 8.1 g/dL Final  . Albumin 03/07/2015 4.8  3.5 - 5.0 g/dL Final  . AST 03/07/2015 28  15 - 41 U/L Final  . ALT 03/07/2015 20  14 - 54 U/L Final  . Alkaline Phosphatase 03/07/2015 64  38 - 126 U/L Final  . Total Bilirubin 03/07/2015 0.7  0.3 - 1.2 mg/dL Final  . GFR calc non Af Amer 03/07/2015 52* >60 mL/min Final  . GFR calc Af Amer 03/07/2015 60* >60 mL/min Final   Comment: (NOTE) The eGFR has been calculated using the CKD EPI equation. This calculation has not been validated in all clinical situations. eGFR's persistently <60 mL/min signify possible Chronic Kidney Disease.   . Anion gap 03/07/2015 10  5 - 15 Final  . Prothrombin Time 03/07/2015 14.0  11.6 - 15.2 seconds Final  . INR 03/07/2015 1.11  0.00 - 1.49 Final  . ABO/RH(D) 03/07/2015 O POS   Final  . Antibody  Screen 03/07/2015 NEG   Final  . Sample Expiration 03/07/2015 03/17/2015   Final  . Extend sample reason 03/07/2015 NO TRANSFUSIONS OR PREGNANCY IN THE PAST 3 MONTHS   Final  . Color, Urine 03/07/2015 YELLOW  YELLOW Final  . APPearance 03/07/2015 CLEAR  CLEAR Final  . Specific Gravity, Urine 03/07/2015 1.006  1.005 - 1.030 Final  . pH 03/07/2015 7.0  5.0 - 8.0 Final  . Glucose, UA 03/07/2015 NEGATIVE  NEGATIVE mg/dL Final  . Hgb urine dipstick 03/07/2015 NEGATIVE  NEGATIVE Final  . Bilirubin Urine 03/07/2015 NEGATIVE  NEGATIVE Final  . Ketones, ur 03/07/2015 NEGATIVE  NEGATIVE mg/dL Final  . Protein, ur 03/07/2015 NEGATIVE  NEGATIVE mg/dL Final  . Nitrite 03/07/2015 NEGATIVE  NEGATIVE Final  . Leukocytes, UA 03/07/2015 TRACE* NEGATIVE Final  . Squamous Epithelial / LPF 03/07/2015 6-30* NONE SEEN Final  . WBC, UA 03/07/2015 0-5  0 - 5 WBC/hpf Final  . RBC / HPF 03/07/2015 NONE SEEN  0 - 5 RBC/hpf Final  . Bacteria, UA 03/07/2015 NONE SEEN  NONE SEEN Final  . MRSA, PCR 03/07/2015 NEGATIVE  NEGATIVE Final  . Staphylococcus aureus 03/07/2015 NEGATIVE  NEGATIVE Final   Comment:        The Xpert SA Assay (FDA approved for NASAL specimens in patients over 38 years of age), is one component of a comprehensive surveillance program.  Test performance has been validated by [facility] for patients greater than or equal to 25 year old. It is not intended to diagnose infection nor to guide or monitor treatment.      X-Rays:No results found.  EKG: Orders placed or performed during the hospital encounter of 03/07/15  . EKG 12 lead  . EKG 12 lead     Hospital Course: [name] is a 75 y.o. who was admitted to [facility]. They were brought to the operating room on 03/14/2015 and underwent Procedure(s): TOTAL KNEE ARTHROPLASTY.  Patient tolerated the procedure well and was later transferred to the recovery room and then to the orthopaedic floor for postoperative care.  They  were given PO and IV analgesics for pain control following their surgery.  They were given 24 hours of postoperative antibiotics of  Anti-infectives    Start     Dose/Rate Route Frequency Ordered Stop   03/14/15 1500  ceFAZolin (ANCEF) IVPB 2 g/50 mL premix     2 g 100 mL/hr over 30 Minutes Intravenous Every 6 hours 03/14/15 1137 03/14/15 2232   03/14/15 0641  ceFAZolin (ANCEF) IVPB 2 g/50 mL premix     2 g 100 mL/hr over 30 Minutes Intravenous On call to O.R. 03/14/15 3329 03/14/15 5188     and started on DVT prophylaxis in the form of Xarelto.   PT and OT were ordered for total joint protocol.  Discharge planning consulted to help with postop disposition and equipment needs.  Patient had a good night on the evening of surgery and walked 40 feet that same day..  They started to get up OOB with therapy on day one. Hemovac drain was pulled without difficulty.  Continued to work with therapy into day two.  Dressing was changed on day two and the incision was healing well.  Patient was seen in rounds on POD 2 and was ready to go home.  Discharge home with home health Diet - Cardiac diet Follow up - in 2 weeks Activity - WBAT Disposition - Home Condition Upon Discharge - Good D/C Meds - See DC Summary DVT Prophylaxis - Xarelto      Discharge Instructions    Call MD / Call 911    Complete by:  As directed   If you experience chest pain or shortness of breath, CALL 911 and be transported to the  hospital emergency room.  If you develope a fever above 101 F, pus (white drainage) or increased drainage or redness at the wound, or calf pain, call your surgeon's office.     Change dressing    Complete by:  As directed   Change dressing daily with sterile 4 x 4 inch gauze dressing and apply TED hose. Do not submerge the incision under water.     Constipation Prevention    Complete by:  As directed   Drink plenty of fluids.  Prune juice may be helpful.  You may use a stool softener, such as Colace  (over the counter) 100 mg twice a day.  Use MiraLax (over the counter) for constipation as needed.     Diet - low sodium heart healthy    Complete by:  As directed      Discharge instructions    Complete by:  As directed   Pick up stool softner and laxative for home use following surgery while on pain medications. Do not submerge incision under water. Please use good hand washing techniques while changing dressing each day. May shower starting three days after surgery. Please use a clean towel to pat the incision dry following showers. Continue to use ice for pain and swelling after surgery. Do not use any lotions or creams on the incision until instructed by your surgeon.  Take Xarelto for two and a half more weeks, then discontinue Xarelto. Once the patient has completed the Xarelto, they may resume the 81 mg Aspirin.  Postoperative Constipation Protocol  Constipation - defined medically as fewer than three stools per week and severe constipation as less than one stool per week.  One of the most common issues patients have following surgery is constipation.  Even if you have a regular bowel pattern at home, your normal regimen is likely to be disrupted due to multiple reasons following surgery.  Combination of anesthesia, postoperative narcotics, change in appetite and fluid intake all can affect your bowels.  In order to avoid complications following surgery, here are some recommendations in order to help you during your recovery period.  Colace (docusate) - Pick up an over-the-counter form of Colace or another stool softener and take twice a day as long as you are requiring postoperative pain medications.  Take with a full glass of water daily.  If you experience loose stools or diarrhea, hold the colace until you stool forms back up.  If your symptoms do not get better within 1 week or if they get worse, check with your doctor.  Dulcolax (bisacodyl) - Pick up over-the-counter and take as  directed by the product packaging as needed to assist with the movement of your bowels.  Take with a full glass of water.  Use this product as needed if not relieved by Colace only.   MiraLax (polyethylene glycol) - Pick up over-the-counter to have on hand.  MiraLax is a solution that will increase the amount of water in your bowels to assist with bowel movements.  Take as directed and can mix with a glass of water, juice, soda, coffee, or tea.  Take if you go more than two days without a movement. Do not use MiraLax more than once per day. Call your doctor if you are still constipated or irregular after using this medication for 7 days in a row.  If you continue to have problems with postoperative constipation, please contact the office for further assistance and recommendations.  If you experience "the worst  abdominal pain ever" or develop nausea or vomiting, please contact the office immediatly for further recommendations for treatment.     Do not put a pillow under the knee. Place it under the heel.    Complete by:  As directed      Do not sit on low chairs, stoools or toilet seats, as it may be difficult to get up from low surfaces    Complete by:  As directed      Driving restrictions    Complete by:  As directed   No driving until released by the physician.     Increase activity slowly as tolerated    Complete by:  As directed      Lifting restrictions    Complete by:  As directed   No lifting until released by the physician.     Patient may shower    Complete by:  As directed   You may shower without a dressing once there is no drainage.  Do not wash over the wound.  If drainage remains, do not shower until drainage stops.     TED hose    Complete by:  As directed   Use stockings (TED hose) for 3 weeks on both leg(s).  You may remove them at night for sleeping.     Weight bearing as tolerated    Complete by:  As directed   Laterality:  left  Extremity:  Lower              Medication List    STOP taking these medications        aspirin 81 MG tablet     naproxen sodium 220 MG tablet  Commonly known as:  ANAPROX      TAKE these medications        amLODipine 10 MG tablet  Commonly known as:  NORVASC  Take 10 mg by mouth Daily.     lisinopril-hydrochlorothiazide 20-25 MG tablet  Commonly known as:  PRINZIDE,ZESTORETIC  Take 1 tablet by mouth Daily.     methocarbamol 500 MG tablet  Commonly known as:  ROBAXIN  Take 1 tablet (500 mg total) by mouth every 6 (six) hours as needed for muscle spasms.     oxyCODONE 5 MG immediate release tablet  Commonly known as:  Oxy IR/ROXICODONE  Take 1-2 tablets (5-10 mg total) by mouth every 3 (three) hours as needed for moderate pain or severe pain.     pravastatin 40 MG tablet  Commonly known as:  PRAVACHOL  Take 40 mg by mouth Daily.     rivaroxaban 10 MG Tabs tablet  Commonly known as:  XARELTO  Take 1 tablet (10 mg total) by mouth daily with breakfast. Take Xarelto for two and a half more weeks, then discontinue Xarelto. Once the patient has completed the Xarelto, they may resume the 81 mg Aspirin.     SYNTHROID 112 MCG tablet  Generic drug:  levothyroxine  Take 112 mcg by mouth Daily.     traMADol 50 MG tablet  Commonly known as:  ULTRAM  Take 1-2 tablets (50-100 mg total) by mouth every 6 (six) hours as needed (mild pain).       Follow-up Information    Follow up with Gearlean Alf, MD On 03/29/2015.   Specialty:  Orthopedic Surgery   Why:  Call office at [phone] to setup appointment on Tuesday 3/21 with the PA, Dian Situ.   Contact information:   [address] [place] [place] [address]  [phone]       Follow up with [facility].   Contact information:   [address] [place] [address] [phone]       Signed: Arlee Muslim, PA-C Orthopaedic Surgery 03/24/2015, 8:31 AM

## 2015-03-15 NOTE — Progress Notes (Signed)
Physical Therapy Treatment Patient Details Name: BETSELOT COWPER MRN: [id number] DOB: Jun 13, 1940 Today's Date: 03/15/2015    History of Present Illness 75 yo female s/p L TKA 03/14/15    PT Comments    Progressing with mobility.   Follow Up Recommendations  Outpatient PT     Equipment Recommendations  None recommended by PT    Recommendations for Other Services       Precautions / Restrictions Precautions Precautions: Fall;Knee Required Braces or Orthoses: Knee Immobilizer - Left Knee Immobilizer - Left: Discontinue once straight leg raise with < 10 degree lag Restrictions Weight Bearing Restrictions: No LLE Weight Bearing: Weight bearing as tolerated    Mobility  Bed Mobility   Bed Mobility: Sit to Supine       Sit to supine: Min assist   General bed mobility comments: assist for LE   Transfers Overall transfer level: Needs assistance Equipment used: Rolling walker (2 wheeled) Transfers: Sit to/from Stand Sit to Stand: Min guard         General transfer comment: close guard for safety. VCs safety, hand/LE placement.   Ambulation/Gait Ambulation/Gait assistance: Min guard Ambulation Distance (Feet): 80 Feet Assistive device: Rolling walker (2 wheeled) Gait Pattern/deviations: Step-to pattern;Step-through pattern     General Gait Details: VCs safety, technique, sequence. close guard for safety.    Stairs            Wheelchair Mobility    Modified Rankin (Stroke Patients Only)       Balance                                    Cognition Arousal/Alertness: Awake/alert Behavior During Therapy: WFL for tasks assessed/performed Overall Cognitive Status: Within Functional Limits for tasks assessed                      Exercises     General Comments        Pertinent Vitals/Pain Pain Assessment: 0-10 Pain Score: 3  Pain Location: L knee Pain Descriptors / Indicators: Aching;Sore Pain Intervention(s):  Monitored during session;Ice applied;Repositioned    Home Living                      Prior Function            PT Goals (current goals can now be found in the care plan section) Progress towards PT goals: Progressing toward goals    Frequency  7X/week    PT Plan Current plan remains appropriate    Co-evaluation             End of Session Equipment Utilized During Treatment: Left knee immobilizer Activity Tolerance: Patient tolerated treatment well Patient left: in bed;with call bell/phone within reach;with bed alarm set;with family/visitor present     Time: [id number] PT Time Calculation (min) (ACUTE ONLY): 13 min  Charges:  $Gait Training: 8-22 mins                    G Codes:      Weston Anna, MPT Pager: [phone]

## 2015-03-15 NOTE — Progress Notes (Addendum)
Physical Therapy Treatment Patient Details Name: [name] MRN: [id number] DOB: Sep 04, 1940 Today's Date: 03/15/2015    History of Present Illness 75 yo female s/p L TKA 03/14/15    PT Comments    Progressing with mobility.   Follow Up Recommendations  Outpatient PT (pt states she has already arranged for OP PT after discharge)     Equipment Recommendations  None recommended by PT    Recommendations for Other Services       Precautions / Restrictions Precautions Precautions: Fall;Knee Required Braces or Orthoses: Knee Immobilizer - Left Knee Immobilizer - Left: Discontinue once straight leg raise with < 10 degree lag Restrictions Weight Bearing Restrictions: No LLE Weight Bearing: Weight bearing as tolerated    Mobility  Bed Mobility   Bed Mobility: Supine to Sit     Supine to sit: Min assist     General bed mobility comments: oob in recliner  Transfers Overall transfer level: Needs assistance Equipment used: Rolling walker (2 wheeled) Transfers: Sit to/from Stand Sit to Stand: Min guard         General transfer comment: close guard for safety. VCs safety, hand/LE placement.   Ambulation/Gait Ambulation/Gait assistance: Min guard Ambulation Distance (Feet): 80 Feet Assistive device: Rolling walker (2 wheeled) Gait Pattern/deviations: Step-to pattern;Step-through pattern     General Gait Details: VCs safety, technique, sequence. close guard for safety.    Stairs            Wheelchair Mobility    Modified Rankin (Stroke Patients Only)       Balance                                    Cognition Arousal/Alertness: Awake/alert Behavior During Therapy: WFL for tasks assessed/performed Overall Cognitive Status: Within Functional Limits for tasks assessed                      Exercises Total Joint Exercises Ankle Circles/Pumps: AROM;Both;10 reps;Supine Quad Sets: AROM;Both;10 reps;Supine Heel Slides:  AAROM;Left;10 reps;Supine Hip ABduction/ADduction: AAROM;Left;10 reps;Supine Straight Leg Raises: AAROM;Left;10 reps;Supine Goniometric ROM: ~10-50 degrees    General Comments        Pertinent Vitals/Pain Pain Assessment: 0-10 Pain Score: 3  Pain Location: L knee Pain Descriptors / Indicators: Aching;Sore Pain Intervention(s): Monitored during session;Ice applied;Repositioned    Home Living Family/patient expects to be discharged to:: Private residence Living Arrangements: Alone Available Help at Discharge: Family Type of Home: House       Home Equipment: Gilford Rile - 2 wheels;Shower seat;Bedside commode Additional Comments: will stay with sister, who is an Therapist, sports    Prior Function Level of Independence: Independent          PT Goals (current goals can now be found in the care plan section) Acute Rehab PT Goals Patient Stated Goal: to sister's house then back home Progress towards PT goals: Progressing toward goals    Frequency  7X/week    PT Plan Current plan remains appropriate    Co-evaluation             End of Session Equipment Utilized During Treatment: Left knee immobilizer Activity Tolerance: Patient tolerated treatment well Patient left: in chair;with call bell/phone within reach;with chair alarm set     Time: [id number] PT Time Calculation (min) (ACUTE ONLY): 21 min  Charges:  $Gait Training: 8-22 mins  G Codes:      Weston Anna, MPT Pager: [phone]

## 2015-03-15 NOTE — Care Management Note (Signed)
Case Management Note  Patient Details  Name: [name] MRN: [id number] Date of Birth: 12-21-1940  Subjective/Objective:                  TOTAL KNEE ARTHROPLASTY (Left) Action/Plan: Discharge planning Expected Discharge Date:  03/16/15               Expected Discharge Plan:  Home/Self Care  In-House Referral:     Discharge planning Services  CM Consult  Post Acute Care Choice:    Choice offered to:  Patient  DME Arranged:  N/A DME Agency:  NA  HH Arranged:  NA HH Agency:  NA  Status of Service:  Completed, signed off  Medicare Important Message Given:    Date Medicare IM Given:    Medicare IM give by:    Date Additional Medicare IM Given:    Additional Medicare Important Message give by:     If discussed at [place] of Stay Meetings, dates discussed:    Additional Comments: CM met with pt in room to offer choice of home health agency.  Pt states she is NOT having [place] services as she is going to outpt therapy.  Pt states she has both rolling walker and elevated commode at her sister's where she will be recuperating.  No other CM needs were communicated. Dellie Catholic, RN 03/15/2015, 10:21 AM

## 2015-03-16 LAB — CBC
HCT: 32.2 % — ABNORMAL LOW (ref 36.0–46.0)
Hemoglobin: 10.5 g/dL — ABNORMAL LOW (ref 12.0–15.0)
MCH: 29.4 pg (ref 26.0–34.0)
MCHC: 32.6 g/dL (ref 30.0–36.0)
MCV: 90.2 fL (ref 78.0–100.0)
PLATELETS: 178 10*3/uL (ref 150–400)
RBC: 3.57 MIL/uL — AB (ref 3.87–5.11)
RDW: 13.4 % (ref 11.5–15.5)
WBC: 12 10*3/uL — AB (ref 4.0–10.5)

## 2015-03-16 LAB — BASIC METABOLIC PANEL
ANION GAP: 7 (ref 5–15)
BUN: 25 mg/dL — ABNORMAL HIGH (ref 6–20)
CO2: 26 mmol/L (ref 22–32)
Calcium: 8.5 mg/dL — ABNORMAL LOW (ref 8.9–10.3)
Chloride: 101 mmol/L (ref 101–111)
Creatinine, Ser: 0.88 mg/dL (ref 0.44–1.00)
Glucose, Bld: 128 mg/dL — ABNORMAL HIGH (ref 65–99)
POTASSIUM: 3.6 mmol/L (ref 3.5–5.1)
SODIUM: 134 mmol/L — AB (ref 135–145)

## 2015-03-16 NOTE — Progress Notes (Signed)
Occupational Therapy Treatment Patient Details Name: LAVEEDA OGAS MRN: [id number] DOB: 12-17-1940 Today's Date: 03/16/2015    History of present illness 75 yo female s/p L TKA 03/14/15   OT comments  Pt plans d/c today. Reviewed all education, but pt did not practice shower transfer.  Min A needed to get up from comfort height commode--she was on this when OT arrived; pt will have 3:1 to use at home  Follow Up Recommendations  Supervision/Assistance - 24 hour    Equipment Recommendations  None recommended by OT    Recommendations for Other Services      Precautions / Restrictions Precautions Precautions: Fall;Knee Required Braces or Orthoses: Knee Immobilizer - Left (pt reports that KI was discontinued today and was in the bathroom without it) Knee Immobilizer - Left: Discontinue once straight leg raise with < 10 degree lag Restrictions Weight Bearing Restrictions: No LLE Weight Bearing: Weight bearing as tolerated       Mobility Bed Mobility Overal bed mobility: Needs Assistance           General bed mobility comments: oob  Transfers Overall transfer level: Needs assistance Equipment used: Rolling walker (2 wheeled) Transfers: Sit to/from Stand Sit to Stand: Min assist         General transfer comment: from comfort height commode with single grab bar    Balance                                   ADL                           Toilet Transfer: Minimal assistance;Ambulation;Comfort height toilet;BSC;Grab bars   Toileting- Clothing Manipulation and Hygiene: Minimal assistance;Sit to/from stand (for clothing)         General ADL Comments: pt sat on comfort height commode with grab bar; she did not want to practice shower transfer prior to d/c.  Demonstrated technique and gave her a handout      Vision                     Perception     Praxis      Cognition   Behavior During Therapy: WFL for tasks  assessed/performed Overall Cognitive Status: Within Functional Limits for tasks assessed                       Extremity/Trunk Assessment               Exercises    Shoulder Instructions       General Comments      Pertinent Vitals/ Pain       Pain Assessment: 0-10 Pain Score: 5  Pain Location: L knee Pain Descriptors / Indicators: Aching Pain Intervention(s): Monitored during session;Repositioned;Limited activity within patient's tolerance;Premedicated before session  Home Living                                          Prior Functioning/Environment              Frequency       Progress Toward Goals  OT Goals(current goals can now be found in the care plan section)  Progress towards OT goals: Progressing toward goals     Plan  Co-evaluation                 End of Session     Activity Tolerance Patient tolerated treatment well   Patient Left in chair;with call bell/phone within reach;with chair alarm set;with family/visitor present   Nurse Communication          Time: [id number] OT Time Calculation (min): 8 min  Charges: OT General Charges $OT Visit: 1 Procedure OT Treatments $Self Care/Home Management : 8-22 mins  Jahni Paul 03/16/2015, 12:54 PM  Lesle Chris, OTR/L [phone] 03/16/2015

## 2015-03-16 NOTE — Progress Notes (Signed)
   Subjective: 2 Days Post-Op Procedure(s) (LRB): TOTAL KNEE ARTHROPLASTY (Left) Patient reports pain as mild.   Patient seen in rounds with Dr. Wynelle Link. Patient is well, and has had no acute complaints or problems Patient is ready to go home  Objective: Vital signs in last 24 hours: Temp:  [98.6 F (37 C)-99.2 F (37.3 C)] 98.6 F (37 C) (03/08 0547) Pulse Rate:  [78-90] 90 (03/08 0547) Resp:  [16] 16 (03/08 0547) BP: (132-149)/(52-58) 148/58 mmHg (03/08 0547) SpO2:  [95 %-96 %] 96 % (03/08 0547)  Intake/Output from previous day:  Intake/Output Summary (Last 24 hours) at 03/16/15 0725 Last data filed at 03/16/15 [id number]  Gross per 24 hour  Intake    480 ml  Output    775 ml  Net   -295 ml     Labs:  Recent Labs  03/15/15 0351 03/16/15 0409  HGB 11.3* 10.5*    Recent Labs  03/15/15 0351 03/16/15 0409  WBC 12.1* 12.0*  RBC 3.76* 3.57*  HCT 34.6* 32.2*  PLT 193 178    Recent Labs  03/15/15 0351 03/16/15 0409  NA 140 134*  K 4.0 3.6  CL 107 101  CO2 25 26  BUN 23* 25*  CREATININE 0.98 0.88  GLUCOSE 151* 128*  CALCIUM 9.0 8.5*   No results for input(s): LABPT, INR in the last 72 hours.  EXAM: General - Patient is Alert and Appropriate Extremity - Neurovascular intact Sensation intact distally Incision - clean, dry, no drainage Motor Function - intact, moving foot and toes well on exam.   Assessment/Plan: 2 Days Post-Op Procedure(s) (LRB): TOTAL KNEE ARTHROPLASTY (Left) Procedure(s) (LRB): TOTAL KNEE ARTHROPLASTY (Left) Past Medical History  Diagnosis Date  . CHB (complete heart block) ([place]) 2007  . Arthritis   . Postmenopausal   . Essential hypertension, benign   . Cardiac pacemaker in situ   . Atrioventricular block, complete ([place])   . Hyperlipidemia   . Thyroid disease     hypothyroid  . History of skin cancer   . Frequency of urination    Principal Problem:   OA (osteoarthritis) of knee  Estimated body mass index is 31.38  kg/(m^2) as calculated from the following:   Height as of this encounter: 5' 3.5" (1.613 m).   Weight as of this encounter: 81.647 kg (180 lb). Up with therapy Discharge home with home health Diet - Cardiac diet Follow up - in 2 weeks Activity - WBAT Disposition - Home Condition Upon Discharge - Good D/C Meds - See DC Summary DVT Prophylaxis - Xarelto  Arlee Muslim, PA-C Orthopaedic Surgery 03/16/2015, 7:25 AM

## 2015-03-16 NOTE — Progress Notes (Signed)
RN reports pt has changed her mind and needs a 3n1; CM called [facility] DME rep, Lecretia to please deliver the 3n1 so pt can go home.  No other CM needs were communicated.

## 2015-03-16 NOTE — Progress Notes (Signed)
[facility]  Delivered commode to hospital room   Linward Headland 03/16/2015, 12:38 PM

## 2015-03-16 NOTE — Progress Notes (Signed)
Physical Therapy Treatment Patient Details Name: [name] MRN: [id number] DOB: [birth year] Today's Date: 03/16/2015    History of Present Illness 75 yo female s/p L TKA 03/14/15    PT Comments    Progressing with mobility. Practiced/reviewed exercises, gait training, stair training. All education completed.   Follow Up Recommendations  Outpatient PT     Equipment Recommendations  None recommended by PT    Recommendations for Other Services       Precautions / Restrictions Precautions Precautions: Fall;Knee Required Braces or Orthoses: Knee Immobilizer - Left Knee Immobilizer - Left: Discontinue once straight leg raise with < 10 degree lag Restrictions Weight Bearing Restrictions: No LLE Weight Bearing: Weight bearing as tolerated    Mobility  Bed Mobility Overal bed mobility: Needs Assistance Bed Mobility: Supine to Sit     Supine to sit: Min assist     General bed mobility comments: assist for LE   Transfers Overall transfer level: Needs assistance Equipment used: Rolling walker (2 wheeled) Transfers: Sit to/from Stand Sit to Stand: Min guard         General transfer comment: close guard for safety. VCs safety, hand/LE placement.   Ambulation/Gait Ambulation/Gait assistance: Min guard Ambulation Distance (Feet): 70 Feet Assistive device: Rolling walker (2 wheeled) Gait Pattern/deviations: Step-to pattern     General Gait Details: VCs safety, technique, sequence. close guard for safety.    Stairs Stairs: Yes Stairs assistance: Min assist Stair Management: Step to pattern;Backwards;With walker Number of Stairs: 2 General stair comments: VCs safety, technique, sequence. Practiced x1. Pt declined to practice a 2nd time.   Wheelchair Mobility    Modified Rankin (Stroke Patients Only)       Balance                                    Cognition Arousal/Alertness: Awake/alert Behavior During Therapy: WFL for tasks  assessed/performed Overall Cognitive Status: Within Functional Limits for tasks assessed                      Exercises Total Joint Exercises Ankle Circles/Pumps: AROM;Both;10 reps;Supine Quad Sets: AROM;Both;10 reps;Supine Heel Slides: AAROM;Left;10 reps;Supine Hip ABduction/ADduction: AAROM;Left;10 reps;Supine Straight Leg Raises: AAROM;Left;10 reps;Supine Goniometric ROM: ~10-55 degrees    General Comments        Pertinent Vitals/Pain Pain Assessment: 0-10 Pain Score: 5  Pain Location: L knee Pain Descriptors / Indicators: Aching;Sore Pain Intervention(s): Monitored during session;Ice applied;Repositioned    Home Living                      Prior Function            PT Goals (current goals can now be found in the care plan section) Progress towards PT goals: Progressing toward goals    Frequency  7X/week    PT Plan Current plan remains appropriate    Co-evaluation             End of Session Equipment Utilized During Treatment: Left knee immobilizer Activity Tolerance: Patient tolerated treatment well Patient left: in chair;with call bell/phone within reach;with chair alarm set;with family/visitor present     Time: [id number] PT Time Calculation (min) (ACUTE ONLY): 27 min  Charges:  $Gait Training: 8-22 mins $Therapeutic Exercise: 8-22 mins                    G Codes:  Weston Anna, MPT Pager: [phone]

## 2015-04-18 ENCOUNTER — Encounter: Payer: Self-pay | Admitting: Internal Medicine

## 2015-04-18 ENCOUNTER — Ambulatory Visit (INDEPENDENT_AMBULATORY_CARE_PROVIDER_SITE_OTHER): Payer: Medicare Other | Admitting: Internal Medicine

## 2015-04-18 VITALS — BP 124/74 | HR 84 | Ht 63.0 in | Wt 173.0 lb

## 2015-04-18 DIAGNOSIS — I10 Essential (primary) hypertension: Secondary | ICD-10-CM

## 2015-04-18 DIAGNOSIS — I442 Atrioventricular block, complete: Secondary | ICD-10-CM | POA: Diagnosis not present

## 2015-04-18 NOTE — Progress Notes (Signed)
HPI [name] returns today for followup. She is a very pleasant 75 year old woman with a history of complete heart block, status post permanent pacemaker insertion. She also has hypertension. In the interim, she denies chest pain, shortness or syncope. No peripheral edema is present. She admits to some sodium indiscretion. She has undergone left sided knee replacement surgery 4 weeks ago and is doing well and almost done with rehab. Her pain is gone.  No Known Allergies   Current Outpatient Prescriptions  Medication Sig Dispense Refill  . amLODipine (NORVASC) 10 MG tablet Take 10 mg by mouth Daily.     Marland Kitchen lisinopril-hydrochlorothiazide (PRINZIDE,ZESTORETIC) 20-25 MG per tablet Take 1 tablet by mouth Daily.    . methocarbamol (ROBAXIN) 500 MG tablet Take 1 tablet (500 mg total) by mouth every 6 (six) hours as needed for muscle spasms. 80 tablet 0  . pravastatin (PRAVACHOL) 40 MG tablet Take 40 mg by mouth Daily.     Marland Kitchen SYNTHROID 112 MCG tablet Take 112 mcg by mouth Daily.      No current facility-administered medications for this visit.     Past Medical History  Diagnosis Date  . CHB (complete heart block) ([place]) 2007  . Arthritis   . Postmenopausal   . Essential hypertension, benign   . Cardiac pacemaker in situ   . Atrioventricular block, complete ([place])   . Hyperlipidemia   . Thyroid disease     hypothyroid  . History of skin cancer   . Frequency of urination     ROS:   All systems reviewed and negative except as noted in the HPI.   Past Surgical History  Procedure Laterality Date  . Pacemaker insertion  07-03-2005    [facility]   . Abdominal hysterectomy    . Bladder repair    . Total knee arthroplasty      right  . Colonoscopy    . Knee arthroscopy      rt  . Total knee arthroplasty Left 03/14/2015    Procedure: TOTAL KNEE ARTHROPLASTY;  Surgeon: Gaynelle Arabian, MD;  Location: [facility];  Service: Orthopedics;  Laterality: Left;     Family History   Problem Relation Age of Onset  . Lung cancer Mother   . Heart attack Father     pacemaker & defibrillator  . Colon cancer Neg Hx   . Esophageal cancer Neg Hx   . Stomach cancer Neg Hx   . Rectal cancer Neg Hx      Social History   Social History  . Marital Status: Married    Spouse Name: N/A  . Number of Children: N/A  . Years of Education: N/A   Occupational History  . retired    Social History Main Topics  . Smoking status: Former Smoker    Quit date: 03/06/1986  . Smokeless tobacco: Never Used  . Alcohol Use: Yes     Comment: wine rarely  . Drug Use: No  . Sexual Activity: Not on file   Other Topics Concern  . Not on file   Social History Narrative     BP 124/74 mmHg  Pulse 84  Ht 5\' 3"  (1.6 m)  Wt 173 lb (78.472 kg)  BMI 30.65 kg/m2  SpO2 85%  Physical Exam:  Well appearing 75 year old woman, NAD HEENT: Unremarkable Neck:  6 cm JVD, no thyromegally Back:  No CVA tenderness Lungs:  Clear with no wheezes, rales, or rhonchi. Well-healed pacemaker incision. HEART:  Regular rate rhythm, no murmurs,  no rubs, no clicks Abd:  soft, positive bowel sounds, no organomegally, no rebound, no guarding Ext:  2 plus pulses, no edema, no cyanosis, no clubbing, left knee incision is well healed. Skin:  No rashes no nodules Neuro:  CN II through XII intact, motor grossly intact  DEVICE  Normal device function.  See PaceArt for details.   Assess/Plan: 1. Complete heart block - she is doing well, s/p PPM insertion. Her device has a year left on the battery. 2. HTN - her blood pressure is well controlled. No change in meds. 3. Dyslipidemia - she will continue a low fat diet and pravachol.  Mikle Bosworth.D.

## 2015-04-18 NOTE — Patient Instructions (Signed)
Medication Instructions:  None ordered   Labwork: None ordered   Testing/Procedures: None ordered   Follow-Up: Your physician wants you to follow-up in: 12 months with Dr Knox Saliva will receive a reminder letter in the mail two months in advance. If you don't receive a letter, please call our office to schedule the follow-up appointment.      Any Other Special Instructions Will Be Listed Below (If Applicable).     If you need a refill on your cardiac medications before your next appointment, please call your pharmacy.

## 2015-06-03 LAB — CUP PACEART INCLINIC DEVICE CHECK
Implantable Lead Implant Date: 20070626
Implantable Lead Location: 753859
Implantable Lead Model: 4469
Implantable Lead Serial Number: 452570
Implantable Lead Serial Number: 480762
Lead Channel Pacing Threshold Amplitude: 0.4 V
Lead Channel Pacing Threshold Amplitude: 1 V
Lead Channel Pacing Threshold Pulse Width: 0.4 ms
Lead Channel Pacing Threshold Pulse Width: 0.4 ms
Lead Channel Sensing Intrinsic Amplitude: 5.8 mV
Lead Channel Setting Pacing Pulse Width: 0.4 ms
Lead Channel Setting Sensing Sensitivity: 2.5 mV
MDC IDC LEAD IMPLANT DT: 20070626
MDC IDC LEAD LOCATION: 753860
MDC IDC MSMT LEADCHNL RA SENSING INTR AMPL: 2.5 mV
MDC IDC SESS DTM: 20170526164520
MDC IDC SET LEADCHNL RA PACING AMPLITUDE: 2 V
MDC IDC SET LEADCHNL RV PACING AMPLITUDE: 2.4 V
MDC IDC STAT BRADY RA PERCENT PACED: 2 %
MDC IDC STAT BRADY RV PERCENT PACED: 100 %
Pulse Gen Model: 1297
Pulse Gen Serial Number: 316585

## 2015-07-13 ENCOUNTER — Encounter: Payer: Self-pay | Admitting: Internal Medicine

## 2015-07-20 ENCOUNTER — Telehealth: Payer: Self-pay

## 2015-07-20 ENCOUNTER — Encounter: Payer: Self-pay | Admitting: Internal Medicine

## 2015-07-20 NOTE — Telephone Encounter (Signed)
Spoke with several representatives from [employer]. Unable to reach Abby. Sharyn Lull took a message for her to call back.

## 2015-07-20 NOTE — Telephone Encounter (Signed)
Abby from [facility] is calling on behalf of patient due to pacemaker check abnormalities. Call back number [phone]

## 2015-07-20 NOTE — Telephone Encounter (Signed)
Abby calling back. Ms. Cheuvront TTM shows need for intensified follow up. She will fax TTM report.

## 2015-07-25 ENCOUNTER — Telehealth: Payer: Self-pay | Admitting: *Deleted

## 2015-07-25 NOTE — Telephone Encounter (Signed)
TTM shows need for intensified follow up for PPM battery longevity. Called [name]- she will come for PPM check 07/28/15 at 10:30am.

## 2015-07-28 ENCOUNTER — Ambulatory Visit (INDEPENDENT_AMBULATORY_CARE_PROVIDER_SITE_OTHER): Payer: Medicare Other | Admitting: *Deleted

## 2015-07-28 ENCOUNTER — Encounter: Payer: Self-pay | Admitting: Internal Medicine

## 2015-07-28 DIAGNOSIS — I442 Atrioventricular block, complete: Secondary | ICD-10-CM | POA: Diagnosis not present

## 2015-07-28 NOTE — Progress Notes (Signed)
Pacemaker check in clinic. Normal device function. Thresholds, sensing, impedances consistent with previous measurements. Device programmed to maximize longevity. No mode switch or high ventricular rates noted. Device programmed at appropriate safety margins. Histogram distribution appropriate for patient activity level. Device programmed to optimize intrinsic conduction. Estimated longevity 26yr. ROV with DC in 79mo.

## 2015-10-27 ENCOUNTER — Ambulatory Visit (INDEPENDENT_AMBULATORY_CARE_PROVIDER_SITE_OTHER): Payer: Medicare Other | Admitting: *Deleted

## 2015-10-27 DIAGNOSIS — I442 Atrioventricular block, complete: Secondary | ICD-10-CM

## 2015-10-27 DIAGNOSIS — Z950 Presence of cardiac pacemaker: Secondary | ICD-10-CM | POA: Diagnosis not present

## 2015-10-27 LAB — CUP PACEART INCLINIC DEVICE CHECK
Date Time Interrogation Session: 20171019040000
Implantable Lead Implant Date: 20070626
Implantable Lead Location: 753859
Implantable Lead Model: 4456
Implantable Lead Model: 4469
Lead Channel Impedance Value: 340 Ohm
Lead Channel Pacing Threshold Amplitude: 0.8 V
Lead Channel Pacing Threshold Pulse Width: 0.4 ms
Lead Channel Sensing Intrinsic Amplitude: 4.4 mV
Lead Channel Sensing Intrinsic Amplitude: 5.4 mV
Lead Channel Setting Pacing Amplitude: 2 V
Lead Channel Setting Pacing Pulse Width: 0.4 ms
Lead Channel Setting Sensing Sensitivity: 2.5 mV
MDC IDC LEAD IMPLANT DT: 20070626
MDC IDC LEAD LOCATION: 753860
MDC IDC LEAD SERIAL: 452570
MDC IDC LEAD SERIAL: 480762
MDC IDC MSMT LEADCHNL RA IMPEDANCE VALUE: 460 Ohm
MDC IDC MSMT LEADCHNL RA PACING THRESHOLD AMPLITUDE: 0.5 V
MDC IDC MSMT LEADCHNL RV PACING THRESHOLD PULSEWIDTH: 0.4 ms
MDC IDC SET LEADCHNL RV PACING AMPLITUDE: 2.4 V
MDC IDC STAT BRADY RA PERCENT PACED: 3 %
MDC IDC STAT BRADY RV PERCENT PACED: 100 %
Pulse Gen Model: 1297
Pulse Gen Serial Number: 316585

## 2015-10-27 NOTE — Progress Notes (Signed)
Pacemaker check in clinic. Normal device function. Thresholds, sensing, impedances consistent with previous measurements. Device programmed to maximize longevity. No mode switch or high ventricular rates noted. Device programmed at appropriate safety margins. Histogram distribution appropriate for patient activity level. Device programmed to optimize intrinsic conduction. Estimated longevity <0.5 years. Patient education completed. ROV with Device Clinic on 11/28/15 for battery check only (N/C, next billable 01/2016).

## 2015-11-28 ENCOUNTER — Ambulatory Visit (INDEPENDENT_AMBULATORY_CARE_PROVIDER_SITE_OTHER): Payer: Medicare Other | Admitting: *Deleted

## 2015-11-28 DIAGNOSIS — Z950 Presence of cardiac pacemaker: Secondary | ICD-10-CM

## 2015-11-28 NOTE — Progress Notes (Signed)
Battery check only. Battery <0.5 years. ROV with device clinic 12/28/2015 for battery check

## 2015-12-28 ENCOUNTER — Ambulatory Visit (INDEPENDENT_AMBULATORY_CARE_PROVIDER_SITE_OTHER): Payer: Medicare Other | Admitting: *Deleted

## 2015-12-28 DIAGNOSIS — I442 Atrioventricular block, complete: Secondary | ICD-10-CM

## 2015-12-28 DIAGNOSIS — Z950 Presence of cardiac pacemaker: Secondary | ICD-10-CM

## 2015-12-28 LAB — CUP PACEART INCLINIC DEVICE CHECK
Date Time Interrogation Session: 20171220050000
Implantable Lead Location: 753859
Implantable Lead Location: 753860
Implantable Lead Serial Number: 452570
Implantable Pulse Generator Implant Date: 20070626
Lead Channel Setting Pacing Amplitude: 2 V
MDC IDC LEAD IMPLANT DT: 20070626
MDC IDC LEAD IMPLANT DT: 20070626
MDC IDC LEAD SERIAL: 480762
MDC IDC PG SERIAL: 316585
MDC IDC SET LEADCHNL RV PACING AMPLITUDE: 2.4 V
MDC IDC SET LEADCHNL RV PACING PULSEWIDTH: 0.4 ms
MDC IDC SET LEADCHNL RV SENSING SENSITIVITY: 2.5 mV
MDC IDC STAT BRADY RA PERCENT PACED: 3 %
MDC IDC STAT BRADY RV PERCENT PACED: 100 %
Pulse Gen Model: 1297

## 2015-12-28 NOTE — Progress Notes (Signed)
Battery check only. Battery <0.5 years. 1 AT episode--6 sec duration. ROV with Device Clinic on 02/01/16 for full check. ROV with GT in 04/2016.

## 2016-02-01 ENCOUNTER — Ambulatory Visit (INDEPENDENT_AMBULATORY_CARE_PROVIDER_SITE_OTHER): Payer: Medicare Other | Admitting: *Deleted

## 2016-02-01 DIAGNOSIS — Z950 Presence of cardiac pacemaker: Secondary | ICD-10-CM

## 2016-02-01 DIAGNOSIS — I442 Atrioventricular block, complete: Secondary | ICD-10-CM | POA: Diagnosis not present

## 2016-02-01 LAB — CUP PACEART INCLINIC DEVICE CHECK
Brady Statistic RA Percent Paced: 3 %
Brady Statistic RV Percent Paced: 100 %
Date Time Interrogation Session: 20180124050000
Implantable Lead Implant Date: 20070626
Implantable Lead Location: 753859
Implantable Lead Location: 753860
Implantable Lead Model: 4456
Implantable Lead Serial Number: 452570
Lead Channel Pacing Threshold Amplitude: 0.8 V
Lead Channel Pacing Threshold Pulse Width: 0.4 ms
Lead Channel Setting Pacing Amplitude: 2.4 V
Lead Channel Setting Pacing Pulse Width: 0.4 ms
Lead Channel Setting Sensing Sensitivity: 2.5 mV
MDC IDC LEAD IMPLANT DT: 20070626
MDC IDC LEAD SERIAL: 480762
MDC IDC MSMT LEADCHNL RA IMPEDANCE VALUE: 460 Ohm
MDC IDC MSMT LEADCHNL RA PACING THRESHOLD AMPLITUDE: 0.8 V
MDC IDC MSMT LEADCHNL RA PACING THRESHOLD PULSEWIDTH: 0.4 ms
MDC IDC MSMT LEADCHNL RA SENSING INTR AMPL: 5 mV
MDC IDC MSMT LEADCHNL RV IMPEDANCE VALUE: 340 Ohm
MDC IDC PG IMPLANT DT: 20070626
MDC IDC PG SERIAL: 316585
MDC IDC SET LEADCHNL RA PACING AMPLITUDE: 2 V
Pulse Gen Model: 1297

## 2016-02-01 NOTE — Progress Notes (Signed)
Pacemaker check in clinic for battery check. Normal device function. Thresholds, sensing, impedances consistent with previous measurements. Device programmed to maximize longevity. No mode switch or high ventricular rates noted. Device programmed at appropriate safety margins. Histogram distribution appropriate for patient activity level. Device programmed to optimize intrinsic conduction. Estimated longevity <0.5 year. Return to device clinic 03/07/16 for battery check and with GT in April.

## 2016-03-07 ENCOUNTER — Ambulatory Visit (INDEPENDENT_AMBULATORY_CARE_PROVIDER_SITE_OTHER): Payer: Medicare Other | Admitting: *Deleted

## 2016-03-07 DIAGNOSIS — I442 Atrioventricular block, complete: Secondary | ICD-10-CM

## 2016-03-07 DIAGNOSIS — Z950 Presence of cardiac pacemaker: Secondary | ICD-10-CM

## 2016-03-07 LAB — CUP PACEART INCLINIC DEVICE CHECK
Brady Statistic RV Percent Paced: 100 %
Date Time Interrogation Session: 20180228050000
Implantable Lead Implant Date: 20070626
Implantable Lead Implant Date: 20070626
Implantable Lead Location: 753860
Implantable Lead Serial Number: 452570
Implantable Lead Serial Number: 480762
Lead Channel Setting Pacing Pulse Width: 0.4 ms
Lead Channel Setting Sensing Sensitivity: 2.5 mV
MDC IDC LEAD LOCATION: 753859
MDC IDC PG IMPLANT DT: 20070626
MDC IDC SET LEADCHNL RA PACING AMPLITUDE: 2 V
MDC IDC SET LEADCHNL RV PACING AMPLITUDE: 2.4 V
MDC IDC STAT BRADY RA PERCENT PACED: 5 %
Pulse Gen Model: 1297
Pulse Gen Serial Number: 316585

## 2016-03-07 NOTE — Progress Notes (Signed)
Battery check only. Remaining longevity <0.5 years. No new episodes. ROV with GT on 04/18/16.

## 2016-03-12 ENCOUNTER — Other Ambulatory Visit: Payer: Self-pay | Admitting: Dermatology

## 2016-03-12 DIAGNOSIS — C439 Malignant melanoma of skin, unspecified: Secondary | ICD-10-CM

## 2016-03-12 HISTORY — DX: Malignant melanoma of skin, unspecified: C43.9

## 2016-04-12 ENCOUNTER — Other Ambulatory Visit: Payer: Self-pay | Admitting: Dermatology

## 2016-04-18 ENCOUNTER — Encounter: Payer: Self-pay | Admitting: *Deleted

## 2016-04-18 ENCOUNTER — Encounter: Payer: Self-pay | Admitting: Internal Medicine

## 2016-04-18 ENCOUNTER — Ambulatory Visit (INDEPENDENT_AMBULATORY_CARE_PROVIDER_SITE_OTHER): Payer: Medicare Other | Admitting: Internal Medicine

## 2016-04-18 VITALS — BP 100/60 | HR 75 | Ht 63.0 in | Wt 185.0 lb

## 2016-04-18 DIAGNOSIS — Z950 Presence of cardiac pacemaker: Secondary | ICD-10-CM

## 2016-04-18 DIAGNOSIS — I442 Atrioventricular block, complete: Secondary | ICD-10-CM | POA: Diagnosis not present

## 2016-04-18 LAB — CUP PACEART INCLINIC DEVICE CHECK
Date Time Interrogation Session: 20180411194217
Implantable Lead Implant Date: 20070626
Implantable Lead Location: 753859
Implantable Lead Location: 753860
Implantable Lead Model: 4456
Implantable Lead Serial Number: 452570
Implantable Pulse Generator Implant Date: 20070626
MDC IDC LEAD IMPLANT DT: 20070626
MDC IDC LEAD SERIAL: 480762
Pulse Gen Model: 1297
Pulse Gen Serial Number: 316585

## 2016-04-18 NOTE — Patient Instructions (Addendum)
Your physician recommends that you continue on your current medications as directed. Please refer to the Current Medication list given to you today.   Your physician recommends that you schedule a follow-up appointment in:  AFTER  BATTERY CHANGE---04/23/16  Please report to the [facility] at 7:30am Do not eat or drink after midnight the night prior to the procedure Plan for one night stay but will likely go home Use scrub the morning of procedure according to directions Okay to take your medications the morning of the procedure with a sip of water Will need someone to drive you home at discharge      Your physician recommends that you return for lab work in:  [place]  BMET INR  AND  CBC

## 2016-04-18 NOTE — Progress Notes (Signed)
HPI [name] returns today for followup. She is a very pleasant 76 year old woman with a history of complete heart block, status post permanent pacemaker insertion. She also has hypertension. In the interim, she denies chest pain, shortness or syncope. No peripheral edema is present. She has reached ERI on her PPM.  No Known Allergies   Current Outpatient Prescriptions  Medication Sig Dispense Refill  . amLODipine (NORVASC) 10 MG tablet Take 10 mg by mouth Daily.     Marland Kitchen lisinopril-hydrochlorothiazide (PRINZIDE,ZESTORETIC) 20-25 MG per tablet Take 1 tablet by mouth Daily.    . pravastatin (PRAVACHOL) 40 MG tablet Take 40 mg by mouth Daily.     Marland Kitchen SYNTHROID 112 MCG tablet Take 112 mcg by mouth Daily.      No current facility-administered medications for this visit.      Past Medical History:  Diagnosis Date  . Arthritis   . Atrioventricular block, complete ([place])   . Cardiac pacemaker in situ   . CHB (complete heart block) ([place]) 2007  . Essential hypertension, benign   . Frequency of urination   . History of skin cancer   . Hyperlipidemia   . Postmenopausal   . Thyroid disease    hypothyroid    ROS:   All systems reviewed and negative except as noted in the HPI.   Past Surgical History:  Procedure Laterality Date  . ABDOMINAL HYSTERECTOMY    . BLADDER REPAIR    . COLONOSCOPY    . KNEE ARTHROSCOPY     rt  . PACEMAKER INSERTION  07-03-2005   [facility]   . TOTAL KNEE ARTHROPLASTY     right  . TOTAL KNEE ARTHROPLASTY Left 03/14/2015   Procedure: TOTAL KNEE ARTHROPLASTY;  Surgeon: Gaynelle Arabian, MD;  Location: [facility];  Service: Orthopedics;  Laterality: Left;     Family History  Problem Relation Age of Onset  . Lung cancer Mother   . Heart attack Father     pacemaker & defibrillator  . Colon cancer Neg Hx   . Esophageal cancer Neg Hx   . Stomach cancer Neg Hx   . Rectal cancer Neg Hx      Social History   Social History  . Marital status:  Married    Spouse name: N/A  . Number of children: N/A  . Years of education: N/A   Occupational History  . retired    Social History Main Topics  . Smoking status: Former Smoker    Quit date: 03/06/1986  . Smokeless tobacco: Never Used  . Alcohol use Yes     Comment: wine rarely  . Drug use: No  . Sexual activity: Not on file   Other Topics Concern  . Not on file   Social History Narrative  . No narrative on file     BP 100/60   Pulse 75   Ht 5\' 3"  (1.6 m)   Wt 185 lb (83.9 kg)   SpO2 100%   BMI 32.77 kg/m   Physical Exam:  Well appearing 76 year old woman, NAD HEENT: Unremarkable Neck:  6 cm JVD, no thyromegally Back:  No CVA tenderness Lungs:  Clear with no wheezes, rales, or rhonchi. Well-healed pacemaker incision. HEART:  Regular rate rhythm, no murmurs, no rubs, no clicks Abd:  soft, positive bowel sounds, no organomegally, no rebound, no guarding Ext:  2 plus pulses, no edema, no cyanosis, no clubbing, left knee incision is well healed. Skin:  No rashes no nodules Neuro:  CN II  through XII intact, motor grossly intact  DEVICE  Normal device function.  See PaceArt for details. We manually checked her RV pacing threshold and it is less than a volt.  Assess/Plan: 1. Complete heart block - she is doing well, s/p PPM insertion. She has an escape of about 32/min. 2. HTN - her blood pressure is well controlled. No change in meds. 3. Dyslipidemia - she will continue a low fat diet and pravachol. 4. PPM - her b[place] Sci DDD pm is working normally but has reached ERI. She will undergo PPM gen change in the next week or so.  Mikle Bosworth.D.

## 2016-04-19 LAB — CBC WITH DIFFERENTIAL/PLATELET
BASOS ABS: 0 10*3/uL (ref 0.0–0.2)
Basos: 0 %
EOS (ABSOLUTE): 0.2 10*3/uL (ref 0.0–0.4)
Eos: 3 %
Hematocrit: 40.1 % (ref 34.0–46.6)
Hemoglobin: 13.3 g/dL (ref 11.1–15.9)
IMMATURE GRANS (ABS): 0 10*3/uL (ref 0.0–0.1)
IMMATURE GRANULOCYTES: 0 %
LYMPHS: 40 %
Lymphocytes Absolute: 2.7 10*3/uL (ref 0.7–3.1)
MCH: 29.2 pg (ref 26.6–33.0)
MCHC: 33.2 g/dL (ref 31.5–35.7)
MCV: 88 fL (ref 79–97)
Monocytes Absolute: 0.4 10*3/uL (ref 0.1–0.9)
Monocytes: 6 %
NEUTROS PCT: 51 %
Neutrophils Absolute: 3.4 10*3/uL (ref 1.4–7.0)
Platelets: 230 10*3/uL (ref 150–379)
RBC: 4.56 x10E6/uL (ref 3.77–5.28)
RDW: 14.8 % (ref 12.3–15.4)
WBC: 6.8 10*3/uL (ref 3.4–10.8)

## 2016-04-19 LAB — BASIC METABOLIC PANEL
BUN/Creatinine Ratio: 26 (ref 12–28)
BUN: 25 mg/dL (ref 8–27)
CHLORIDE: 100 mmol/L (ref 96–106)
CO2: 25 mmol/L (ref 18–29)
Calcium: 9.9 mg/dL (ref 8.7–10.3)
Creatinine, Ser: 0.98 mg/dL (ref 0.57–1.00)
GFR calc Af Amer: 65 mL/min/{1.73_m2} (ref >59)
GFR calc non Af Amer: 57 mL/min/{1.73_m2} — ABNORMAL LOW (ref >59)
GLUCOSE: 82 mg/dL (ref 65–99)
Potassium: 4.2 mmol/L (ref 3.5–5.2)
Sodium: 140 mmol/L (ref 134–144)

## 2016-04-19 LAB — PROTIME-INR
INR: 1 (ref 0.8–1.2)
PROTHROMBIN TIME: 10.4 s (ref 9.1–12.0)

## 2016-04-23 ENCOUNTER — Encounter (HOSPITAL_COMMUNITY): Payer: Self-pay | Admitting: Internal Medicine

## 2016-04-23 ENCOUNTER — Ambulatory Visit (HOSPITAL_COMMUNITY)
Admission: RE | Admit: 2016-04-23 | Discharge: 2016-04-23 | Disposition: A | Discharge status: Home / Self Care | Payer: Medicare Other | Source: Ambulatory Visit | Attending: Internal Medicine | Admitting: Internal Medicine

## 2016-04-23 ENCOUNTER — Encounter (HOSPITAL_COMMUNITY)
Admission: RE | Disposition: A | Discharge status: Home / Self Care | Payer: Self-pay | Source: Ambulatory Visit | Attending: Internal Medicine

## 2016-04-23 DIAGNOSIS — E039 Hypothyroidism, unspecified: Secondary | ICD-10-CM | POA: Diagnosis not present

## 2016-04-23 DIAGNOSIS — M1990 Unspecified osteoarthritis, unspecified site: Secondary | ICD-10-CM | POA: Insufficient documentation

## 2016-04-23 DIAGNOSIS — Z950 Presence of cardiac pacemaker: Secondary | ICD-10-CM | POA: Diagnosis present

## 2016-04-23 DIAGNOSIS — Z87891 Personal history of nicotine dependence: Secondary | ICD-10-CM | POA: Diagnosis not present

## 2016-04-23 DIAGNOSIS — E785 Hyperlipidemia, unspecified: Secondary | ICD-10-CM | POA: Insufficient documentation

## 2016-04-23 DIAGNOSIS — I10 Essential (primary) hypertension: Secondary | ICD-10-CM | POA: Diagnosis not present

## 2016-04-23 DIAGNOSIS — Z45010 Encounter for checking and testing of cardiac pacemaker pulse generator [battery]: Secondary | ICD-10-CM | POA: Insufficient documentation

## 2016-04-23 DIAGNOSIS — I442 Atrioventricular block, complete: Secondary | ICD-10-CM | POA: Diagnosis present

## 2016-04-23 HISTORY — PX: PPM GENERATOR CHANGEOUT: EP1233

## 2016-04-23 LAB — SURGICAL PCR SCREEN
MRSA, PCR: NEGATIVE
STAPHYLOCOCCUS AUREUS: NEGATIVE

## 2016-04-23 SURGERY — PPM GENERATOR CHANGEOUT
Anesthesia: LOCAL

## 2016-04-23 MED ORDER — CEFAZOLIN SODIUM-DEXTROSE 2-4 GM/100ML-% IV SOLN
INTRAVENOUS | Status: AC
  Filled 2016-04-23: qty 100

## 2016-04-23 MED ORDER — MUPIROCIN 2 % EX OINT
TOPICAL_OINTMENT | CUTANEOUS | Status: AC
  Administered 2016-04-23: 1
  Filled 2016-04-23: qty 22

## 2016-04-23 MED ORDER — MUPIROCIN 2 % EX OINT: 1.0000 "application " | TOPICAL_OINTMENT | Freq: Once | CUTANEOUS | Status: DC

## 2016-04-23 MED ORDER — FENTANYL CITRATE (PF) 100 MCG/2ML IJ SOLN
INTRAMUSCULAR | Status: AC
  Filled 2016-04-23: qty 2

## 2016-04-23 MED ORDER — FENTANYL CITRATE (PF) 100 MCG/2ML IJ SOLN
INTRAMUSCULAR | Status: DC | PRN
  Administered 2016-04-23 (×2): 12.5 ug via INTRAVENOUS

## 2016-04-23 MED ORDER — SODIUM CHLORIDE 0.9 % IR SOLN
80.0000 mg | Status: AC
  Administered 2016-04-23: 80 mg

## 2016-04-23 MED ORDER — ONDANSETRON HCL 4 MG/2ML IJ SOLN: 4.0000 mg | Freq: Four times a day (QID) | INTRAMUSCULAR | Status: DC | PRN

## 2016-04-23 MED ORDER — LIDOCAINE HCL (PF) 1 % IJ SOLN
INTRAMUSCULAR | Status: AC
  Filled 2016-04-23: qty 60

## 2016-04-23 MED ORDER — MIDAZOLAM HCL 5 MG/5ML IJ SOLN
INTRAMUSCULAR | Status: DC | PRN
  Administered 2016-04-23 (×2): 1 mg via INTRAVENOUS

## 2016-04-23 MED ORDER — SODIUM CHLORIDE 0.9 % IV SOLN
INTRAVENOUS | Status: DC
  Administered 2016-04-23: 08:00:00 via INTRAVENOUS

## 2016-04-23 MED ORDER — LIDOCAINE HCL (PF) 1 % IJ SOLN
INTRAMUSCULAR | Status: DC | PRN
  Administered 2016-04-23: 45 mL

## 2016-04-23 MED ORDER — MIDAZOLAM HCL 5 MG/5ML IJ SOLN
INTRAMUSCULAR | Status: AC
  Filled 2016-04-23: qty 5

## 2016-04-23 MED ORDER — ACETAMINOPHEN 325 MG PO TABS: 325.0000 mg | ORAL_TABLET | ORAL | Status: DC | PRN

## 2016-04-23 MED ORDER — CEFAZOLIN SODIUM-DEXTROSE 2-4 GM/100ML-% IV SOLN
2.0000 g | INTRAVENOUS | Status: AC
  Administered 2016-04-23: 2 g via INTRAVENOUS

## 2016-04-23 MED ORDER — CHLORHEXIDINE GLUCONATE 4 % EX LIQD: 60.0000 mL | Freq: Once | CUTANEOUS | Status: DC

## 2016-04-23 MED ORDER — SODIUM CHLORIDE 0.9 % IR SOLN
Status: AC
  Filled 2016-04-23: qty 2

## 2016-04-23 SURGICAL SUPPLY — 4 items
CABLE SURGICAL S-101-97-12 (CABLE) ×3 IMPLANT
PACEMAKER ACCOLADE DR-EL (Pacemaker) ×1 IMPLANT
PAD DEFIB LIFELINK (PAD) ×1 IMPLANT
TRAY PACEMAKER INSERTION (PACKS) ×1 IMPLANT

## 2016-04-23 NOTE — H&P (View-Only) (Signed)
HPI [name] returns today for followup. She is a very pleasant 76 year old woman with a history of complete heart block, status post permanent pacemaker insertion. She also has hypertension. In the interim, she denies chest pain, shortness or syncope. No peripheral edema is present. She has reached ERI on her PPM.  No Known Allergies   Current Outpatient Prescriptions  Medication Sig Dispense Refill  . amLODipine (NORVASC) 10 MG tablet Take 10 mg by mouth Daily.     Marland Kitchen lisinopril-hydrochlorothiazide (PRINZIDE,ZESTORETIC) 20-25 MG per tablet Take 1 tablet by mouth Daily.    . pravastatin (PRAVACHOL) 40 MG tablet Take 40 mg by mouth Daily.     Marland Kitchen SYNTHROID 112 MCG tablet Take 112 mcg by mouth Daily.      No current facility-administered medications for this visit.      Past Medical History:  Diagnosis Date  . Arthritis   . Atrioventricular block, complete ([place])   . Cardiac pacemaker in situ   . CHB (complete heart block) ([place]) 2007  . Essential hypertension, benign   . Frequency of urination   . History of skin cancer   . Hyperlipidemia   . Postmenopausal   . Thyroid disease    hypothyroid    ROS:   All systems reviewed and negative except as noted in the HPI.   Past Surgical History:  Procedure Laterality Date  . ABDOMINAL HYSTERECTOMY    . BLADDER REPAIR    . COLONOSCOPY    . KNEE ARTHROSCOPY     rt  . PACEMAKER INSERTION  07-03-2005   [facility]   . TOTAL KNEE ARTHROPLASTY     right  . TOTAL KNEE ARTHROPLASTY Left 03/14/2015   Procedure: TOTAL KNEE ARTHROPLASTY;  Surgeon: Gaynelle Arabian, MD;  Location: [facility];  Service: Orthopedics;  Laterality: Left;     Family History  Problem Relation Age of Onset  . Lung cancer Mother   . Heart attack Father     pacemaker & defibrillator  . Colon cancer Neg Hx   . Esophageal cancer Neg Hx   . Stomach cancer Neg Hx   . Rectal cancer Neg Hx      Social History   Social History  . Marital status:  Married    Spouse name: N/A  . Number of children: N/A  . Years of education: N/A   Occupational History  . retired    Social History Main Topics  . Smoking status: Former Smoker    Quit date: 03/06/1986  . Smokeless tobacco: Never Used  . Alcohol use Yes     Comment: wine rarely  . Drug use: No  . Sexual activity: Not on file   Other Topics Concern  . Not on file   Social History Narrative  . No narrative on file     BP 100/60   Pulse 75   Ht 5\' 3"  (1.6 m)   Wt 185 lb (83.9 kg)   SpO2 100%   BMI 32.77 kg/m   Physical Exam:  Well appearing 76 year old woman, NAD HEENT: Unremarkable Neck:  6 cm JVD, no thyromegally Back:  No CVA tenderness Lungs:  Clear with no wheezes, rales, or rhonchi. Well-healed pacemaker incision. HEART:  Regular rate rhythm, no murmurs, no rubs, no clicks Abd:  soft, positive bowel sounds, no organomegally, no rebound, no guarding Ext:  2 plus pulses, no edema, no cyanosis, no clubbing, left knee incision is well healed. Skin:  No rashes no nodules Neuro:  CN II  through XII intact, motor grossly intact  DEVICE  Normal device function.  See PaceArt for details. We manually checked her RV pacing threshold and it is less than a volt.  Assess/Plan: 1. Complete heart block - she is doing well, s/p PPM insertion. She has an escape of about 32/min. 2. HTN - her blood pressure is well controlled. No change in meds. 3. Dyslipidemia - she will continue a low fat diet and pravachol. 4. PPM - her [place] Sci DDD pm is working normally but has reached ERI. She will undergo PPM gen change in the next week or so.  Mikle Bosworth.D.

## 2016-04-23 NOTE — Discharge Instructions (Signed)

## 2016-04-23 NOTE — Interval H&P Note (Signed)
History and Physical Interval Note:  04/23/2016 10:35 AM  [name]  has presented today for surgery, with the diagnosis of eri  The various methods of treatment have been discussed with the patient and family. After consideration of risks, benefits and other options for treatment, the patient has consented to  Procedure(s): PPM Generator Changeout (N/A) as a surgical intervention .  The patient's history has been reviewed, patient examined, no change in status, stable for surgery.  I have reviewed the patient's chart and labs.  Questions were answered to the patient's satisfaction.     Cristopher Peru

## 2016-04-30 ENCOUNTER — Ambulatory Visit (INDEPENDENT_AMBULATORY_CARE_PROVIDER_SITE_OTHER): Payer: Medicare Other | Admitting: *Deleted

## 2016-04-30 DIAGNOSIS — I442 Atrioventricular block, complete: Secondary | ICD-10-CM | POA: Diagnosis not present

## 2016-04-30 DIAGNOSIS — Z950 Presence of cardiac pacemaker: Secondary | ICD-10-CM | POA: Diagnosis not present

## 2016-04-30 LAB — CUP PACEART INCLINIC DEVICE CHECK
Brady Statistic RA Percent Paced: 16 %
Date Time Interrogation Session: 20180423040000
Implantable Lead Implant Date: 20070626
Implantable Lead Location: 753860
Implantable Lead Model: 4456
Implantable Lead Serial Number: 452570
Lead Channel Impedance Value: 354 Ohm
Lead Channel Pacing Threshold Amplitude: 1 V
Lead Channel Pacing Threshold Pulse Width: 0.4 ms
Lead Channel Pacing Threshold Pulse Width: 0.4 ms
Lead Channel Setting Pacing Amplitude: 1.5 V
Lead Channel Setting Pacing Amplitude: 2 V
MDC IDC LEAD IMPLANT DT: 20070626
MDC IDC LEAD LOCATION: 753859
MDC IDC LEAD SERIAL: 480762
MDC IDC MSMT LEADCHNL RA IMPEDANCE VALUE: 424 Ohm
MDC IDC MSMT LEADCHNL RA PACING THRESHOLD AMPLITUDE: 0.8 V
MDC IDC MSMT LEADCHNL RA SENSING INTR AMPL: 5.8 mV
MDC IDC MSMT LEADCHNL RV SENSING INTR AMPL: 7.8 mV
MDC IDC PG IMPLANT DT: 20180416
MDC IDC PG SERIAL: 782681
MDC IDC SET LEADCHNL RV PACING PULSEWIDTH: 0.4 ms
MDC IDC SET LEADCHNL RV SENSING SENSITIVITY: 2.5 mV
MDC IDC STAT BRADY RV PERCENT PACED: 99 %

## 2016-04-30 NOTE — Progress Notes (Signed)
Wound check appointment s/p generator replacement 04/23/16. Steri-strips removed. Wound without redness or edema. Incision edges approximated, wound well healed. Normal device function. Thresholds, sensing, and impedances consistent with implant measurements. Histogram distribution appropriate for patient and level of activity. No mode switches or high ventricular rates noted. PMT episodes are automatic RV threshold testing. Patient educated about wound care, arm mobility, lifting restrictions. Joey Deakins set up [employer] at visit. ROV with Dr. Lovena Le 08/07/16.

## 2016-07-24 ENCOUNTER — Encounter: Payer: Self-pay | Admitting: *Deleted

## 2016-08-07 ENCOUNTER — Ambulatory Visit (INDEPENDENT_AMBULATORY_CARE_PROVIDER_SITE_OTHER): Payer: Medicare Other | Admitting: Internal Medicine

## 2016-08-07 ENCOUNTER — Encounter: Payer: Self-pay | Admitting: Internal Medicine

## 2016-08-07 VITALS — BP 112/74 | HR 71 | Ht 63.0 in | Wt 188.2 lb

## 2016-08-07 DIAGNOSIS — Z950 Presence of cardiac pacemaker: Secondary | ICD-10-CM | POA: Diagnosis not present

## 2016-08-07 DIAGNOSIS — I442 Atrioventricular block, complete: Secondary | ICD-10-CM

## 2016-08-07 DIAGNOSIS — I10 Essential (primary) hypertension: Secondary | ICD-10-CM

## 2016-08-07 LAB — CUP PACEART INCLINIC DEVICE CHECK
Date Time Interrogation Session: 20180731040000
Implantable Lead Implant Date: 20070626
Implantable Lead Location: 753860
Implantable Lead Model: 4469
Implantable Lead Serial Number: 480762
Lead Channel Sensing Intrinsic Amplitude: 5.5 mV
Lead Channel Setting Pacing Amplitude: 1.5 V
Lead Channel Setting Pacing Amplitude: 2 V
Lead Channel Setting Pacing Pulse Width: 0.4 ms
MDC IDC LEAD IMPLANT DT: 20070626
MDC IDC LEAD LOCATION: 753859
MDC IDC LEAD SERIAL: 452570
MDC IDC MSMT LEADCHNL RA IMPEDANCE VALUE: 450 Ohm
MDC IDC MSMT LEADCHNL RA PACING THRESHOLD AMPLITUDE: 0.4 V
MDC IDC MSMT LEADCHNL RA PACING THRESHOLD PULSEWIDTH: 0.4 ms
MDC IDC MSMT LEADCHNL RV IMPEDANCE VALUE: 364 Ohm
MDC IDC PG IMPLANT DT: 20180416
MDC IDC SET LEADCHNL RV SENSING SENSITIVITY: 2.5 mV
Pulse Gen Serial Number: 782681

## 2016-08-07 NOTE — Patient Instructions (Signed)

## 2016-08-08 NOTE — Progress Notes (Signed)
HPI [name] returns today for ongoing followup of her CHB, s/p PPM insertion, HTN, and dyslipidemia. In the interim, she has undergone PM generator change out. She feels well and denies chest pain or sob.  No Known Allergies   Current Outpatient Prescriptions  Medication Sig Dispense Refill  . amLODipine (NORVASC) 10 MG tablet Take 10 mg by mouth Daily.     Marland Kitchen aspirin EC 81 MG tablet Take 81 mg by mouth daily.    Marland Kitchen levothyroxine (SYNTHROID, LEVOTHROID) 100 MCG tablet Take 100 mcg by mouth daily.    Marland Kitchen lisinopril-hydrochlorothiazide (PRINZIDE,ZESTORETIC) 20-25 MG per tablet Take 1 tablet by mouth Daily.    . pravastatin (PRAVACHOL) 40 MG tablet Take 40 mg by mouth Daily.      No current facility-administered medications for this visit.      Past Medical History:  Diagnosis Date  . Arthritis   . Atrioventricular block, complete ([place])   . Cardiac pacemaker in situ   . CHB (complete heart block) ([place]) 2007  . Essential hypertension, benign   . Frequency of urination   . History of skin cancer   . Hyperlipidemia   . Postmenopausal   . Thyroid disease    hypothyroid    ROS:   All systems reviewed and negative except as noted in the HPI.   Past Surgical History:  Procedure Laterality Date  . ABDOMINAL HYSTERECTOMY    . BLADDER REPAIR    . COLONOSCOPY    . KNEE ARTHROSCOPY     rt  . PACEMAKER INSERTION  07-03-2005   [facility]   . PPM GENERATOR CHANGEOUT N/A 04/23/2016   Procedure: PPM Generator Changeout;  Surgeon: Samantha Lance, MD;  Location: [facility];  Service: Cardiovascular;  Laterality: N/A;  . TOTAL KNEE ARTHROPLASTY     right  . TOTAL KNEE ARTHROPLASTY Left 03/14/2015   Procedure: TOTAL KNEE ARTHROPLASTY;  Surgeon: Samantha Arabian, MD;  Location: [facility];  Service: Orthopedics;  Laterality: Left;     Family History  Problem Relation Age of Onset  . Lung cancer Mother   . Heart attack Father        pacemaker & defibrillator  .  Colon cancer Neg Hx   . Esophageal cancer Neg Hx   . Stomach cancer Neg Hx   . Rectal cancer Neg Hx      Social History   Social History  . Marital status: Widowed    Spouse name: N/A  . Number of children: N/A  . Years of education: N/A   Occupational History  . retired    Social History Main Topics  . Smoking status: Former Smoker    Quit date: 03/06/1986  . Smokeless tobacco: Never Used  . Alcohol use Yes     Comment: wine rarely  . Drug use: No  . Sexual activity: Not on file   Other Topics Concern  . Not on file   Social History Narrative  . No narrative on file     BP 112/74   Pulse 71   Ht 5\' 3"  (1.6 m)   Wt 188 lb 3.2 oz (85.4 kg)   SpO2 97%   BMI 33.34 kg/m   Physical Exam:  Well appearing 76 yo woman, NAD HEENT: Unremarkable Neck:  6 cm JVD, no thyromegally Lymphatics:  No adenopathy Back:  No CVA tenderness Lungs:  Clear with no wheezes and well healed PPM incision. HEART:  Regular rate rhythm, no  murmurs, no rubs, no clicks Abd:  soft, positive bowel sounds, no organomegally, no rebound, no guarding Ext:  2 plus pulses, no edema, no cyanosis, no clubbing Skin:  No rashes no nodules Neuro:  CN II through XII intact, motor grossly intact  EKG - NSR with ventricular pacing  DEVICE  Normal device function.  See PaceArt for details.   Assess/Plan: 1. CHB - she is s/p PPM insertion. Asymptomatic 2. HTN - her blood pressure is well controlled. No change in meds. 3. PPM - her [employer] device is working normally. Will recheck in several months.   [name].D.

## 2016-11-06 ENCOUNTER — Ambulatory Visit (INDEPENDENT_AMBULATORY_CARE_PROVIDER_SITE_OTHER): Payer: Medicare Other | Admitting: *Deleted

## 2016-11-06 DIAGNOSIS — I442 Atrioventricular block, complete: Secondary | ICD-10-CM

## 2016-11-06 NOTE — Progress Notes (Signed)
Remote pacemaker transmission.   

## 2016-11-09 LAB — CUP PACEART REMOTE DEVICE CHECK
Battery Remaining Longevity: 174 mo
Battery Remaining Percentage: 100 %
Brady Statistic RA Percent Paced: 11 %
Date Time Interrogation Session: 20181030093600
Implantable Lead Implant Date: 20070626
Implantable Lead Location: 753859
Implantable Lead Location: 753860
Implantable Lead Model: 4456
Implantable Lead Serial Number: 480762
Implantable Pulse Generator Implant Date: 20180416
Lead Channel Impedance Value: 426 Ohm
Lead Channel Pacing Threshold Pulse Width: 0.4 ms
Lead Channel Pacing Threshold Pulse Width: 0.4 ms
Lead Channel Setting Pacing Amplitude: 1.6 V
MDC IDC LEAD IMPLANT DT: 20070626
MDC IDC LEAD SERIAL: 452570
MDC IDC MSMT LEADCHNL RA PACING THRESHOLD AMPLITUDE: 0.4 V
MDC IDC MSMT LEADCHNL RV IMPEDANCE VALUE: 348 Ohm
MDC IDC MSMT LEADCHNL RV PACING THRESHOLD AMPLITUDE: 1.1 V
MDC IDC PG SERIAL: 782681
MDC IDC SET LEADCHNL RA PACING AMPLITUDE: 2 V
MDC IDC SET LEADCHNL RV PACING PULSEWIDTH: 0.4 ms
MDC IDC SET LEADCHNL RV SENSING SENSITIVITY: 2.5 mV
MDC IDC STAT BRADY RV PERCENT PACED: 100 %

## 2016-11-14 ENCOUNTER — Encounter: Payer: Self-pay | Admitting: Cardiology

## 2017-02-05 ENCOUNTER — Telehealth: Payer: Self-pay | Admitting: Cardiology

## 2017-02-05 ENCOUNTER — Ambulatory Visit (INDEPENDENT_AMBULATORY_CARE_PROVIDER_SITE_OTHER): Payer: Medicare Other | Admitting: *Deleted

## 2017-02-05 DIAGNOSIS — I442 Atrioventricular block, complete: Secondary | ICD-10-CM

## 2017-02-05 NOTE — Telephone Encounter (Signed)
Spoke with pt and reminded pt of remote transmission that is due today. Pt verbalized understanding.   

## 2017-02-06 NOTE — Progress Notes (Signed)
Remote pacemaker transmission.   

## 2017-02-07 ENCOUNTER — Encounter: Payer: Self-pay | Admitting: Cardiology

## 2017-02-25 LAB — CUP PACEART REMOTE DEVICE CHECK
Battery Remaining Percentage: 100 %
Brady Statistic RA Percent Paced: 10 %
Implantable Lead Implant Date: 20070626
Implantable Lead Implant Date: 20070626
Implantable Lead Location: 753859
Implantable Lead Model: 4469
Implantable Lead Serial Number: 480762
Implantable Pulse Generator Implant Date: 20180416
Lead Channel Impedance Value: 342 Ohm
Lead Channel Pacing Threshold Amplitude: 0.5 V
Lead Channel Pacing Threshold Amplitude: 0.9 V
Lead Channel Pacing Threshold Pulse Width: 0.4 ms
Lead Channel Pacing Threshold Pulse Width: 0.4 ms
Lead Channel Setting Pacing Pulse Width: 0.4 ms
Lead Channel Setting Sensing Sensitivity: 2.5 mV
MDC IDC LEAD LOCATION: 753860
MDC IDC LEAD SERIAL: 452570
MDC IDC MSMT BATTERY REMAINING LONGEVITY: 180 mo
MDC IDC MSMT LEADCHNL RA IMPEDANCE VALUE: 426 Ohm
MDC IDC SESS DTM: 20190129191200
MDC IDC SET LEADCHNL RA PACING AMPLITUDE: 2 V
MDC IDC SET LEADCHNL RV PACING AMPLITUDE: 1.4 V
MDC IDC STAT BRADY RV PERCENT PACED: 100 %
Pulse Gen Serial Number: 782681

## 2017-03-08 ENCOUNTER — Other Ambulatory Visit: Payer: Self-pay

## 2017-05-07 ENCOUNTER — Ambulatory Visit (INDEPENDENT_AMBULATORY_CARE_PROVIDER_SITE_OTHER): Payer: Medicare Other | Admitting: *Deleted

## 2017-05-07 DIAGNOSIS — I442 Atrioventricular block, complete: Secondary | ICD-10-CM

## 2017-05-07 NOTE — Progress Notes (Signed)
Remote pacemaker transmission.   

## 2017-05-08 ENCOUNTER — Encounter: Payer: Self-pay | Admitting: Cardiology

## 2017-05-08 LAB — CUP PACEART REMOTE DEVICE CHECK
Battery Remaining Percentage: 100 %
Brady Statistic RA Percent Paced: 10 %
Implantable Lead Implant Date: 20070626
Implantable Lead Implant Date: 20070626
Implantable Lead Location: 753859
Implantable Lead Model: 4469
Implantable Lead Serial Number: 480762
Implantable Pulse Generator Implant Date: 20180416
Lead Channel Impedance Value: 349 Ohm
Lead Channel Pacing Threshold Amplitude: 0.5 V
Lead Channel Pacing Threshold Pulse Width: 0.4 ms
Lead Channel Pacing Threshold Pulse Width: 0.4 ms
Lead Channel Setting Pacing Amplitude: 1.7 V
Lead Channel Setting Pacing Pulse Width: 0.4 ms
Lead Channel Setting Sensing Sensitivity: 2.5 mV
MDC IDC LEAD LOCATION: 753860
MDC IDC LEAD SERIAL: 452570
MDC IDC MSMT BATTERY REMAINING LONGEVITY: 168 mo
MDC IDC MSMT LEADCHNL RA IMPEDANCE VALUE: 426 Ohm
MDC IDC MSMT LEADCHNL RV PACING THRESHOLD AMPLITUDE: 1.1 V
MDC IDC SESS DTM: 20190430091200
MDC IDC SET LEADCHNL RA PACING AMPLITUDE: 2 V
MDC IDC STAT BRADY RV PERCENT PACED: 100 %
Pulse Gen Serial Number: 782681

## 2017-08-21 ENCOUNTER — Encounter: Payer: Self-pay | Admitting: Internal Medicine

## 2017-08-21 ENCOUNTER — Ambulatory Visit (INDEPENDENT_AMBULATORY_CARE_PROVIDER_SITE_OTHER): Payer: Medicare Other | Admitting: Internal Medicine

## 2017-08-21 ENCOUNTER — Encounter (INDEPENDENT_AMBULATORY_CARE_PROVIDER_SITE_OTHER): Payer: Self-pay

## 2017-08-21 VITALS — BP 104/68 | HR 69 | Ht 63.0 in | Wt 186.0 lb

## 2017-08-21 DIAGNOSIS — I442 Atrioventricular block, complete: Secondary | ICD-10-CM | POA: Diagnosis not present

## 2017-08-21 DIAGNOSIS — Z950 Presence of cardiac pacemaker: Secondary | ICD-10-CM

## 2017-08-21 DIAGNOSIS — I10 Essential (primary) hypertension: Secondary | ICD-10-CM | POA: Diagnosis not present

## 2017-08-21 NOTE — Patient Instructions (Signed)
Medication Instructions:  Your physician recommends that you continue on your current medications as directed. Please refer to the Current Medication list given to you today.  Labwork: None ordered.  Testing/Procedures: None ordered.  Follow-Up: Your physician wants you to follow-up in: one year with Dr. Lovena Le.   You will receive a reminder letter in the mail two months in advance. If you don't receive a letter, please call our office to schedule the follow-up appointment.  Remote monitoring is used to monitor your Pacemaker from home. This monitoring reduces the number of office visits required to check your device to one time per year. It allows [place] to keep an eye on the functioning of your device to ensure it is working properly. You are scheduled for a device check from home on 09/10/2017. You may send your transmission at any time that day. If you have a wireless device, the transmission will be sent automatically. After your physician reviews your transmission, you will receive a postcard with your next transmission date.  Any Other Special Instructions Will Be Listed Below (If Applicable).  If you need a refill on your cardiac medications before your next appointment, please call your pharmacy.

## 2017-08-21 NOTE — Progress Notes (Signed)
HPI [name] returns today for followup of her CHB and HTN. She is a pleasant 77 yo woman who undewent PPM insertion in 2018. She has done well in the interim. No chest pain or sob. No syncope. No edema. She admits to dietary indiscretion.  No Known Allergies   Current Outpatient Medications  Medication Sig Dispense Refill  . amLODipine (NORVASC) 10 MG tablet Take 10 mg by mouth Daily.     Marland Kitchen aspirin EC 81 MG tablet Take 81 mg by mouth daily.    Marland Kitchen levothyroxine (SYNTHROID, LEVOTHROID) 100 MCG tablet Take 100 mcg by mouth daily.    Marland Kitchen lisinopril-hydrochlorothiazide (PRINZIDE,ZESTORETIC) 20-25 MG per tablet Take 1 tablet by mouth Daily.    . pravastatin (PRAVACHOL) 40 MG tablet Take 40 mg by mouth Daily.      No current facility-administered medications for this visit.      Past Medical History:  Diagnosis Date  . Arthritis   . Atrioventricular block, complete ([facility])   . Cardiac pacemaker in situ   . CHB (complete heart block) ([place]) 2007  . Essential hypertension, benign   . Frequency of urination   . History of skin cancer   . Hyperlipidemia   . Postmenopausal   . Thyroid disease    hypothyroid    ROS:   All systems reviewed and negative except as noted in the HPI.   Past Surgical History:  Procedure Laterality Date  . ABDOMINAL HYSTERECTOMY    . BLADDER REPAIR    . COLONOSCOPY    . KNEE ARTHROSCOPY     rt  . PACEMAKER INSERTION  07-03-2005   [facility]   . PPM GENERATOR CHANGEOUT N/A 04/23/2016   Procedure: PPM Generator Changeout;  Surgeon: Evans Lance, MD;  Location: [facility];  Service: Cardiovascular;  Laterality: N/A;  . TOTAL KNEE ARTHROPLASTY     right  . TOTAL KNEE ARTHROPLASTY Left 03/14/2015   Procedure: TOTAL KNEE ARTHROPLASTY;  Surgeon: Gaynelle Arabian, MD;  Location: [facility];  Service: Orthopedics;  Laterality: Left;     Family History  Problem Relation Age of Onset  . Lung cancer Mother   . Heart attack Father       pacemaker & defibrillator  . Colon cancer Neg Hx   . Esophageal cancer Neg Hx   . Stomach cancer Neg Hx   . Rectal cancer Neg Hx      Social History   Socioeconomic History  . Marital status: Widowed    Spouse name: Not on file  . Number of children: Not on file  . Years of education: Not on file  . Highest education level: Not on file  Occupational History  . Occupation: retired  Scientific laboratory technician  . Financial resource strain: Not on file  . Food insecurity:    Worry: Not on file    Inability: Not on file  . Transportation needs:    Medical: Not on file    Non-medical: Not on file  Tobacco Use  . Smoking status: Former Smoker    Last attempt to quit: 03/06/1986    Years since quitting: 31.4  . Smokeless tobacco: Never Used  Substance and Sexual Activity  . Alcohol use: Yes    Comment: wine rarely  . Drug use: No  . Sexual activity: Not on file  Lifestyle  . Physical activity:    Days per week: Not on file    Minutes per session: Not on file  .  Stress: Not on file  Relationships  . Social connections:    Talks on phone: Not on file    Gets together: Not on file    Attends religious service: Not on file    Active member of club or organization: Not on file    Attends meetings of clubs or organizations: Not on file    Relationship status: Not on file  . Intimate partner violence:    Fear of current or ex partner: Not on file    Emotionally abused: Not on file    Physically abused: Not on file    Forced sexual activity: Not on file  Other Topics Concern  . Not on file  Social History Narrative  . Not on file     BP 104/68   Pulse 69   Ht 5\' 3"  (1.6 m)   Wt 186 lb (84.4 kg)   SpO2 99%   BMI 32.95 kg/m   Physical Exam:  Well appearing 77 yo woman, NAD HEENT: Unremarkable Neck:  6 cm JVD, no thyromegally Lymphatics:  No adenopathy Back:  No CVA tenderness Lungs:  Clear with no wheezes HEART:  Regular rate rhythm, no murmurs, no rubs, no clicks Abd:   soft, positive bowel sounds, no organomegally, no rebound, no guarding Ext:  2 plus pulses, no edema, no cyanosis, no clubbing Skin:  No rashes no nodules Neuro:  CN II through XII intact, motor grossly intact  EKG - NSR with p synchronous ventricular pacing  DEVICE  Normal device function.  See PaceArt for details.   Assess/Plan: 1. CHB - she has an escape today in the mid 30's.  2. PPM - her [employer] DDD PM is working normally. 3. HTN - her blood pressure today is well controllled. We will follow.  Mikle Bosworth.D.

## 2017-08-23 LAB — CUP PACEART INCLINIC DEVICE CHECK
Brady Statistic RV Percent Paced: 100 %
Date Time Interrogation Session: 20190814040000
Implantable Lead Implant Date: 20070626
Implantable Lead Location: 753859
Implantable Lead Model: 4456
Lead Channel Impedance Value: 383 Ohm
Lead Channel Impedance Value: 467 Ohm
Lead Channel Pacing Threshold Pulse Width: 0.4 ms
Lead Channel Sensing Intrinsic Amplitude: 11.7 mV
Lead Channel Setting Sensing Sensitivity: 2.5 mV
MDC IDC LEAD IMPLANT DT: 20070626
MDC IDC LEAD LOCATION: 753860
MDC IDC LEAD SERIAL: 452570
MDC IDC LEAD SERIAL: 480762
MDC IDC MSMT LEADCHNL RA PACING THRESHOLD AMPLITUDE: 0.4 V
MDC IDC MSMT LEADCHNL RA SENSING INTR AMPL: 6.8 mV
MDC IDC MSMT LEADCHNL RV PACING THRESHOLD AMPLITUDE: 1.1 V
MDC IDC MSMT LEADCHNL RV PACING THRESHOLD PULSEWIDTH: 0.4 ms
MDC IDC PG IMPLANT DT: 20180416
MDC IDC PG SERIAL: 782681
MDC IDC SET LEADCHNL RA PACING AMPLITUDE: 2 V
MDC IDC SET LEADCHNL RV PACING AMPLITUDE: 1.6 V
MDC IDC SET LEADCHNL RV PACING PULSEWIDTH: 0.4 ms
MDC IDC STAT BRADY RA PERCENT PACED: 10 %

## 2017-09-10 ENCOUNTER — Ambulatory Visit (INDEPENDENT_AMBULATORY_CARE_PROVIDER_SITE_OTHER): Payer: Medicare Other | Admitting: *Deleted

## 2017-09-10 DIAGNOSIS — I442 Atrioventricular block, complete: Secondary | ICD-10-CM | POA: Diagnosis not present

## 2017-09-10 NOTE — Progress Notes (Signed)
Remote pacemaker transmission.   

## 2017-10-01 LAB — CUP PACEART REMOTE DEVICE CHECK
Date Time Interrogation Session: 20190924092247
Implantable Lead Implant Date: 20070626
Implantable Lead Implant Date: 20070626
Implantable Lead Location: 753859
Implantable Lead Location: 753860
Implantable Lead Model: 4456
Implantable Lead Model: 4469
Implantable Lead Serial Number: 452570
Implantable Lead Serial Number: 480762
Implantable Pulse Generator Implant Date: 20180416
Pulse Gen Serial Number: 782681

## 2017-12-10 ENCOUNTER — Ambulatory Visit (INDEPENDENT_AMBULATORY_CARE_PROVIDER_SITE_OTHER): Payer: Medicare Other

## 2017-12-10 DIAGNOSIS — I442 Atrioventricular block, complete: Secondary | ICD-10-CM

## 2017-12-11 NOTE — Progress Notes (Signed)
Remote pacemaker transmission.   

## 2017-12-17 ENCOUNTER — Encounter: Payer: Self-pay | Admitting: Cardiology

## 2018-01-27 LAB — CUP PACEART REMOTE DEVICE CHECK
Battery Remaining Longevity: 156 mo
Battery Remaining Percentage: 100 %
Implantable Lead Implant Date: 20070626
Implantable Lead Implant Date: 20070626
Implantable Lead Location: 753860
Implantable Lead Model: 4469
Implantable Lead Serial Number: 452570
Implantable Pulse Generator Implant Date: 20180416
Lead Channel Impedance Value: 324 Ohm
Lead Channel Pacing Threshold Amplitude: 0.4 V
Lead Channel Pacing Threshold Pulse Width: 0.4 ms
Lead Channel Setting Pacing Amplitude: 2 V
Lead Channel Setting Pacing Pulse Width: 0.4 ms
Lead Channel Setting Sensing Sensitivity: 2.5 mV
MDC IDC LEAD LOCATION: 753859
MDC IDC LEAD SERIAL: 480762
MDC IDC MSMT LEADCHNL RA IMPEDANCE VALUE: 405 Ohm
MDC IDC MSMT LEADCHNL RA PACING THRESHOLD PULSEWIDTH: 0.4 ms
MDC IDC MSMT LEADCHNL RV PACING THRESHOLD AMPLITUDE: 0.9 V
MDC IDC SESS DTM: 20191203102600
MDC IDC SET LEADCHNL RV PACING AMPLITUDE: 1.5 V
MDC IDC STAT BRADY RA PERCENT PACED: 13 %
MDC IDC STAT BRADY RV PERCENT PACED: 100 %
Pulse Gen Serial Number: 782681

## 2018-03-11 ENCOUNTER — Ambulatory Visit (INDEPENDENT_AMBULATORY_CARE_PROVIDER_SITE_OTHER): Payer: PPO | Admitting: *Deleted

## 2018-03-11 DIAGNOSIS — I442 Atrioventricular block, complete: Secondary | ICD-10-CM

## 2018-03-12 LAB — CUP PACEART REMOTE DEVICE CHECK
Battery Remaining Longevity: 162 mo
Brady Statistic RV Percent Paced: 100 %
Implantable Lead Implant Date: 20070626
Implantable Lead Location: 753860
Implantable Lead Model: 4456
Implantable Lead Model: 4469
Implantable Lead Serial Number: 452570
Implantable Lead Serial Number: 480762
Implantable Pulse Generator Implant Date: 20180416
Lead Channel Impedance Value: 346 Ohm
Lead Channel Impedance Value: 447 Ohm
Lead Channel Pacing Threshold Amplitude: 0.4 V
Lead Channel Pacing Threshold Amplitude: 1 V
Lead Channel Pacing Threshold Pulse Width: 0.4 ms
Lead Channel Setting Pacing Amplitude: 1.8 V
Lead Channel Setting Pacing Amplitude: 2 V
Lead Channel Setting Pacing Pulse Width: 0.4 ms
Lead Channel Setting Sensing Sensitivity: 2.5 mV
MDC IDC LEAD IMPLANT DT: 20070626
MDC IDC LEAD LOCATION: 753859
MDC IDC MSMT BATTERY REMAINING PERCENTAGE: 100 %
MDC IDC MSMT LEADCHNL RV PACING THRESHOLD PULSEWIDTH: 0.4 ms
MDC IDC PG SERIAL: 782681
MDC IDC SESS DTM: 20200303100200
MDC IDC STAT BRADY RA PERCENT PACED: 12 %

## 2018-03-17 DIAGNOSIS — Z1231 Encounter for screening mammogram for malignant neoplasm of breast: Secondary | ICD-10-CM | POA: Diagnosis not present

## 2018-03-19 ENCOUNTER — Encounter: Payer: Self-pay | Admitting: Cardiology

## 2018-03-19 NOTE — Progress Notes (Signed)
Remote pacemaker transmission.   

## 2018-05-09 ENCOUNTER — Other Ambulatory Visit: Payer: Self-pay | Admitting: *Deleted

## 2018-05-09 NOTE — Patient Outreach (Signed)
[facility]) Care Management  05/09/2018  [name] November 14, 1940 [id number]   RN Health coach sent a welcome letter, HTA packet as a [employer] health plan.   Plan: Next follow up outreach within the month of October  [name] Management [phone]

## 2018-06-10 ENCOUNTER — Ambulatory Visit (INDEPENDENT_AMBULATORY_CARE_PROVIDER_SITE_OTHER): Payer: PPO | Admitting: *Deleted

## 2018-06-10 DIAGNOSIS — I442 Atrioventricular block, complete: Secondary | ICD-10-CM

## 2018-06-11 LAB — CUP PACEART REMOTE DEVICE CHECK
Battery Remaining Longevity: 150 mo
Battery Remaining Percentage: 100 %
Brady Statistic RA Percent Paced: 11 %
Brady Statistic RV Percent Paced: 100 %
Date Time Interrogation Session: 20200602090100
Implantable Lead Implant Date: 20070626
Implantable Lead Implant Date: 20070626
Implantable Lead Location: 753859
Implantable Lead Location: 753860
Implantable Lead Model: 4456
Implantable Lead Model: 4469
Implantable Lead Serial Number: 452570
Implantable Lead Serial Number: 480762
Implantable Pulse Generator Implant Date: 20180416
Lead Channel Impedance Value: 350 Ohm
Lead Channel Impedance Value: 445 Ohm
Lead Channel Pacing Threshold Amplitude: 0.4 V
Lead Channel Pacing Threshold Amplitude: 1.1 V
Lead Channel Pacing Threshold Pulse Width: 0.4 ms
Lead Channel Pacing Threshold Pulse Width: 0.4 ms
Lead Channel Setting Pacing Amplitude: 1.7 V
Lead Channel Setting Pacing Amplitude: 2 V
Lead Channel Setting Pacing Pulse Width: 0.4 ms
Lead Channel Setting Sensing Sensitivity: 2.5 mV
Pulse Gen Serial Number: 782681

## 2018-06-18 ENCOUNTER — Encounter: Payer: Self-pay | Admitting: Cardiology

## 2018-06-18 NOTE — Progress Notes (Signed)
Remote pacemaker transmission.   

## 2018-06-27 ENCOUNTER — Other Ambulatory Visit: Payer: Self-pay | Admitting: *Deleted

## 2018-06-27 NOTE — Patient Outreach (Signed)
[facility]) Care Management  06/27/2018  [name] Nov 29, 1940 [id number]   RN Health Coach is closing this program. Consumer is enrolled in [place] [employer] external program.  [name] Care Management [phone]

## 2018-07-24 ENCOUNTER — Encounter: Payer: Self-pay | Admitting: *Deleted

## 2018-08-25 NOTE — Progress Notes (Signed)
Cardiology Office Note Date:  09/01/2018  Patient ID:  Samantha, Scott Jul 22, 1940, MRN [id number] PCP:  Mayra Neer, MD  Electrophysiologist:  Dr. Lovena Le    Chief Complaint:   annual visit  History of Present Illness: [name] is a 78 y.o. female with history of hypothyroidism, HTN, HLD, CHB w/PPM.  She comes in today to be seen for Dr. Lovena Le, last seen by him Aug 2019.  At that time she was doing well, no changes were made to her medicines or device, she has an escape rhythm in the 30's at that visit.  She feels quite well.  No CP, palpitations, SOB, no difficulties with ADLs, cares for her lawn, and recently getting back in her recumbent bike for exercise.  No near syncope or syncope.  She follows labs with her PMD  Device information BSCi dual chamber PPM implanted 06/2005, gen change 06/23/16  PACER DEPENDENT today   Past Medical History:  Diagnosis Date  . Arthritis   . Atrioventricular block, complete ([place])   . Cardiac pacemaker in situ   . CHB (complete heart block) ([place]) 2007  . Essential hypertension, benign   . Frequency of urination   . History of skin cancer   . Hyperlipidemia   . Malignant melanoma ([place]) 03/12/2016   LEFT KNEE- TX WITH EXCISION  . Postmenopausal   . SCC (squamous cell carcinoma) 10/27/2014   SCC/KA-LEFT SHIN- TX CURET X 3,5FU  . Thyroid disease    hypothyroid    Past Surgical History:  Procedure Laterality Date  . ABDOMINAL HYSTERECTOMY    . BLADDER REPAIR    . COLONOSCOPY    . KNEE ARTHROSCOPY     rt  . PACEMAKER INSERTION  07-03-2005   [facility]AA   . PPM GENERATOR CHANGEOUT N/A 04/23/2016   Procedure: PPM Generator Changeout;  Surgeon: Evans Lance, MD;  Location: [facility];  Service: Cardiovascular;  Laterality: N/A;  . TOTAL KNEE ARTHROPLASTY     right  . TOTAL KNEE ARTHROPLASTY Left 03/14/2015   Procedure: TOTAL KNEE ARTHROPLASTY;  Surgeon: Gaynelle Arabian, MD;  [facility];   Service: Orthopedics;  Laterality: Left;    Current Outpatient Medications  Medication Sig Dispense Refill  . amLODipine (NORVASC) 10 MG tablet Take 10 mg by mouth Daily.     Marland Kitchen aspirin EC 81 MG tablet Take 81 mg by mouth daily.    Marland Kitchen levothyroxine (SYNTHROID, LEVOTHROID) 100 MCG tablet Take 100 mcg by mouth daily.    Marland Kitchen lisinopril-hydrochlorothiazide (PRINZIDE,ZESTORETIC) 20-25 MG per tablet Take 1 tablet by mouth Daily.    . pravastatin (PRAVACHOL) 40 MG tablet Take 40 mg by mouth Daily.      No current facility-administered medications for this visit.     Allergies:   Patient has no known allergies.   Social History:  The patient  reports that she quit smoking about 32 years ago. She has never used smokeless tobacco. She reports current alcohol use. She reports that she does not use drugs.   Family History:  The patient's family history includes Heart attack in her father; Lung cancer in her mother.  ROS:  Please see the history of present illness.  All other systems are reviewed and otherwise negative.   PHYSICAL EXAM:  VS:  BP 114/64   Pulse 72   Ht 5\' 3"  (1.6 m)   Wt 185 lb (83.9 kg)   BMI 32.77 kg/m  BMI: Body mass index is 32.77  kg/m. Well nourished, well developed, in no acute distress  HEENT: normocephalic, atraumatic  Neck: no JVD, carotid bruits or masses Cardiac:  RRR; no significant murmurs, no rubs, or gallops Lungs:  CTA b/l, no wheezing, rhonchi or rales  Abd: soft, nontender MS: no deformity or atrophy Ext: no edema  Skin: warm and dry, no rash Neuro:  No gross deficits appreciated Psych: euthymic mood, full affect  PPM site is stable, no tethering or discomfort   EKG:  Done today and reviewed by myslf shows  AR/VP, 72bpm PPM interrogation done today and reviewed by myself; battery and lead measurements are good.  No R waves today at 30. 11% AP 100% VP One AFlutter episode in Jan 8 seconds, none since, burden is <1%   07/03/2005: TTE SUMMARY  -  Overall left ventricular systolic function was normal. Left     ventricular ejection fraction was estimated to be 60 %. There     were no left ventricular regional wall motion abnormalities.  - There was mild mitral valvular regurgitation.  - Mild pulmonary hypertension   Recent Labs: No results found for requested labs within last 8760 hours.  No results found for requested labs within last 8760 hours.   CrCl cannot be calculated (Patient's most recent lab result is older than the maximum 21 days allowed.).   Wt Readings from Last 3 Encounters:  09/01/18 185 lb (83.9 kg)  08/21/17 186 lb (84.4 kg)  08/07/16 188 lb 3.2 oz (85.4 kg)     Other studies reviewed: Additional studies/records reviewed today include: summarized above  ASSESSMENT AND PLAN:  1. PPM     Intact function, no programming changes made today     Dependent today  2. HTN     Looks good    We discussed importance of diet, exercise, weight managemnt, cardiac healthy life style    Disposition: F/u with Q 3 month remotes and in-clinic in 1 year, sooner if needed.    Current medicines are reviewed at length with the patient today.  The patient did not have any concerns regarding medicines.  Venetia Night, PA-C 09/01/2018 8:54 AM     [address] [place] [address] [phone] (office)  [phone] (fax)

## 2018-09-01 ENCOUNTER — Encounter: Payer: Self-pay | Admitting: Physician Assistant

## 2018-09-01 ENCOUNTER — Other Ambulatory Visit: Payer: Self-pay

## 2018-09-01 ENCOUNTER — Ambulatory Visit (INDEPENDENT_AMBULATORY_CARE_PROVIDER_SITE_OTHER): Payer: PPO | Admitting: Physician Assistant

## 2018-09-01 VITALS — BP 114/64 | HR 72 | Ht 63.0 in | Wt 185.0 lb

## 2018-09-01 DIAGNOSIS — I10 Essential (primary) hypertension: Secondary | ICD-10-CM | POA: Diagnosis not present

## 2018-09-01 DIAGNOSIS — I442 Atrioventricular block, complete: Secondary | ICD-10-CM

## 2018-09-01 DIAGNOSIS — Z950 Presence of cardiac pacemaker: Secondary | ICD-10-CM | POA: Diagnosis not present

## 2018-09-01 NOTE — Patient Instructions (Addendum)
Medication Instructions:  Your physician recommends that you continue on your current medications as directed. Please refer to the Current Medication list given to you today.  If you need a refill on your cardiac medications before your next appointment, please call your pharmacy.   Lab work: NONE ORDERED  TODAY   If you have labs (blood work) drawn today and your tests are completely normal, you will receive your results only by: . MyChart Message (if you have MyChart) OR . A paper copy in the mail If you have any lab test that is abnormal or we need to change your treatment, we will call you to review the results.  Testing/Procedures: NONE ORDERED  TODAY  Follow-Up: At [facility], you and your health needs are our priority.  As part of our continuing mission to provide you with exceptional heart care, we have created designated Provider Care Teams.  These Care Teams include your primary Cardiologist (physician) and Advanced Practice Providers (APPs -  Physician Assistants and Nurse Practitioners) who all work together to provide you with the care you need, when you need it. You will need a follow up appointment in 1 years.  Please call our office 2 months in advance to schedule this appointment.  You may see Dr. Taylor  or one of the following Advanced Practice Providers on your designated Care Team:   Amber Seiler, NP . Renee Ursuy, PA-C . Andrew Tillery PA-C   Any Other Special Instructions Will Be Listed Below (If Applicable).    

## 2018-09-02 ENCOUNTER — Telehealth: Payer: Self-pay | Admitting: Internal Medicine

## 2018-09-02 NOTE — Telephone Encounter (Signed)
New message   Patient calling in to provide the medicate health number that was needed on yesterday.   Medicare Health Number: [id number]

## 2018-09-02 NOTE — Telephone Encounter (Signed)
I will route this call to Admin Supervisor Glenna Chrisco so that pt's Medicare number may be input into her chart.

## 2018-09-09 ENCOUNTER — Ambulatory Visit (INDEPENDENT_AMBULATORY_CARE_PROVIDER_SITE_OTHER): Payer: PPO | Admitting: *Deleted

## 2018-09-09 DIAGNOSIS — I442 Atrioventricular block, complete: Secondary | ICD-10-CM

## 2018-09-09 LAB — CUP PACEART REMOTE DEVICE CHECK
Battery Remaining Longevity: 144 mo
Battery Remaining Percentage: 100 %
Brady Statistic RA Percent Paced: 7 %
Brady Statistic RV Percent Paced: 100 %
Date Time Interrogation Session: 20200901090100
Implantable Lead Implant Date: 20070626
Implantable Lead Implant Date: 20070626
Implantable Lead Location: 753859
Implantable Lead Location: 753860
Implantable Lead Model: 4456
Implantable Lead Model: 4469
Implantable Lead Serial Number: 452570
Implantable Lead Serial Number: 480762
Implantable Pulse Generator Implant Date: 20180416
Lead Channel Impedance Value: 344 Ohm
Lead Channel Impedance Value: 426 Ohm
Lead Channel Pacing Threshold Amplitude: 0.5 V
Lead Channel Pacing Threshold Amplitude: 1.1 V
Lead Channel Pacing Threshold Pulse Width: 0.4 ms
Lead Channel Pacing Threshold Pulse Width: 0.4 ms
Lead Channel Setting Pacing Amplitude: 1.7 V
Lead Channel Setting Pacing Amplitude: 2 V
Lead Channel Setting Pacing Pulse Width: 0.4 ms
Lead Channel Setting Sensing Sensitivity: 2.5 mV
Pulse Gen Serial Number: 782681

## 2018-09-23 NOTE — Progress Notes (Signed)
Remote pacemaker transmission.   

## 2018-10-14 ENCOUNTER — Ambulatory Visit: Payer: PPO | Admitting: *Deleted

## 2018-10-23 DIAGNOSIS — I10 Essential (primary) hypertension: Secondary | ICD-10-CM | POA: Diagnosis not present

## 2018-10-23 DIAGNOSIS — E782 Mixed hyperlipidemia: Secondary | ICD-10-CM | POA: Diagnosis not present

## 2018-10-23 DIAGNOSIS — Z23 Encounter for immunization: Secondary | ICD-10-CM | POA: Diagnosis not present

## 2018-10-23 DIAGNOSIS — E559 Vitamin D deficiency, unspecified: Secondary | ICD-10-CM | POA: Diagnosis not present

## 2018-10-23 DIAGNOSIS — E039 Hypothyroidism, unspecified: Secondary | ICD-10-CM | POA: Diagnosis not present

## 2018-10-27 DIAGNOSIS — Z6832 Body mass index (BMI) 32.0-32.9, adult: Secondary | ICD-10-CM | POA: Diagnosis not present

## 2018-10-27 DIAGNOSIS — Z7189 Other specified counseling: Secondary | ICD-10-CM | POA: Diagnosis not present

## 2018-10-27 DIAGNOSIS — E039 Hypothyroidism, unspecified: Secondary | ICD-10-CM | POA: Diagnosis not present

## 2018-10-27 DIAGNOSIS — E559 Vitamin D deficiency, unspecified: Secondary | ICD-10-CM | POA: Diagnosis not present

## 2018-10-27 DIAGNOSIS — M1990 Unspecified osteoarthritis, unspecified site: Secondary | ICD-10-CM | POA: Diagnosis not present

## 2018-10-27 DIAGNOSIS — Z950 Presence of cardiac pacemaker: Secondary | ICD-10-CM | POA: Diagnosis not present

## 2018-10-27 DIAGNOSIS — Z0000 Encounter for general adult medical examination without abnormal findings: Secondary | ICD-10-CM | POA: Diagnosis not present

## 2018-10-27 DIAGNOSIS — E669 Obesity, unspecified: Secondary | ICD-10-CM | POA: Diagnosis not present

## 2018-10-27 DIAGNOSIS — I10 Essential (primary) hypertension: Secondary | ICD-10-CM | POA: Diagnosis not present

## 2018-10-27 DIAGNOSIS — E782 Mixed hyperlipidemia: Secondary | ICD-10-CM | POA: Diagnosis not present

## 2018-11-04 DIAGNOSIS — H524 Presbyopia: Secondary | ICD-10-CM | POA: Diagnosis not present

## 2018-12-09 ENCOUNTER — Ambulatory Visit (INDEPENDENT_AMBULATORY_CARE_PROVIDER_SITE_OTHER): Payer: PPO | Admitting: *Deleted

## 2018-12-09 DIAGNOSIS — I442 Atrioventricular block, complete: Secondary | ICD-10-CM

## 2018-12-09 LAB — CUP PACEART REMOTE DEVICE CHECK
Battery Remaining Longevity: 144 mo
Battery Remaining Percentage: 100 %
Brady Statistic RA Percent Paced: 12 %
Brady Statistic RV Percent Paced: 100 %
Date Time Interrogation Session: 20201201050000
Implantable Lead Implant Date: 20070626
Implantable Lead Implant Date: 20070626
Implantable Lead Location: 753859
Implantable Lead Location: 753860
Implantable Lead Model: 4456
Implantable Lead Model: 4469
Implantable Lead Serial Number: 452570
Implantable Lead Serial Number: 480762
Implantable Pulse Generator Implant Date: 20180416
Lead Channel Impedance Value: 341 Ohm
Lead Channel Impedance Value: 429 Ohm
Lead Channel Pacing Threshold Amplitude: 0.5 V
Lead Channel Pacing Threshold Amplitude: 0.9 V
Lead Channel Pacing Threshold Pulse Width: 0.4 ms
Lead Channel Pacing Threshold Pulse Width: 0.4 ms
Lead Channel Setting Pacing Amplitude: 1.7 V
Lead Channel Setting Pacing Amplitude: 2 V
Lead Channel Setting Pacing Pulse Width: 0.4 ms
Lead Channel Setting Sensing Sensitivity: 2.5 mV
Pulse Gen Serial Number: 782681

## 2019-01-03 NOTE — Progress Notes (Signed)
PPM remote 

## 2019-01-15 DIAGNOSIS — Z20822 Contact with and (suspected) exposure to covid-19: Secondary | ICD-10-CM | POA: Diagnosis not present

## 2019-03-10 ENCOUNTER — Ambulatory Visit (INDEPENDENT_AMBULATORY_CARE_PROVIDER_SITE_OTHER): Payer: PPO | Admitting: *Deleted

## 2019-03-10 DIAGNOSIS — I442 Atrioventricular block, complete: Secondary | ICD-10-CM | POA: Diagnosis not present

## 2019-03-10 LAB — CUP PACEART REMOTE DEVICE CHECK
Battery Remaining Longevity: 144 mo
Battery Remaining Percentage: 100 %
Brady Statistic RA Percent Paced: 12 %
Brady Statistic RV Percent Paced: 100 %
Date Time Interrogation Session: 20210302050100
Implantable Lead Implant Date: 20070626
Implantable Lead Implant Date: 20070626
Implantable Lead Location: 753859
Implantable Lead Location: 753860
Implantable Lead Model: 4456
Implantable Lead Model: 4469
Implantable Lead Serial Number: 452570
Implantable Lead Serial Number: 480762
Implantable Pulse Generator Implant Date: 20180416
Lead Channel Impedance Value: 356 Ohm
Lead Channel Impedance Value: 447 Ohm
Lead Channel Pacing Threshold Amplitude: 0.4 V
Lead Channel Pacing Threshold Amplitude: 0.8 V
Lead Channel Pacing Threshold Pulse Width: 0.4 ms
Lead Channel Pacing Threshold Pulse Width: 0.4 ms
Lead Channel Setting Pacing Amplitude: 1.7 V
Lead Channel Setting Pacing Amplitude: 2 V
Lead Channel Setting Pacing Pulse Width: 0.4 ms
Lead Channel Setting Sensing Sensitivity: 2.5 mV
Pulse Gen Serial Number: 782681

## 2019-03-11 NOTE — Progress Notes (Signed)
PPM Remote  

## 2019-03-23 DIAGNOSIS — Z1231 Encounter for screening mammogram for malignant neoplasm of breast: Secondary | ICD-10-CM | POA: Diagnosis not present

## 2019-03-23 DIAGNOSIS — Z13820 Encounter for screening for osteoporosis: Secondary | ICD-10-CM | POA: Diagnosis not present

## 2019-06-09 ENCOUNTER — Ambulatory Visit (INDEPENDENT_AMBULATORY_CARE_PROVIDER_SITE_OTHER): Payer: PPO | Admitting: *Deleted

## 2019-06-09 DIAGNOSIS — I442 Atrioventricular block, complete: Secondary | ICD-10-CM | POA: Diagnosis not present

## 2019-06-10 LAB — CUP PACEART REMOTE DEVICE CHECK
Battery Remaining Longevity: 144 mo
Battery Remaining Percentage: 100 %
Brady Statistic RA Percent Paced: 11 %
Brady Statistic RV Percent Paced: 100 %
Date Time Interrogation Session: 20210601050100
Implantable Lead Implant Date: 20070626
Implantable Lead Implant Date: 20070626
Implantable Lead Location: 753859
Implantable Lead Location: 753860
Implantable Lead Model: 4456
Implantable Lead Model: 4469
Implantable Lead Serial Number: 452570
Implantable Lead Serial Number: 480762
Implantable Pulse Generator Implant Date: 20180416
Lead Channel Impedance Value: 335 Ohm
Lead Channel Impedance Value: 425 Ohm
Lead Channel Pacing Threshold Amplitude: 0.4 V
Lead Channel Pacing Threshold Amplitude: 0.9 V
Lead Channel Pacing Threshold Pulse Width: 0.4 ms
Lead Channel Pacing Threshold Pulse Width: 0.4 ms
Lead Channel Setting Pacing Amplitude: 1.7 V
Lead Channel Setting Pacing Amplitude: 2 V
Lead Channel Setting Pacing Pulse Width: 0.4 ms
Lead Channel Setting Sensing Sensitivity: 2.5 mV
Pulse Gen Serial Number: 782681

## 2019-06-10 NOTE — Progress Notes (Signed)
Remote pacemaker transmission.   

## 2019-08-26 DIAGNOSIS — R300 Dysuria: Secondary | ICD-10-CM | POA: Diagnosis not present

## 2019-09-03 ENCOUNTER — Ambulatory Visit: Payer: PPO | Admitting: Internal Medicine

## 2019-09-03 ENCOUNTER — Encounter: Payer: Self-pay | Admitting: Internal Medicine

## 2019-09-03 ENCOUNTER — Other Ambulatory Visit: Payer: Self-pay

## 2019-09-03 VITALS — BP 90/54 | HR 80 | Ht 63.0 in | Wt 183.0 lb

## 2019-09-03 DIAGNOSIS — Z950 Presence of cardiac pacemaker: Secondary | ICD-10-CM

## 2019-09-03 DIAGNOSIS — I10 Essential (primary) hypertension: Secondary | ICD-10-CM

## 2019-09-03 DIAGNOSIS — I442 Atrioventricular block, complete: Secondary | ICD-10-CM | POA: Diagnosis not present

## 2019-09-03 LAB — CUP PACEART INCLINIC DEVICE CHECK
Brady Statistic RA Percent Paced: 12 %
Brady Statistic RV Percent Paced: 100 %
Date Time Interrogation Session: 20210826170520
Implantable Lead Implant Date: 20070626
Implantable Lead Implant Date: 20070626
Implantable Lead Location: 753859
Implantable Lead Location: 753860
Implantable Lead Model: 4456
Implantable Lead Model: 4469
Implantable Lead Serial Number: 452570
Implantable Lead Serial Number: 480762
Implantable Pulse Generator Implant Date: 20180416
Lead Channel Impedance Value: 375 Ohm
Lead Channel Impedance Value: 447 Ohm
Lead Channel Pacing Threshold Amplitude: 0.5 V
Lead Channel Pacing Threshold Pulse Width: 0.4 ms
Lead Channel Sensing Intrinsic Amplitude: 4 mV
Lead Channel Setting Pacing Amplitude: 1.3 V
Lead Channel Setting Pacing Amplitude: 2 V
Lead Channel Setting Pacing Pulse Width: 0.4 ms
Lead Channel Setting Sensing Sensitivity: 2.5 mV
Pulse Gen Serial Number: 782681

## 2019-09-03 NOTE — Patient Instructions (Signed)
Medication Instructions:  Your physician recommends that you continue on your current medications as directed. Please refer to the Current Medication list given to you today.  Labwork: None ordered.  Testing/Procedures: None ordered.  Follow-Up: Your physician wants you to follow-up in: one year with Dr. Lovena Le.   You will receive a reminder letter in the mail two months in advance. If you don't receive a letter, please call our office to schedule the follow-up appointment.  Remote monitoring is used to monitor your Pacemaker from home. This monitoring reduces the number of office visits required to check your device to one time per year. It allows [place] to keep an eye on the functioning of your device to ensure it is working properly. You are scheduled for a device check from home on 09/08/2019. You may send your transmission at any time that day. If you have a wireless device, the transmission will be sent automatically. After your physician reviews your transmission, you will receive a postcard with your next transmission date.  Any Other Special Instructions Will Be Listed Below (If Applicable).  If you need a refill on your cardiac medications before your next appointment, please call your pharmacy.

## 2019-09-03 NOTE — Progress Notes (Signed)
HPI [name] returns today for followup of her CHB and HTN. She is a pleasant 79 yo woman who undewent PPM insertion in 2018. She has done well in the interim. No chest pain or sob. No syncope. No edema. She admits to dietary indiscretion. She has minimal edema.  No Known Allergies   Current Outpatient Medications  Medication Sig Dispense Refill  . amLODipine (NORVASC) 10 MG tablet Take 10 mg by mouth Daily.     Marland Kitchen aspirin EC 81 MG tablet Take 81 mg by mouth daily.    Marland Kitchen levothyroxine (SYNTHROID, LEVOTHROID) 100 MCG tablet Take 100 mcg by mouth daily.    Marland Kitchen lisinopril-hydrochlorothiazide (PRINZIDE,ZESTORETIC) 20-25 MG per tablet Take 1 tablet by mouth Daily.    . pravastatin (PRAVACHOL) 40 MG tablet Take 40 mg by mouth Daily.      No current facility-administered medications for this visit.     Past Medical History:  Diagnosis Date  . Arthritis   . Atrioventricular block, complete ([place])   . Cardiac pacemaker in situ   . CHB (complete heart block) ([place]) 2007  . Essential hypertension, benign   . Frequency of urination   . History of skin cancer   . Hyperlipidemia   . Malignant melanoma ([place]) 03/12/2016   LEFT KNEE- TX WITH EXCISION  . Postmenopausal   . SCC (squamous cell carcinoma) 10/27/2014   SCC/KA-LEFT SHIN- TX CURET X 3,5FU  . Thyroid disease    hypothyroid    ROS:   All systems reviewed and negative except as noted in the HPI.   Past Surgical History:  Procedure Laterality Date  . ABDOMINAL HYSTERECTOMY    . BLADDER REPAIR    . COLONOSCOPY    . KNEE ARTHROSCOPY     rt  . PACEMAKER INSERTION  07-03-2005   [facility]   . PPM GENERATOR CHANGEOUT N/A 04/23/2016   Procedure: PPM Generator Changeout;  Surgeon: Samantha Lance, MD;  Location: [facility];  Service: Cardiovascular;  Laterality: N/A;  . TOTAL KNEE ARTHROPLASTY     right  . TOTAL KNEE ARTHROPLASTY Left 03/14/2015   Procedure: TOTAL KNEE ARTHROPLASTY;  Surgeon: Samantha Arabian, MD;  Location: [facility];  Service: Orthopedics;  Laterality: Left;     Family History  Problem Relation Age of Onset  . Lung cancer Mother   . Heart attack Father        pacemaker & defibrillator  . Colon cancer Neg Hx   . Esophageal cancer Neg Hx   . Stomach cancer Neg Hx   . Rectal cancer Neg Hx      Social History   Socioeconomic History  . Marital status: Widowed    Spouse name: Not on file  . Number of children: Not on file  . Years of education: Not on file  . Highest education level: Not on file  Occupational History  . Occupation: retired  Tobacco Use  . Smoking status: Former Smoker    Quit date: 03/06/1986    Years since quitting: 33.5  . Smokeless tobacco: Never Used  Substance and Sexual Activity  . Alcohol use: Yes    Comment: wine rarely  . Drug use: No  . Sexual activity: Not on file  Other Topics Concern  . Not on file  Social History Narrative  . Not on file   Social Determinants of Health   Financial Resource Strain:   . Difficulty of Paying Living Expenses: Not on file  Food Insecurity:   . Worried About Charity fundraiser in the Last Year: Not on file  . Ran Out of Food in the Last Year: Not on file  Transportation Needs:   . Lack of Transportation (Medical): Not on file  . Lack of Transportation (Non-Medical): Not on file  Physical Activity:   . Days of Exercise per Week: Not on file  . Minutes of Exercise per Session: Not on file  Stress:   . Feeling of Stress : Not on file  Social Connections:   . Frequency of Communication with Friends and Family: Not on file  . Frequency of Social Gatherings with Friends and Family: Not on file  . Attends Religious Services: Not on file  . Active Member of Clubs or Organizations: Not on file  . Attends Archivist Meetings: Not on file  . Marital Status: Not on file  Intimate Partner Violence:   . Fear of Current or Ex-Partner: Not on file  . Emotionally Abused: Not on file  .  Physically Abused: Not on file  . Sexually Abused: Not on file     BP (!) 90/54   Pulse 80   Ht 5\' 3"  (1.6 m)   Wt 183 lb (83 kg)   SpO2 94%   BMI 32.42 kg/m   Physical Exam:  Well appearing NAD HEENT: Unremarkable Neck:  No JVD, no thyromegally Lymphatics:  No adenopathy Back:  No CVA tenderness Lungs:  Clear with no wheezes HEART:  Regular rate rhythm, no murmurs, no rubs, no clicks Abd:  soft, positive bowel sounds, no organomegally, no rebound, no guarding Ext:  2 plus pulses, no edema, no cyanosis, no clubbing Skin:  No rashes no nodules Neuro:  CN II through XII intact, motor grossly intact  EKG - NSR with ventricular pacing  DEVICE  Normal device function.  See PaceArt for details.   Assess/Plan: 1. CHB - she is asymptomatic, s/p PPM insertion. 2. PPM - her [employer] DDD PM is working normally. 3. Obesity - she is encouraged to lose weight. 4. HTN - her bp is well controlled today. A little low though she is asymptomatic and not dizzy. I asked her to consider reducing the dose of amlodipine when she sees her primary MD.  [name]

## 2019-09-08 ENCOUNTER — Ambulatory Visit (INDEPENDENT_AMBULATORY_CARE_PROVIDER_SITE_OTHER): Payer: PPO | Admitting: *Deleted

## 2019-09-08 DIAGNOSIS — I442 Atrioventricular block, complete: Secondary | ICD-10-CM | POA: Diagnosis not present

## 2019-09-08 LAB — CUP PACEART REMOTE DEVICE CHECK
Battery Remaining Longevity: 126 mo
Battery Remaining Percentage: 100 %
Brady Statistic RA Percent Paced: 21 %
Brady Statistic RV Percent Paced: 100 %
Date Time Interrogation Session: 20210831050100
Implantable Lead Implant Date: 20070626
Implantable Lead Implant Date: 20070626
Implantable Lead Location: 753859
Implantable Lead Location: 753860
Implantable Lead Model: 4456
Implantable Lead Model: 4469
Implantable Lead Serial Number: 452570
Implantable Lead Serial Number: 480762
Implantable Pulse Generator Implant Date: 20180416
Lead Channel Impedance Value: 341 Ohm
Lead Channel Impedance Value: 437 Ohm
Lead Channel Pacing Threshold Amplitude: 0.5 V
Lead Channel Pacing Threshold Amplitude: 1.1 V
Lead Channel Pacing Threshold Pulse Width: 0.4 ms
Lead Channel Pacing Threshold Pulse Width: 0.4 ms
Lead Channel Setting Pacing Amplitude: 1.6 V
Lead Channel Setting Pacing Amplitude: 2 V
Lead Channel Setting Pacing Pulse Width: 0.4 ms
Lead Channel Setting Sensing Sensitivity: 2.5 mV
Pulse Gen Serial Number: 782681

## 2019-09-09 NOTE — Progress Notes (Signed)
Remote pacemaker transmission.   

## 2019-11-05 DIAGNOSIS — H524 Presbyopia: Secondary | ICD-10-CM | POA: Diagnosis not present

## 2019-11-05 DIAGNOSIS — H52223 Regular astigmatism, bilateral: Secondary | ICD-10-CM | POA: Diagnosis not present

## 2019-11-05 DIAGNOSIS — H40053 Ocular hypertension, bilateral: Secondary | ICD-10-CM | POA: Diagnosis not present

## 2019-11-05 DIAGNOSIS — H5203 Hypermetropia, bilateral: Secondary | ICD-10-CM | POA: Diagnosis not present

## 2019-11-05 DIAGNOSIS — I10 Essential (primary) hypertension: Secondary | ICD-10-CM | POA: Diagnosis not present

## 2019-11-05 DIAGNOSIS — H25813 Combined forms of age-related cataract, bilateral: Secondary | ICD-10-CM | POA: Diagnosis not present

## 2019-11-05 DIAGNOSIS — H35033 Hypertensive retinopathy, bilateral: Secondary | ICD-10-CM | POA: Diagnosis not present

## 2019-11-09 DIAGNOSIS — K625 Hemorrhage of anus and rectum: Secondary | ICD-10-CM | POA: Diagnosis not present

## 2019-11-09 DIAGNOSIS — I951 Orthostatic hypotension: Secondary | ICD-10-CM | POA: Diagnosis not present

## 2019-11-09 DIAGNOSIS — I10 Essential (primary) hypertension: Secondary | ICD-10-CM | POA: Diagnosis not present

## 2019-11-11 DIAGNOSIS — K625 Hemorrhage of anus and rectum: Secondary | ICD-10-CM | POA: Diagnosis not present

## 2019-11-17 DIAGNOSIS — K625 Hemorrhage of anus and rectum: Secondary | ICD-10-CM

## 2019-11-19 ENCOUNTER — Encounter: Payer: Self-pay | Admitting: Nurse Practitioner

## 2019-11-19 ENCOUNTER — Ambulatory Visit: Payer: PPO | Admitting: Nurse Practitioner

## 2019-11-19 VITALS — BP 108/70 | HR 71 | Ht 63.0 in | Wt 181.0 lb

## 2019-11-19 DIAGNOSIS — K559 Vascular disorder of intestine, unspecified: Secondary | ICD-10-CM | POA: Diagnosis not present

## 2019-11-19 DIAGNOSIS — Z86010 Personal history of colonic polyps: Secondary | ICD-10-CM | POA: Diagnosis not present

## 2019-11-19 MED ORDER — SUPREP BOWEL PREP KIT 17.5-3.13-1.6 GM/177ML PO SOLN: 1.0000 | ORAL | 0 refills | Status: DC

## 2019-11-19 NOTE — Patient Instructions (Signed)
If you are age 79 or older, your body mass index should be between 23-30. Your Body mass index is 32.06 kg/m. If this is out of the aforementioned range listed, please consider follow up with your Primary Care Provider.  If you are age 37 or younger, your body mass index should be between 19-25. Your Body mass index is 32.06 kg/m. If this is out of the aformentioned range listed, please consider follow up with your Primary Care Provider.   You have been scheduled for a colonoscopy. Please follow written instructions given to you at your visit today.  Please pick up your prep supplies at the pharmacy within the next 1-3 days. If you use inhalers (even only as needed), please bring them with you on the day of your procedure.  Due to recent changes in healthcare laws, you may see the results of your imaging and laboratory studies on MyChart before your provider has had a chance to review them.  We understand that in some cases there may be results that are confusing or concerning to you. Not all laboratory results come back in the same time frame and the provider may be waiting for multiple results in order to interpret others.  Please give [place] 48 hours in order for your provider to thoroughly review all the results before contacting the office for clarification of your results.   Please follow up with Dr Lovena Le prior to your colonoscopy scheduled for 01-13-2020 to discuss your recent syncopal episode.  Please call our office if bloody diarrhea was to recur before your colonoscopy.  Thank you for entrusting me with your care and choosing [facility].  Carl Best, NP

## 2019-11-19 NOTE — Progress Notes (Addendum)
11/19/2019 [name] [id number] March 31, 1940   Chief Complaint: Bloody diarrhea   History of Present Illness: [name] is a 79 year old female with a past medical history of arthritis, hypertension, hyperlipidemia, hypothyroidism, malignant melanoma, CHB s/p pacemaker 2007 with generator exchange 2018 and colon polyps. Past hysterectomy and and bladder repair. She presents to our office today as referred by Dr. Kelton Pillar for further evaluation for rectal bleeding. She reported feeling flushed on 11/08/2019 and she briefly "passed out" for 1 or 2 minutes when she was home alone. She went to the bathroom and passed had 2 episodes of red bloody diarrhea without any abdominal pain or cramping. She went to bed and the next morning on 11/1 she had one more episode of bloody diarrhea x 1. No further bloody diarrhea or rectal bleeding since then. She was seen in office by Dr. Laurann Montana on 11/1. She refused to go to the ED. Labs showed WBC 13.1. Hg 13.3. HCT 40.3. PLT 218. HR 114. BP 100/68 supine and 92/68 standing. Her Amlodipine dose was decreased from 10mg  to 5mg  QD and she was advised to follow up in our office for further GI evaluation. She remains on Lisinopril HCTZ 20-25 mg DQ and ASA 81mg  QD. No other NSAID use. She reports feeling quite well. No further bloody diarrhea. Her appetite is good. No upper or lower abdominal pain. No CP, dizziness, palpitations or SOB. She underwent a colonoscopy by Dr. Fuller Plan on 11/09/2014 which identified a 41mm sessile serrated polyp which was removed from the cecum. She was advised to repeat a colonoscopy in 5 years.    Past Surgical History:  Procedure Laterality Date  . ABDOMINAL HYSTERECTOMY    . BLADDER REPAIR    . COLONOSCOPY    . KNEE ARTHROSCOPY     rt  . PACEMAKER INSERTION  07-03-2005   [facility]AA   . PPM GENERATOR CHANGEOUT N/A 04/23/2016   Procedure: PPM Generator Changeout;  Surgeon: Evans Lance, MD;  Location:  [facility];  Service: Cardiovascular;  Laterality: N/A;  . TOTAL KNEE ARTHROPLASTY     right  . TOTAL KNEE ARTHROPLASTY Left 03/14/2015   Procedure: TOTAL KNEE ARTHROPLASTY;  Surgeon: Gaynelle Arabian, MD;  [facility];  Service: Orthopedics;  Laterality: Left;   Past Surgical History:  Procedure Laterality Date  . ABDOMINAL HYSTERECTOMY    . BLADDER REPAIR    . COLONOSCOPY    . KNEE ARTHROSCOPY     rt  . PACEMAKER INSERTION  07-03-2005   USAA   . PPM GENERATOR CHANGEOUT N/A 04/23/2016   Procedure: PPM Generator Changeout;  Surgeon: Evans Lance, MD;  Location: [facility];  Service: Cardiovascular;  Laterality: N/A;  . TOTAL KNEE ARTHROPLASTY     right  . TOTAL KNEE ARTHROPLASTY Left 03/14/2015   Procedure: TOTAL KNEE ARTHROPLASTY;  Surgeon: Gaynelle Arabian, MD;  [facility];  Service: Orthopedics;  Laterality: Left;   Social History: She is widowed. She has 3 children. Former smoker, quit smoking in 1988. She drinks 2 alcoholic beverages weekly. No drug use.   Family History: Mother with history of lung cancer. Father with history of heart disease, pacemaker. No family history of esophageal, gastric or colon cancer.  Current Outpatient Medications on File Prior to Visit  Medication Sig Dispense Refill  . amLODipine (NORVASC) 5 MG tablet Take 5 mg by mouth Daily.     Marland Kitchen aspirin EC 81  MG tablet Take 81 mg by mouth daily.    . Cholecalciferol (VITAMIN D3 PO) Take 2,000 Units by mouth daily.    Marland Kitchen levothyroxine (SYNTHROID, LEVOTHROID) 100 MCG tablet Take 100 mcg by mouth daily.    Marland Kitchen lisinopril-hydrochlorothiazide (PRINZIDE,ZESTORETIC) 20-25 MG per tablet Take 1 tablet by mouth Daily.    . pravastatin (PRAVACHOL) 40 MG tablet Take 40 mg by mouth Daily.      No current facility-administered medications on file prior to visit.   Allergies  Allergen Reactions  . Lipitor [Atorvastatin] Other (See Comments)    Muscle aches    Current Medications,  Allergies, Past Medical History, Past Surgical History, Family History and Social History were reviewed in [employer] record.   Review of Systems:   Constitutional: Negative for fever, sweats, chills or weight loss.  Respiratory: Negative for shortness of breath.   Cardiovascular: Negative for chest pain, palpitations and leg swelling.  Gastrointestinal: See HPI.  Musculoskeletal: Negative for back pain or muscle aches.  Neurological: Negative for dizziness, headaches or paresthesias.    Physical Exam: BP 108/70   Pulse 71   Ht 5\' 3"  (1.6 m)   Wt 181 lb (82.1 kg)   SpO2 98%   BMI 32.06 kg/m  General: Well developed 79 year old female in no acute distress. Head: Normocephalic and atraumatic. Eyes: No scleral icterus. Conjunctiva pink . Ears: Normal auditory acuity. Mouth: Dentition intact. No ulcers or lesions.  Lungs: Clear throughout to auscultation. Heart: Regular rate and rhythm, no murmur. Abdomen: Soft, nontender and nondistended. No masses or hepatomegaly. Normal bowel sounds x 4 quadrants. Small central lower abdominal scar intact.  Rectal: Deferred.  Musculoskeletal: Symmetrical with no gross deformities. Extremities: No edema. Neurological: Alert oriented x 4. No focal deficits.  Psychological: Alert and cooperative. Normal mood and affect  Assessment and Recommendations:  68. 79 year old female with syncope x 1 episode which preceded painless bloody diarrhea x 3 episodes most likely ischemic colitis due to hypotension. Norvasc dose reduced by PCP without recurrence of these symptoms.  -Continue to monitor BP closely, antihypertensive management per PCP and cardiologist -Patient to follow up with cardiologist prior to proceeding with a colonoscopy  -Colonoscopy benefits and risks discussed including risk with sedation, risk of bleeding, perforation and infection   -Patient to call our office if bloody diarrhea or any rectal bleeding occurs    2.History of a sessile serrated colon polyp per colonoscopy 11/20216 -See plan in # 1  3. History of CHB s/p pacemaker 2007 -see plan in # 1  ADDENDUM: Patient was seen by Barrington Ellison PA-C on 12/11/2019, patient may proceed with colonoscopy without further cardiac work up at low to moderate risk.

## 2019-11-20 ENCOUNTER — Telehealth: Payer: Self-pay | Admitting: Gastroenterology

## 2019-11-20 NOTE — Progress Notes (Signed)
Reviewed and agree with management plan.  Taia Bramlett T. Jarica Plass, MD FACG [facility]  

## 2019-11-20 NOTE — Telephone Encounter (Signed)
Per phone call with patient cardiologist is requesting a letter of clearance be sent to them prior to colonoscopy.  Patient states she is scheduled for remote device check on 12-08-2019.  At that time patient states Dr Lovena Le will decide whether or not she will need an office visit prior to colonoscopy scheduled for 01-13-2020.

## 2019-11-23 ENCOUNTER — Telehealth: Payer: Self-pay

## 2019-11-23 ENCOUNTER — Telehealth: Payer: Self-pay | Admitting: Nurse Practitioner

## 2019-11-23 NOTE — Telephone Encounter (Signed)
   Primary Cardiologist:Dr. Lovena Le  Chart reviewed as part of pre-operative protocol coverage. Noted low BP when seen by Dr. Lovena Le 09/03/19 and recommended to follow up with PCP. Patient had brief episode of syncope. Witness by family. Felt dizziness when trying to stand and pass out. Seen by PCP and dx with orthostatic hypotension. Her medications adjusted and since then she is doing well. No cardiac symptoms.   Dr. Lovena Le, does patient's PPM needs to interrogated or needs any additional work up for clearance? Please forward your response to P [facility] DIV PREOP.   Thank you

## 2019-11-23 NOTE — Telephone Encounter (Signed)
Dear Dr. Lovena Le  This patient needs to be scheduled for colonoscopy in the near future, under propofol anesthesia.  Please FAX a note of MEDICAL CLEARANCE to [phone] to ATTN:  Harrel Lemon, [place].  Please fax to advise if this patient will require an office visit or further medical work-up before clearance can be given.   Thank you,    Stevan Born, CMA    CC

## 2019-11-23 NOTE — Telephone Encounter (Signed)
Pt is requesting to switch her prep from [place] to [place] since it would be cheaper for her.

## 2019-11-23 NOTE — Telephone Encounter (Signed)
    [facility] Pre-operative Risk Assessment    HEARTCARE STAFF: - Please ensure there is not already an duplicate clearance open for this procedure. - Under Visit Info/Reason for Call, type in Other and utilize the format Clearance MM/DD/YY or Clearance TBD. Do not use dashes or single digits. - If request is for dental extraction, please clarify the # of teeth to be extracted.  Request for surgical clearance:  1. What type of surgery is being performed? COLONOSCOPY   2. When is this surgery scheduled? TBD   3. What type of clearance is required (medical clearance vs. Pharmacy clearance to hold med vs. Both)? MEDICAL  4. Are there any medications that need to be held prior to surgery and how long? ASA THOUGH THIS IS NOT NEEDING TO BE HELD PER NICOLE R. CMA WITH DR. Fuller Plan OFFICE.  5. Practice name and name of physician performing surgery? [facility]   6. What is the office phone number? [phone]   7.   What is the office fax number? [phone] ATTN: NICOLE ROUTH, CMA  8.   Anesthesia type (None, local, MAC, general) ? PROPOFOL   Julaine Hua 11/23/2019, 9:29 AM  _________________________________________________________________   (provider comments below)

## 2019-11-23 NOTE — Telephone Encounter (Signed)
I have yet to see a request come through to our office for pre op. I sent a message to requesting office asking to please send clearance request to fax # [phone].

## 2019-11-24 NOTE — Telephone Encounter (Signed)
Leanor Kail, [place]  Physician Assistant  Cardiology  Telephone Encounter  Signed  Encounter Date:  11/23/2019          Signed       Show:Clear all _0 Manual_1 Template_2 Copied  Added by: _3 Leanor Kail, PA  _4 Hover for details    Primary Cardiologist:Dr. Lovena Le  Chart reviewed as part of pre-operative protocol coverage. Noted low BP when seen by Dr. Lovena Le 09/03/19 and recommended to follow up with PCP. Patient had brief episode of syncope. Witness by family. Felt dizziness when trying to stand and pass out. Seen by PCP and dx with orthostatic hypotension. Her medications adjusted and since then she is doing well. No cardiac symptoms.   Dr. Lovena Le, does patient's PPM needs to interrogated or needs any additional work up for clearance? Please forward your response to [facility] PREOP.   Thank you           Electronically signed by Leanor Kail, [place] at 11/23/2019 10:48 AM  Telephone on 11/23/2019   Telephone on 11/23/2019     Detailed Report    Michae Kava, [place]  Certified Medical Assistant    Telephone Encounter  Signed  Encounter Date:  11/23/2019          Signed       Show:Clear all _5 Manual_6 Template_7 Copied  Added by: _8 Michae Kava, CMA  _9 Hover for details    [facility] Pre-operative Risk Assessment    HEARTCARE STAFF: - Please ensure there is not already an duplicate clearance open for this procedure. - Under Visit Info/Reason for Call, type in Other and utilize the format Clearance MM/DD/YY or Clearance TBD. Do not use dashes or single digits. - If request is for dental extraction, please clarify the # of teeth to be extracted.  Request for surgical clearance:  1. What type of surgery is being performed? COLONOSCOPY   2. When is this surgery scheduled? TBD   3. What type of clearance is required (medical clearance vs. Pharmacy clearance to hold med vs. Both)?  MEDICAL  4. Are there any medications that need to be held prior to surgery and how long? ASA THOUGH THIS IS NOT NEEDING TO BE HELD PER NICOLE R. CMA WITH DR. Fuller Plan OFFICE.  5. Practice name and name of physician performing surgery? [facility]   6. What is the office phone number? [phone]   7.   What is the office fax number? [phone] ATTN: NICOLE ROUTH, CMA  8.   Anesthesia type (None, local, MAC, general) ? PROPOFOL   Julaine Hua 11/23/2019, 9:29 AM  _________________________________________________________________   (provider comments below)                      Electronically signed by Michae Kava, CMA at 11/23/2019 9:38 AM  Telephone on 11/23/2019   Telephone on 11/23/2019     Detailed Report

## 2019-11-26 NOTE — Telephone Encounter (Signed)
Patient's device was interrogated on 09/03/19 and scheduled remote received 09/08/19 that showed device function WNL and no events related to syncopal episode or of concern. Dr Lovena Le reviewed both transmissions and no changes were needed to programming or patient's treatment plan concerning the pacemaker.

## 2019-11-26 NOTE — Telephone Encounter (Signed)
I will send this to device clinic in regards to PPM. Please see note from [place] on clearance.

## 2019-11-27 MED ORDER — PEG 3350-KCL-NA BICARB-NACL 420 G PO SOLR: 4000.0000 mL | Freq: Once | ORAL | 0 refills | Status: AC

## 2019-11-27 NOTE — Telephone Encounter (Signed)
Patient notified that new prep was sent to pharmacy. Will send patient new set of instructions once it has been verified that pharmacy does have prep.  Patient agreed to plan and verbalized understanding.

## 2019-11-27 NOTE — Addendum Note (Signed)
Addended by: Stevan Born on: 11/27/2019 11:40 AM   Modules accepted: Orders

## 2019-11-27 NOTE — Telephone Encounter (Signed)
No additional interrogation. I would recommend keeping a log of the bp and bring to followup appointment

## 2019-11-30 NOTE — Telephone Encounter (Signed)
Tried to call patient back to update her on Dr. Tanna Furry recommendations. Left message on home and mobile numbers to call back and ask to speak with pre-op team.  Darreld Mclean, PA-C 11/30/2019 11:46 AM

## 2019-12-07 NOTE — Telephone Encounter (Signed)
Pt has been made aware that she will need a office-visit for her upcoming Colonscopy. She understands to keep a bp log and bring it with her to that appt. Pt aware that Richardson, EP scheduler will call her to set up an appointment.  She was very grateful for the call.  Will route back to the requesting surgeon's office to make them aware.

## 2019-12-07 NOTE — Telephone Encounter (Signed)
Needs to keep logs of BP and needs follow up appt with Amber or Renee or Jonni Sanger to see if stable for surgery -due to syncope

## 2019-12-08 ENCOUNTER — Ambulatory Visit (INDEPENDENT_AMBULATORY_CARE_PROVIDER_SITE_OTHER): Payer: PPO

## 2019-12-08 DIAGNOSIS — I442 Atrioventricular block, complete: Secondary | ICD-10-CM

## 2019-12-08 LAB — CUP PACEART REMOTE DEVICE CHECK
Battery Remaining Longevity: 138 mo
Battery Remaining Percentage: 100 %
Brady Statistic RA Percent Paced: 14 %
Brady Statistic RV Percent Paced: 100 %
Date Time Interrogation Session: 20211130050100
Implantable Lead Implant Date: 20070626
Implantable Lead Implant Date: 20070626
Implantable Lead Location: 753859
Implantable Lead Location: 753860
Implantable Lead Model: 4456
Implantable Lead Model: 4469
Implantable Lead Serial Number: 452570
Implantable Lead Serial Number: 480762
Implantable Pulse Generator Implant Date: 20180416
Lead Channel Impedance Value: 346 Ohm
Lead Channel Impedance Value: 446 Ohm
Lead Channel Pacing Threshold Amplitude: 0.5 V
Lead Channel Pacing Threshold Amplitude: 1.1 V
Lead Channel Pacing Threshold Pulse Width: 0.4 ms
Lead Channel Pacing Threshold Pulse Width: 0.4 ms
Lead Channel Setting Pacing Amplitude: 1.5 V
Lead Channel Setting Pacing Amplitude: 2 V
Lead Channel Setting Pacing Pulse Width: 0.4 ms
Lead Channel Setting Sensing Sensitivity: 2.5 mV
Pulse Gen Serial Number: 782681

## 2019-12-11 ENCOUNTER — Other Ambulatory Visit: Payer: Self-pay

## 2019-12-11 ENCOUNTER — Ambulatory Visit: Payer: PPO | Admitting: Student

## 2019-12-11 ENCOUNTER — Encounter: Payer: Self-pay | Admitting: Student

## 2019-12-11 VITALS — BP 110/70 | Wt 180.0 lb

## 2019-12-11 DIAGNOSIS — I442 Atrioventricular block, complete: Secondary | ICD-10-CM | POA: Diagnosis not present

## 2019-12-11 DIAGNOSIS — Z950 Presence of cardiac pacemaker: Secondary | ICD-10-CM | POA: Diagnosis not present

## 2019-12-11 DIAGNOSIS — I10 Essential (primary) hypertension: Secondary | ICD-10-CM | POA: Diagnosis not present

## 2019-12-11 LAB — CUP PACEART INCLINIC DEVICE CHECK
Date Time Interrogation Session: 20211203102411
Implantable Lead Implant Date: 20070626
Implantable Lead Implant Date: 20070626
Implantable Lead Location: 753859
Implantable Lead Location: 753860
Implantable Lead Model: 4456
Implantable Lead Model: 4469
Implantable Lead Serial Number: 452570
Implantable Lead Serial Number: 480762
Implantable Pulse Generator Implant Date: 20180416
Lead Channel Impedance Value: 372 Ohm
Lead Channel Impedance Value: 464 Ohm
Lead Channel Pacing Threshold Amplitude: 0.5 V
Lead Channel Pacing Threshold Pulse Width: 0.4 ms
Lead Channel Sensing Intrinsic Amplitude: 5.9 mV
Lead Channel Setting Pacing Amplitude: 1.6 V
Lead Channel Setting Pacing Amplitude: 2 V
Lead Channel Setting Pacing Pulse Width: 0.4 ms
Lead Channel Setting Sensing Sensitivity: 2.5 mV
Pulse Gen Serial Number: 782681

## 2019-12-11 NOTE — Patient Instructions (Signed)
Medication Instructions:  Your physician recommends that you continue on your current medications as directed. Please refer to the Current Medication list given to you today.  *If you need a refill on your cardiac medications before your next appointment, please call your pharmacy*   Lab Work: None ordered   If you have labs (blood work) drawn today and your tests are completely normal, you will receive your results only by: Marland Kitchen MyChart Message (if you have MyChart) OR . A paper copy in the mail If you have any lab test that is abnormal or we need to change your treatment, we will call you to review the results.   Testing/Procedures: None ordered    Follow-Up: At [facility], you and your health needs are our priority.  As part of our continuing mission to provide you with exceptional heart care, we have created designated Provider Care Teams.  These Care Teams include your primary Cardiologist (physician) and Advanced Practice Providers (APPs -  Physician Assistants and Nurse Practitioners) who all work together to provide you with the care you need, when you need it.  We recommend signing up for the patient portal called "MyChart".  Sign up information is provided on this After Visit Summary.  MyChart is used to connect with patients for Virtual Visits (Telemedicine).  Patients are able to view lab/test results, encounter notes, upcoming appointments, etc.  Non-urgent messages can be sent to your provider as well.   To learn more about what you can do with MyChart, go to [email or web address].    Your next appointment:   1 year(s)  The format for your next appointment:   In Person  Provider:   You may see Cristopher Peru, MD or one of the following Advanced Practice Providers on your designated Care Team:    Chanetta Marshall, NP  Tommye Standard, PA-C  Legrand Como "Jonni Sanger" [place], [place]    Other Instructions None

## 2019-12-11 NOTE — Telephone Encounter (Signed)
Patient was seen today on 12-11-2019 by cardiology and cleared to proceed colonoscopy with Dr Fuller Plan on 01-13-2019.

## 2019-12-11 NOTE — Progress Notes (Addendum)
Electrophysiology Office Note Date: 12/11/2019  ID:  [name], DOB 1940/05/06, MRN [id number]  PCP: Mayra Neer, MD Primary Cardiologist: No primary care provider on file. Electrophysiologist: Cristopher Peru, MD   CC: Pacemaker follow-up  [name] is a 79 y.o. female seen today for Cristopher Peru, MD for routine electrophysiology followup.  Since last being seen in our clinic the patient reports doing very well. She has had no further syncope, near syncope, or orthostatic symptoms.  she denies chest pain, palpitations, dyspnea, PND, orthopnea, nausea, vomiting, dizziness, syncope, edema, weight gain, or early satiety.  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 Arthritis   . Atrioventricular block, complete ([place])   . Cardiac pacemaker in situ   . CHB (complete heart block) ([place]) 2007  . Essential hypertension, benign   . Frequency of urination   . History of skin cancer   . Hyperlipidemia   . Malignant melanoma ([place]) 03/12/2016   LEFT KNEE- TX WITH EXCISION  . Postmenopausal   . SCC (squamous cell carcinoma) 10/27/2014   SCC/KA-LEFT SHIN- TX CURET X 3,5FU  . Thyroid disease    hypothyroid   Past Surgical History:  Procedure Laterality Date  . ABDOMINAL HYSTERECTOMY    . BLADDER REPAIR    . COLONOSCOPY    . KNEE ARTHROSCOPY     rt  . PACEMAKER INSERTION  07-03-2005   [facility]   . PPM GENERATOR CHANGEOUT N/A 04/23/2016   Procedure: PPM Generator Changeout;  Surgeon: Evans Lance, MD;  Location: [facility];  Service: Cardiovascular;  Laterality: N/A;  . TOTAL KNEE ARTHROPLASTY     right  . TOTAL KNEE ARTHROPLASTY Left 03/14/2015   Procedure: TOTAL KNEE ARTHROPLASTY;  Surgeon: Gaynelle Arabian, MD;  Location: [facility];  Service: Orthopedics;  Laterality: Left;    Current Outpatient Medications  Medication Sig Dispense Refill  . amLODipine (NORVASC) 5 MG  tablet Take 5 mg by mouth Daily.     Marland Kitchen aspirin EC 81 MG tablet Take 81 mg by mouth daily.    . Cholecalciferol (VITAMIN D3 PO) Take 2,000 Units by mouth daily.    Marland Kitchen levothyroxine (SYNTHROID, LEVOTHROID) 100 MCG tablet Take 100 mcg by mouth daily.    Marland Kitchen lisinopril-hydrochlorothiazide (PRINZIDE,ZESTORETIC) 20-25 MG per tablet Take 1 tablet by mouth Daily.    . pravastatin (PRAVACHOL) 40 MG tablet Take 40 mg by mouth Daily.      No current facility-administered medications for this visit.    Allergies:   Lipitor [atorvastatin]   Social History: Social History   Socioeconomic History  . Marital status: Widowed    Spouse name: Not on file  . Number of children: Not on file  . Years of education: Not on file  . Highest education level: Not on file  Occupational History  . Occupation: retired  Tobacco Use  . Smoking status: Former Smoker    Quit date: 03/06/1986    Years since quitting: 33.7  . Smokeless tobacco: Never Used  Substance and Sexual Activity  . Alcohol use: Yes    Comment: wine rarely  . Drug use: No  . Sexual activity: Not on file  Other Topics Concern  . Not on file  Social History Narrative  . Not on file   Social Determinants of Health   Financial Resource Strain:   . Difficulty of Paying Living Expenses: Not on file  Food  Insecurity:   . Worried About Charity fundraiser in the Last Year: Not on file  . Ran Out of Food in the Last Year: Not on file  Transportation Needs:   . Lack of Transportation (Medical): Not on file  . Lack of Transportation (Non-Medical): Not on file  Physical Activity:   . Days of Exercise per Week: Not on file  . Minutes of Exercise per Session: Not on file  Stress:   . Feeling of Stress : Not on file  Social Connections:   . Frequency of Communication with Friends and Family: Not on file  . Frequency of Social Gatherings with Friends and Family: Not on file  . Attends Religious Services: Not on file  . Active Member of Clubs  or Organizations: Not on file  . Attends Archivist Meetings: Not on file  . Marital Status: Not on file  Intimate Partner Violence:   . Fear of Current or Ex-Partner: Not on file  . Emotionally Abused: Not on file  . Physically Abused: Not on file  . Sexually Abused: Not on file    Family History: Family History  Problem Relation Age of Onset  . Lung cancer Mother   . Heart attack Father        pacemaker & defibrillator  . Colon cancer Neg Hx   . Esophageal cancer Neg Hx   . Stomach cancer Neg Hx   . Rectal cancer Neg Hx      Review of Systems: All other systems reviewed and are otherwise negative except as noted above.  Physical Exam: Vitals:   12/11/19 0916  BP: 110/70  Weight: 180 lb (81.6 kg)     GEN- The patient is well appearing, alert and oriented x 3 today.   HEENT: normocephalic, atraumatic; sclera clear, conjunctiva pink; hearing intact; oropharynx clear; neck supple  Lungs- Clear to ausculation bilaterally, normal work of breathing.  No wheezes, rales, rhonchi Heart- Regular rate and rhythm, no murmurs, rubs or gallops  GI- soft, non-tender, non-distended, bowel sounds present  Extremities- no clubbing or cyanosis. No edema MS- no significant deformity or atrophy Skin- warm and dry, no rash or lesion; PPM pocket well healed Psych- euthymic mood, full affect Neuro- strength and sensation are intact  PPM Interrogation- reviewed in detail today,  See PACEART report  EKG:  EKG is not ordered today. The ekg ordered 08/2019 shows V pacing at 80 bpm  Recent Labs: No results found for requested labs within last 8760 hours.   Wt Readings from Last 3 Encounters:  12/11/19 180 lb (81.6 kg)  11/19/19 181 lb (82.1 kg)  09/03/19 183 lb (83 kg)     Other studies Reviewed: Additional studies/ records that were reviewed today include: Previous EP office notes, Previous remote checks, Most recent labwork.   Assessment and Plan:  1. CHB s/p [place]  Scientific PPM  Normal PPM function See Claudia Desanctis Art report No changes today  2. HTN Log brought in today shows stable BP She has had no further orthostatic symptoms.   3. Obesity Body mass index is 31.89 kg/m.   4. Cardiac Clearance for Colonoscopy Pt may proceed with colonoscopy without further cardiac work up at low to moderate risk.  She has had recent issues with orthostasis, so needs to carefully hydrate and take great care with positional changes / transitions from lying to sitting to standing, especially immediately post colonoscopy and during prep.   Current medicines are reviewed at length with the  patient today.   The patient does not have concerns regarding her medicines.  The following changes were made today:  none  Labs/ tests ordered today include:  No orders of the defined types were placed in this encounter.  Disposition:   Follow up with Dr. Lovena Le in 12 Months   Signed, Annamaria Helling  12/11/2019 9:43 AM  [facility] [address] [place] [address] [phone] (office) [phone] (fax)

## 2019-12-14 NOTE — Progress Notes (Signed)
Remote pacemaker transmission.   

## 2020-01-12 ENCOUNTER — Telehealth: Payer: Self-pay

## 2020-01-12 NOTE — Telephone Encounter (Signed)
Patient called to office to verify instructions for colonoscopy scheduled for 01-13-2020.  Instructions discussed in detail with patient.  Patient agreed to plan and verbalized understanding. No further questions.                                             NAME: [name]   DOB: Mar 02, 1940 MRN: [id number] Procedure Date: 01-13-2020 ARRIVAL TIME : 8:30am Procedure Time : 9:30am   Procedure Location: [facility] (4th Floor)    [place], [place] [address]      -[facility] Doors open at 645 am  PREPARATION FOR COLONOSCOPY WITH GOLYTELY (PEG-electrolytes) PREP AND DULCOLAX TABLETS  THIS IS A SPLIT-DOSE PREP YOU WILL NOT BE DRINKING ALL THE PREP AT SAME TIME   Please pick up your prep from the pharmacy with 2-3 days after getting your prep instructions.  In addition to purchasing Golytely prescription from your pharmacist, please purchase the following item over the counter:  One box of Dulcolax 5 mg tablets (you will need 4 tablets)  FOLLOW THESE INSTRUCTIONS, NOT INSTRUCTIONS ON THE PREP   Starting 5 days before your procedure (01-08-20): Do not eat nuts, seeds, popcorn, corn, beans, peas, salads, or any raw vegetables.  Do not take any fiber supplements (e.g. Metamucil, Citrucel, and Benefiber)   __________________________________________________________   THE DAY BEFORE YOUR PROCEDURE          DATE: 01-12-2020   DAY: Tuesday  1. In the morning, MIX THE GOLYTELY SOLUTION.  [Add lukewarm drinking water to top of line on bottle, add flavor packet of choice if included with prep solution.  Cap the bottle and shake well to dissolve the powder.  Place solution in the refrigerator. Use within 48 hours.]   2. Drink clear liquids the entire day - YOU SHOULD NOT EAT ANY SOLID FOOD.   3. Do not drink anything colored red or purple. Avoid juices with pulp. No orange juice.    4. Drink at least 64 oz. (8 glasses) of fluid/clear liquids during the day to  prevent dehydration and help the prep work best.  5. Drink one 8-10 oz glass of any clear liquid once an hour.    Clear liquids include:  NO RED & NO PURPLE  Water Jello  Ice Popsicles  Tea (sugar ok, no milk/cream) Powdered fruit flavored drinks  Coffee (sugar ok, no milk/cream) Gatorade  Juice: apple, white grape, white cranberry Lemonade, Kool-Aid  Clear bullion,broth (vegetable,chicken,beef) Carbonated beverages (any kind)  Strained chicken noodle soup  (no noodles or chicken) Hard Candy   6.   Take 4 Dulcolax tablets at 3 pm with water.   7.   Begin drinking the prep at 5 PM.  Drink 8 ounces of the Golytely prep every 15 minutes until half of the solution (64 ounces) is finished- 8 glasses .  Return unfinished solution to refrigerator.  8.   Follow the solution with 16 oz. of the clear liquid of your choice (nothing red or purple).  Continue to drink clear liquids until bedtime.  __________________________________________________________  THE DAY OF YOUR PROCEDURE       DATE: 01-13-2020   DAY: Wednesday  STAY ON CLEAR LIQUIDS-NO SOLID FOODS!   1. Beginning at 3:30am (6 hours before scheduled procedure).  Drink 8 ounces of Golytely prep every 15 minutes until the remaining half (  64 ounces) of the solution is finished- 8 glasses .    2. Follow the completed prep with 16 oz. of the clear liquid of your choice.  3. You may drink additional clear liquids until 6:30am  (3 HOURS BEFORE PROCEDURE).  STOP DRINKING ALL LIQUIDS, NO WATER,CLEAR LIQUIDS,HARD CANDY,GUM,MEDICATIONS ETC... 3 HOUR PRIOR TO YOUR PROCEDURE! __________________________________________________________ MEDICATION INSTRUCTIONS  Unless otherwise instructed, you should take regular prescription medications with a small sip of water as early as possible the morning of your procedure.  Take allowed medicines by 6:30am   Additional medication instructions: If you use any type of inhaler please bring them with you  to your procedure.   Call office [phone]) if you have fever 2 days before procedure

## 2020-01-13 ENCOUNTER — Other Ambulatory Visit: Payer: Self-pay

## 2020-01-13 ENCOUNTER — Ambulatory Visit (AMBULATORY_SURGERY_CENTER): Payer: PPO | Admitting: Gastroenterology

## 2020-01-13 ENCOUNTER — Encounter: Payer: Self-pay | Admitting: Gastroenterology

## 2020-01-13 VITALS — BP 118/63 | HR 65 | Temp 97.3°F | Resp 16 | Ht 63.0 in | Wt 181.0 lb

## 2020-01-13 DIAGNOSIS — Z860100 Personal history of colon polyps, unspecified: Secondary | ICD-10-CM

## 2020-01-13 DIAGNOSIS — D124 Benign neoplasm of descending colon: Secondary | ICD-10-CM

## 2020-01-13 DIAGNOSIS — D123 Benign neoplasm of transverse colon: Secondary | ICD-10-CM

## 2020-01-13 DIAGNOSIS — Z1211 Encounter for screening for malignant neoplasm of colon: Secondary | ICD-10-CM | POA: Diagnosis not present

## 2020-01-13 DIAGNOSIS — D122 Benign neoplasm of ascending colon: Secondary | ICD-10-CM | POA: Diagnosis not present

## 2020-01-13 DIAGNOSIS — I10 Essential (primary) hypertension: Secondary | ICD-10-CM | POA: Diagnosis not present

## 2020-01-13 DIAGNOSIS — D120 Benign neoplasm of cecum: Secondary | ICD-10-CM | POA: Diagnosis not present

## 2020-01-13 DIAGNOSIS — Z950 Presence of cardiac pacemaker: Secondary | ICD-10-CM | POA: Diagnosis not present

## 2020-01-13 MED ORDER — SODIUM CHLORIDE 0.9 % IV SOLN: 500.0000 mL | Freq: Once | INTRAVENOUS | Status: DC

## 2020-01-13 NOTE — Patient Instructions (Addendum)
Thank you for allowing [place] to care for you today!  Await pathology results of polyps removed, approximately 7-10 days by mail.  Resume previous diet and medications today.  Return to your normal activities tomorrow.  No further routine colonoscopies recommended based on current age guidelines.  Please contact Dr Ardell Isaacs office for any issues or concerns.  Recommend following high fiber diet.  Avoid Aspirin, Ibuprofen, Naproxen or other non-steroidal anti-inflammatory medications for the next 2 weeks.  May use Tylenol for pain or fever.   YOU HAD AN ENDOSCOPIC PROCEDURE TODAY AT THE [facility]:   Refer to the procedure report that was given to you for any specific questions about what was found during the examination.  If the procedure report does not answer your questions, please call your gastroenterologist to clarify.  If you requested that your care partner not be given the details of your procedure findings, then the procedure report has been included in a sealed envelope for you to review at your convenience later.  YOU SHOULD EXPECT: Some feelings of bloating in the abdomen. Passage of more gas than usual.  Walking can help get rid of the air that was put into your GI tract during the procedure and reduce the bloating. If you had a lower endoscopy (such as a colonoscopy or flexible sigmoidoscopy) you may notice spotting of blood in your stool or on the toilet paper. If you underwent a bowel prep for your procedure, you may not have a normal bowel movement for a few days.  Please Note:  You might notice some irritation and congestion in your nose or some drainage.  This is from the oxygen used during your procedure.  There is no need for concern and it should clear up in a day or so.  SYMPTOMS TO REPORT IMMEDIATELY:   Following lower endoscopy (colonoscopy or flexible sigmoidoscopy):  Excessive amounts of blood in the stool  Significant tenderness or worsening of abdominal  pains  Swelling of the abdomen that is new, acute  Fever of 100F or higher   For urgent or emergent issues, a gastroenterologist can be reached at any hour by calling [phone]. Do not use MyChart messaging for urgent concerns.    DIET:  We do recommend a small meal at first, but then you may proceed to your regular diet.  Drink plenty of fluids but you should avoid alcoholic beverages for 24 hours.  ACTIVITY:  You should plan to take it easy for the rest of today and you should NOT DRIVE or use heavy machinery until tomorrow (because of the sedation medicines used during the test).    FOLLOW UP: Our staff will call the number listed on your records 48-72 hours following your procedure to check on you and address any questions or concerns that you may have regarding the information given to you following your procedure. If we do not reach you, we will leave a message.  We will attempt to reach you two times.  During this call, we will ask if you have developed any symptoms of COVID 19. If you develop any symptoms (ie: fever, flu-like symptoms, shortness of breath, cough etc.) before then, please call [phone].  If you test positive for Covid 19 in the 2 weeks post procedure, please call and report this information to [place].    If any biopsies were taken you will be contacted by phone or by letter within the next 1-3 weeks.  Please call us at [phone]  if you have not heard about the biopsies in 3 weeks.    SIGNATURES/CONFIDENTIALITY: You and/or your care partner have signed paperwork which will be entered into your electronic medical record.  These signatures attest to the fact that that the information above on your After Visit Summary has been reviewed and is understood.  Full responsibility of the confidentiality of this discharge information lies with you and/or your care-partner.

## 2020-01-13 NOTE — Op Note (Signed)
[facility] Patient Name: [name] Procedure Date: 01/13/2020 9:07 AM MRN: [id number] Endoscopist: Ladene Artist , MD Age: 80 Referring MD:  Date of Birth: April 09, 1940 Gender: Female Account #: [id number] Procedure:                Colonoscopy Indications:              High risk colon cancer surveillance: Personal                            history of sessile serrated colon polyp (less than                            10 mm in size) with no dysplasia Medicines:                Monitored Anesthesia Care Procedure:                Pre-Anesthesia Assessment:                           - Prior to the procedure, a History and Physical                            was performed, and patient medications and                            allergies were reviewed. The patient's tolerance of                            previous anesthesia was also reviewed. The risks                            and benefits of the procedure and the sedation                            options and risks were discussed with the patient.                            All questions were answered, and informed consent                            was obtained. Prior Anticoagulants: The patient has                            taken no previous anticoagulant or antiplatelet                            agents. ASA Grade Assessment: II - A patient with                            mild systemic disease. After reviewing the risks                            and benefits, the patient was deemed in  satisfactory condition to undergo the procedure.                           After obtaining informed consent, the colonoscope                            was passed under direct vision. Throughout the                            procedure, the patient's blood pressure, pulse, and                            oxygen saturations were monitored continuously. The                            Olympus CF-[id number])  Colonoscope was                            introduced through the anus and advanced to the the                            cecum, identified by appendiceal orifice and                            ileocecal valve. The ileocecal valve, appendiceal                            orifice, and rectum were photographed. The quality                            of the bowel preparation was good. The colonoscopy                            was performed without difficulty. The patient                            tolerated the procedure well. Scope In: 9:21:47 AM Scope Out: 9:41:47 AM Scope Withdrawal Time: 0 hours 14 minutes 2 seconds  Total Procedure Duration: 0 hours 20 minutes 0 seconds  Findings:                 The perianal and digital rectal examinations were                            normal.                           Six sessile polyps were found in the descending                            colon (2), transverse colon (1), ascending colon                            (2) and cecum (1). The polyps were 5 to 9 mm in  size. These polyps were removed with a cold snare.                            Resection and retrieval were complete.                           Multiple small-mouthed diverticula were found in                            the left colon.                           Internal hemorrhoids were found during                            retroflexion. The hemorrhoids were small and Grade                            I (internal hemorrhoids that do not prolapse).                           The exam was otherwise without abnormality on                            direct and retroflexion views. Complications:            No immediate complications. Estimated blood loss:                            None. Estimated Blood Loss:     Estimated blood loss: none. Impression:               - Six 5 to 9 mm polyps in the descending colon, in                            the transverse colon, in  the ascending colon and in                            the cecum, removed with a cold snare. Resected and                            retrieved.                           - Mild diverticulosis in the left colon.                           - Internal hemorrhoids.                           - The examination was otherwise normal on direct                            and retroflexion views. Recommendation:           - Patient has a contact number available for  emergencies. The signs and symptoms of potential                            delayed complications were discussed with the                            patient. Return to normal activities tomorrow.                            Written discharge instructions were provided to the                            patient.                           - High fiber diet.                           - Continue present medications.                           - Await pathology results.                           - No repeat colonoscopy due to age.                           - No aspirin, ibuprofen, naproxen, or other                            non-steroidal anti-inflammatory drugs for 2 weeks                            after polyp removal. Ladene Artist, MD 01/13/2020 9:47:04 AM This report has been signed electronically.

## 2020-01-13 NOTE — Progress Notes (Signed)
Called to room to assist during endoscopic procedure.  Patient ID and intended procedure confirmed with present staff. Received instructions for my participation in the procedure from the performing physician.  

## 2020-01-13 NOTE — Progress Notes (Signed)
Report to PACU, RN, vss, BBS= Clear.  

## 2020-01-15 ENCOUNTER — Telehealth: Payer: Self-pay

## 2020-01-15 NOTE — Telephone Encounter (Signed)
  Follow up Call-  Call back number 01/13/2020  Post procedure Call Back phone  # [phone]  Permission to leave phone message Yes  Some recent data might be hidden     Patient questions:  Do you have a fever, pain , or abdominal swelling? No. Pain Score  0 *  Have you tolerated food without any problems? Yes.    Have you been able to return to your normal activities? Yes.    Do you have any questions about your discharge instructions: Diet   No. Medications  No. Follow up visit  No.  Do you have questions or concerns about your Care? No.  Actions: * If pain score is 4 or above: No action needed, pain <4.  Have you developed a fever since your procedure? No 2.   Have you had an respiratory symptoms (SOB or cough) since your procedure? No 3.   Have you tested positive for COVID 19 since your procedure No 4.   Have you had any family members/close contacts diagnosed with the COVID 19 since your procedure? No  If yes to any of these questions please route to Joylene John, RN and Joella Prince, RN

## 2020-01-15 NOTE — Telephone Encounter (Signed)
Left message on follow up call. 

## 2020-01-21 ENCOUNTER — Encounter: Payer: Self-pay | Admitting: Gastroenterology

## 2020-03-08 ENCOUNTER — Ambulatory Visit (INDEPENDENT_AMBULATORY_CARE_PROVIDER_SITE_OTHER): Payer: PPO

## 2020-03-08 DIAGNOSIS — I442 Atrioventricular block, complete: Secondary | ICD-10-CM | POA: Diagnosis not present

## 2020-03-08 LAB — CUP PACEART REMOTE DEVICE CHECK
Battery Remaining Longevity: 132 mo
Battery Remaining Percentage: 100 %
Brady Statistic RA Percent Paced: 8 %
Brady Statistic RV Percent Paced: 100 %
Date Time Interrogation Session: 20220301051400
Implantable Lead Implant Date: 20070626
Implantable Lead Implant Date: 20070626
Implantable Lead Location: 753859
Implantable Lead Location: 753860
Implantable Lead Model: 4456
Implantable Lead Model: 4469
Implantable Lead Serial Number: 452570
Implantable Lead Serial Number: 480762
Implantable Pulse Generator Implant Date: 20180416
Lead Channel Impedance Value: 352 Ohm
Lead Channel Impedance Value: 443 Ohm
Lead Channel Pacing Threshold Amplitude: 0.4 V
Lead Channel Pacing Threshold Amplitude: 0.8 V
Lead Channel Pacing Threshold Pulse Width: 0.4 ms
Lead Channel Pacing Threshold Pulse Width: 0.4 ms
Lead Channel Setting Pacing Amplitude: 1.4 V
Lead Channel Setting Pacing Amplitude: 2 V
Lead Channel Setting Pacing Pulse Width: 0.4 ms
Lead Channel Setting Sensing Sensitivity: 2.5 mV
Pulse Gen Serial Number: 782681

## 2020-03-16 NOTE — Progress Notes (Signed)
Remote pacemaker transmission.   

## 2020-03-28 DIAGNOSIS — Z1231 Encounter for screening mammogram for malignant neoplasm of breast: Secondary | ICD-10-CM | POA: Diagnosis not present

## 2020-04-07 DIAGNOSIS — R922 Inconclusive mammogram: Secondary | ICD-10-CM | POA: Diagnosis not present

## 2020-04-07 DIAGNOSIS — R928 Other abnormal and inconclusive findings on diagnostic imaging of breast: Secondary | ICD-10-CM | POA: Diagnosis not present

## 2020-04-08 DIAGNOSIS — C50919 Malignant neoplasm of unspecified site of unspecified female breast: Secondary | ICD-10-CM

## 2020-04-08 HISTORY — DX: Malignant neoplasm of unspecified site of unspecified female breast: C50.919

## 2020-04-20 ENCOUNTER — Other Ambulatory Visit: Payer: Self-pay

## 2020-04-20 DIAGNOSIS — C50511 Malignant neoplasm of lower-outer quadrant of right female breast: Secondary | ICD-10-CM | POA: Diagnosis not present

## 2020-04-22 ENCOUNTER — Encounter: Payer: Self-pay | Admitting: *Deleted

## 2020-04-22 ENCOUNTER — Encounter: Payer: Self-pay | Admitting: Family Medicine

## 2020-04-22 DIAGNOSIS — C50511 Malignant neoplasm of lower-outer quadrant of right female breast: Secondary | ICD-10-CM | POA: Insufficient documentation

## 2020-04-26 ENCOUNTER — Telehealth: Payer: Self-pay | Admitting: Oncology

## 2020-04-26 NOTE — Progress Notes (Signed)
[place]  Telephone:([phone]) [phone] Fax:([phone]     ID: Samantha [name] DOB: 05/28/1940  MR#: [id number]  OZY#:[id number]  Patient Care Team: Mayra Neer, MD as PCP - General (Family Medicine) Evans Lance, MD as PCP - Electrophysiology (Cardiology) Rockwell Germany, RN as Oncology Nurse Navigator Mauro Kaufmann, RN as Oncology Nurse Navigator Jovita Kussmaul, MD as Consulting Physician (General Surgery) Javona Bergevin, Virgie Dad, MD as Consulting Physician (Oncology) Eppie Gibson, MD as Attending Physician (Radiation Oncology) Ladene Artist, MD as Consulting Physician (Gastroenterology) Chauncey Cruel, MD OTHER MD:  CHIEF COMPLAINT: Estrogen receptor positive breast cancer  CURRENT TREATMENT: Awaiting definitive surgery   HISTORY OF CURRENT ILLNESS: Samantha [name] had routine screening mammography on 03/28/2020 showing a possible abnormality in the right breast. She underwent right diagnostic mammography with tomography and right breast ultrasonography at [facility] on 04/07/2020 showing: breast density category A; 0.5 cm round mass in right breast at 8 o'clock; no abnormal nodes in right axilla.  Accordingly on 04/20/2020 she proceeded to biopsy of the right breast area in question. The pathology from this procedure ([id number]) showed: invasive ductal carcinoma, grade 2; ductal carcinoma in situ. Prognostic indicators significant for: estrogen receptor, >95% positive and progesterone receptor, >95% positive, both with strong staining intensity. Proliferation marker Ki67 at 10%. HER2 equivocal by immunohistochemistry (2+), but negative by fluorescent in situ hybridization with a signals ratio 1.32 and number per cell 1.85.  Cancer Staging Malignant neoplasm of lower-outer quadrant of right breast of female, estrogen receptor positive ([name]) Staging form: Breast, AJCC 8th Edition - Clinical stage from 04/27/2020: Stage IA (cT1a, cN0, cM0, G2, ER+, PR+,  HER2-) - Signed by Chauncey Cruel, MD on 04/27/2020 Stage prefix: Initial diagnosis Histologic grading system: 3 grade system   The patientScott subsequent history is as detailed below.   INTERVAL HISTORY: Samantha [name] was evaluated in the multidisciplinary breast cancer clinic on 04/27/2020 accompanied by her daughter Samantha [name]. Her case was also presented at the multidisciplinary breast cancer conference on the same day. At that time a preliminary plan was proposed: Breast conserving surgery, with no sentinel lymph node sampling, antiestrogens, likely no adjuvant radiation, genetics testing   REVIEW OF SYSTEMS: There were no specific symptoms leading to the original mammogram, which was routinely scheduled. On the provided questionnaire, Samantha [name] reports wearing glasses, having a pacemaker in place, and thyroid problem. The patient denies unusual headaches, visual changes, nausea, vomiting, stiff neck, dizziness, or gait imbalance. There has been no cough, phlegm production, or pleurisy, no chest pain or pressure, and no change in bowel or bladder habits. The patient denies fever, rash, bleeding, unexplained fatigue or unexplained weight loss. A detailed review of systems was otherwise entirely negative.   COVID 19 VACCINATION STATUS: Moderna x2, no booster as of 04/27/2020   PAST MEDICAL HISTORY: Past Medical History:  Diagnosis Date  . Arthritis   . Atrioventricular block, complete ([place])   . Breast cancer ([place])   . Cardiac pacemaker in situ   . CHB (complete heart block) ([place]) 2007  . Essential hypertension, benign   . Frequency of urination   . History of skin cancer   . Hyperlipidemia   . Malignant melanoma ([place]) 03/12/2016   LEFT KNEE- TX WITH EXCISION  . Postmenopausal   . SCC (squamous cell carcinoma) 10/27/2014   SCC/KA-LEFT SHIN- TX CURET X 3,5FU  . Thyroid disease    hypothyroid    PAST SURGICAL HISTORY: Past Surgical History:  Procedure Laterality  Date  . ABDOMINAL  HYSTERECTOMY    . BLADDER REPAIR    . COLONOSCOPY    . KNEE ARTHROSCOPY     rt  . PACEMAKER INSERTION  07-03-2005   USAA   . PPM GENERATOR CHANGEOUT N/A 04/23/2016   Procedure: PPM Generator Changeout;  Surgeon: Evans Lance, MD;  Location: [facility];  Service: Cardiovascular;  Laterality: N/A;  . TOTAL KNEE ARTHROPLASTY     right  . TOTAL KNEE ARTHROPLASTY Left 03/14/2015   Procedure: TOTAL KNEE ARTHROPLASTY;  Surgeon: Gaynelle Arabian, MD;  Location: [facility];  Service: Orthopedics;  Laterality: Left;    FAMILY HISTORY: Family History  Problem Relation Age of Onset  . Lung cancer Mother   . Heart attack Father        pacemaker & defibrillator  . Colon cancer Neg Hx   . Esophageal cancer Neg Hx   . Stomach cancer Neg Hx   . Rectal cancer Neg Hx    Her father died at age 17 from [place]. Her mother died at age 60 from lung cancer, diagnosed at age 22. She was a smoker. Samantha [name] has 3 brothers and 4 sisters. Aside from her mother, she reports breast cancer in a sister at age 89 and prostate cancer in a brother.   GYNECOLOGIC HISTORY:  No LMP recorded. Patient has had a hysterectomy. Menarche: 80 years old Age at first live birth: 80 years old [place] P 3 LMP age 89, with hysterectomy HRT took for 10 years hollowing hysterectomy  Hysterectomy? yes BSO? Only one ovary taken   SOCIAL HISTORY: (updated 04/2020)  Samantha [name] is currently retired from working as a Scientist, clinical (histocompatibility and immunogenetics) at [employer]. She is widowed. She lives at home by herself. Son Samantha [name], age 24, works in Architect in [place], [place]. Son Samantha [name], age 68, owns a landscape business here in [place]. Daughter Samantha [name], age 75, if a Government social research officer here in [place]. Samantha [name] has 9 grandchildren (no "greats"). She attends [employer]    ADVANCED DIRECTIVES: in place, son Samantha [name] is her HCPOA and can be reached at [phone]   HEALTH MAINTENANCE: Social History   Tobacco Use  . Smoking status: Former  Smoker    Quit date: 03/06/1986    Years since quitting: 34.1  . Smokeless tobacco: Never Used  Substance Use Topics  . Alcohol use: Yes    Comment: wine rarely  . Drug use: No     Colonoscopy: 01/2020 (Dr. Fuller Plan), repeat not indicated due to age  PAP: date unknown  Bone density: none on file   Allergies  Allergen Reactions  . Lipitor [Atorvastatin] Other (See Comments)    Muscle aches    Current Outpatient Medications  Medication Sig Dispense Refill  . amLODipine (NORVASC) 5 MG tablet Take 5 mg by mouth Daily.     Marland Kitchen aspirin EC 81 MG tablet Take 81 mg by mouth daily.    . Cholecalciferol (VITAMIN D3 PO) Take 2,000 Units by mouth daily.    Marland Kitchen levothyroxine (SYNTHROID, LEVOTHROID) 100 MCG tablet Take 100 mcg by mouth daily.    Marland Kitchen lisinopril-hydrochlorothiazide (PRINZIDE,ZESTORETIC) 20-25 MG per tablet Take 1 tablet by mouth Daily.    . pravastatin (PRAVACHOL) 40 MG tablet Take 40 mg by mouth Daily.      No current facility-administered medications for this visit.    OBJECTIVE: White woman in no acute distress  Vitals:   04/27/20 1310  BP: 131/78  Pulse: 92  Resp: 20  Temp:  97.7 F (36.5 C)  SpO2: 100%     Body mass index is 32.47 kg/m.   Wt Readings from Last 3 Encounters:  04/27/20 183 lb 4.8 oz (83.1 kg)  01/13/20 181 lb (82.1 kg)  12/11/19 180 lb (81.6 kg)      ECOG FS:1 - Symptomatic but completely ambulatory  Ocular: Sclerae unicteric, pupils round and equal Ear-nose-throat: Wearing a mask Lymphatic: No cervical or supraclavicular adenopathy Lungs no rales or rhonchi Heart regular rate and rhythm Abd soft, nontender, positive bowel sounds MSK no focal spinal tenderness, no joint edema Neuro: non-focal, well-oriented, appropriate affect Breasts: The right breast is status post recent biopsy.  There is a mild ecchymosis.  The left breast is benign.  Both axillae are benign   LAB RESULTS:  CMP     Component Value Date/Time   NA 139 04/27/2020 1210   NA  140 04/18/2016 1035   K 3.7 04/27/2020 1210   CL 102 04/27/2020 1210   CO2 26 04/27/2020 1210   GLUCOSE 122 (H) 04/27/2020 1210   BUN 23 04/27/2020 1210   BUN 25 04/18/2016 1035   CREATININE 1.15 (H) 04/27/2020 1210   CALCIUM 9.9 04/27/2020 1210   PROT 7.8 04/27/2020 1210   ALBUMIN 4.2 04/27/2020 1210   AST 15 04/27/2020 1210   ALT 11 04/27/2020 1210   ALKPHOS 68 04/27/2020 1210   BILITOT 0.5 04/27/2020 1210   GFRNONAA 48 (L) 04/27/2020 1210   GFRAA 65 04/18/2016 1035    No results found for: TOTALPROTELP, ALBUMINELP, A1GS, A2GS, BETS, BETA2SER, GAMS, MSPIKE, SPEI  Lab Results  Component Value Date   WBC 7.4 04/27/2020   NEUTROABS 4.2 04/27/2020   HGB 14.0 04/27/2020   HCT 42.1 04/27/2020   MCV 89.0 04/27/2020   PLT 248 04/27/2020    No results found for: LABCA2  No components found for: XIHWTU882  No results for input(s): INR in the last 168 hours.  No results found for: LABCA2  No results found for: CMK349  No results found for: ZPH150  No results found for: VWP794  No results found for: CA2729  No components found for: HGQUANT  No results found for: CEA1 / No results found for: CEA1   No results found for: AFPTUMOR  No results found for: CHROMOGRNA  No results found for: KPAFRELGTCHN, LAMBDASER, KAPLAMBRATIO (kappa/lambda light chains)  No results found for: HGBA, HGBA2QUANT, HGBFQUANT, HGBSQUAN (Hemoglobinopathy evaluation)   No results found for: LDH  No results found for: IRON, TIBC, IRONPCTSAT (Iron and TIBC)  No results found for: FERRITIN  Urinalysis    Component Value Date/Time   COLORURINE YELLOW 03/07/2015 [place] 03/07/2015 1033   LABSPEC 1.006 03/07/2015 1033   PHURINE 7.0 03/07/2015 [place] 03/07/2015 [place] 03/07/2015 [place] 03/07/2015 [place] 03/07/2015 [place] 03/07/2015 1033   UROBILINOGEN 0.2 10/27/2010 0945    NITRITE NEGATIVE 03/07/2015 1033   LEUKOCYTESUR TRACE (A) 03/07/2015 1033     STUDIES: No results found.   ELIGIBLE FOR AVAILABLE RESEARCH PROTOCOL: no  ASSESSMENT: 80 y.o. [place] woman status post right breast lower outer quadrant biopsy 04/20/2020 for a clinical T1a N0, stage IA invasive ductal carcinoma, grade 2, estrogen and progesterone receptor positive, HER2 not amplified, with an MIB-1 of 10%.  (1) genetics testing pending  (2) definitive surgery pending  (3) likely no adjuvant radiation  (4) antiestrogen  PLAN: I  met today with Samantha [name] to review her new diagnosis. Specifically we discussed the biology of her breast cancer, its diagnosis, staging, treatment  options and prognosis. We first reviewed the fact that cancer is not one disease but more than 100 different diseases and that it is important to keep them separate-- otherwise when friends and relatives discuss their own cancer experiences with Samantha [name] confusion can result. Similarly we explained that if breast cancer spreads to the bone or liver, the patient would not have bone cancer or liver cancer, but breast cancer in the bone and breast cancer in the liver: one cancer in three places-- not 3 different cancers which otherwise would have to be treated in 3 different ways.  We discussed the difference between local and systemic therapy. In terms of loco-regional treatment, lumpectomy plus radiation is equivalent to mastectomy as far as survival is concerned. For this reason, and because the cosmetic results are generally superior, we recommend breast conserving surgery.   We then discussed the rationale for systemic therapy. There is some risk that this cancer may have already spread to other parts of her body. Patients frequently ask at this point about bone scans, CAT scans and PET scans to find out if they have occult breast cancer somewhere else. The problem is that in early stage disease we are much more likely to  find false positives then true cancers and this would expose the patient to unnecessary procedures as well as unnecessary radiation. Scans cannot answer the question the patient really would like to know, which is whether she has microscopic disease elsewhere in her body. For those reasons we do not recommend them.  Of course we would proceed to aggressive evaluation of any symptoms that might suggest metastatic disease, but that is not the case here.  Next we went over the options for systemic therapy which are anti-estrogens, anti-HER-2 immunotherapy, and chemotherapy. Samantha [name] does not meet criteria for anti-HER-2 immunotherapy. She is a good candidate for anti-estrogens.  The question of chemotherapy is more complicated. Chemotherapy is most effective in rapidly growing, aggressive tumors. It is much less effective in slow growing cancers, like Samantha [name].  Also in general we feel chemotherapy is not indicated in T1 a tumors that are estrogen receptor positive  Accordingly the plan is for surgery, likely no radiation, then antiestrogens.  Samantha [name] does qualify for genetics testing. In patients who carry a deleterious mutation [for example in a  BRCA Samantha [name]], the risk of a new breast cancer developing in the future may be sufficiently great that the patient may choose bilateral mastectomies. However if she wishes to keep her breasts in that situation it is safe to do so. That would require intensified screening, which generally means not only yearly mammography but a yearly breast MRI as well.  Total encounter time 65 minutes.Samantha Jews C. Krizia Flight, MD 04/27/2020 5:52 PM Medical Oncology and Hematology [facility] [place], [place] [address] Tel. [phone]    Fax. [phone]   This document serves as a record of services personally performed by Lurline Del, MD. It was created on his behalf by Wilburn Mylar, a trained medical scribe. The creation of this  record is based on the scribeScott personal observations and the providerScott statements to them.   I, Lurline Del MD, have reviewed the above documentation for accuracy and completeness, and I agree with the above.    *Total Encounter Time as defined by the Centers for Medicare and Medicaid  Services includes, in addition to the face-to-face time of a patient visit (documented in the note above) non-face-to-face time: obtaining and reviewing outside history, ordering and reviewing medications, tests or procedures, care coordination (communications with other health care professionals or caregivers) and documentation in the medical record.

## 2020-04-26 NOTE — Telephone Encounter (Signed)
Spoke to patient to confirm afternoon [facility] for 4/20

## 2020-04-27 ENCOUNTER — Encounter: Payer: Self-pay | Admitting: *Deleted

## 2020-04-27 ENCOUNTER — Inpatient Hospital Stay: Payer: PPO

## 2020-04-27 ENCOUNTER — Ambulatory Visit (HOSPITAL_BASED_OUTPATIENT_CLINIC_OR_DEPARTMENT_OTHER): Payer: PPO | Admitting: Genetic Counselor

## 2020-04-27 ENCOUNTER — Inpatient Hospital Stay: Payer: PPO | Attending: Oncology | Admitting: Oncology

## 2020-04-27 ENCOUNTER — Ambulatory Visit: Payer: Self-pay | Admitting: General Surgery

## 2020-04-27 ENCOUNTER — Encounter: Payer: Self-pay | Admitting: Oncology

## 2020-04-27 ENCOUNTER — Other Ambulatory Visit: Payer: Self-pay

## 2020-04-27 VITALS — BP 131/78 | HR 92 | Temp 97.7°F | Resp 20 | Ht 63.0 in | Wt 183.3 lb

## 2020-04-27 DIAGNOSIS — C50511 Malignant neoplasm of lower-outer quadrant of right female breast: Secondary | ICD-10-CM | POA: Diagnosis not present

## 2020-04-27 DIAGNOSIS — Z79899 Other long term (current) drug therapy: Secondary | ICD-10-CM | POA: Diagnosis not present

## 2020-04-27 DIAGNOSIS — Z801 Family history of malignant neoplasm of trachea, bronchus and lung: Secondary | ICD-10-CM | POA: Diagnosis not present

## 2020-04-27 DIAGNOSIS — Z170 Estrogen receptor positive status [ER+]: Secondary | ICD-10-CM

## 2020-04-27 DIAGNOSIS — Z8051 Family history of malignant neoplasm of kidney: Secondary | ICD-10-CM

## 2020-04-27 DIAGNOSIS — Z7982 Long term (current) use of aspirin: Secondary | ICD-10-CM | POA: Diagnosis not present

## 2020-04-27 DIAGNOSIS — Z803 Family history of malignant neoplasm of breast: Secondary | ICD-10-CM | POA: Insufficient documentation

## 2020-04-27 DIAGNOSIS — Z808 Family history of malignant neoplasm of other organs or systems: Secondary | ICD-10-CM

## 2020-04-27 DIAGNOSIS — Z87891 Personal history of nicotine dependence: Secondary | ICD-10-CM | POA: Diagnosis not present

## 2020-04-27 DIAGNOSIS — Z8042 Family history of malignant neoplasm of prostate: Secondary | ICD-10-CM | POA: Diagnosis not present

## 2020-04-27 LAB — CBC WITH DIFFERENTIAL (CANCER CENTER ONLY)
Abs Immature Granulocytes: 0.02 10*3/uL (ref 0.00–0.07)
Basophils Absolute: 0 10*3/uL (ref 0.0–0.1)
Basophils Relative: 0 %
Eosinophils Absolute: 0.1 10*3/uL (ref 0.0–0.5)
Eosinophils Relative: 1 %
HCT: 42.1 % (ref 36.0–46.0)
Hemoglobin: 14 g/dL (ref 12.0–15.0)
Immature Granulocytes: 0 %
Lymphocytes Relative: 35 %
Lymphs Abs: 2.6 10*3/uL (ref 0.7–4.0)
MCH: 29.6 pg (ref 26.0–34.0)
MCHC: 33.3 g/dL (ref 30.0–36.0)
MCV: 89 fL (ref 80.0–100.0)
Monocytes Absolute: 0.5 10*3/uL (ref 0.1–1.0)
Monocytes Relative: 6 %
Neutro Abs: 4.2 10*3/uL (ref 1.7–7.7)
Neutrophils Relative %: 58 %
Platelet Count: 248 10*3/uL (ref 150–400)
RBC: 4.73 MIL/uL (ref 3.87–5.11)
RDW: 14 % (ref 11.5–15.5)
WBC Count: 7.4 10*3/uL (ref 4.0–10.5)
nRBC: 0 % (ref 0.0–0.2)

## 2020-04-27 LAB — CMP (CANCER CENTER ONLY)
ALT: 11 U/L (ref 0–44)
AST: 15 U/L (ref 15–41)
Albumin: 4.2 g/dL (ref 3.5–5.0)
Alkaline Phosphatase: 68 U/L (ref 38–126)
Anion gap: 11 (ref 5–15)
BUN: 23 mg/dL (ref 8–23)
CO2: 26 mmol/L (ref 22–32)
Calcium: 9.9 mg/dL (ref 8.9–10.3)
Chloride: 102 mmol/L (ref 98–111)
Creatinine: 1.15 mg/dL — ABNORMAL HIGH (ref 0.44–1.00)
GFR, Estimated: 48 mL/min — ABNORMAL LOW (ref >60)
Glucose, Bld: 122 mg/dL — ABNORMAL HIGH (ref 70–99)
Potassium: 3.7 mmol/L (ref 3.5–5.1)
Sodium: 139 mmol/L (ref 135–145)
Total Bilirubin: 0.5 mg/dL (ref 0.3–1.2)
Total Protein: 7.8 g/dL (ref 6.5–8.1)

## 2020-04-27 LAB — GENETIC SCREENING ORDER

## 2020-04-27 NOTE — Progress Notes (Signed)
[place] Psychosocial Distress Screening Counseling Intern  Counseling intern was referred by distress screening protocol.  The patient scored a 1 on the Psychosocial Distress Thermometer which indicates mild distress. Counseling intern met with patient in exam room" to assess for distress and other psychosocial needs. The patient brought her daughter with her to clinic. They both reported feeling fine and the daughter said they felt better after having information now. The patient reported having good family support and not really having any concerns.   ONCBCN DISTRESS SCREENING 04/27/2020  Screening Type Initial Screening  Distress experienced in past week (1-10) 0  Referral to support programs Yes    Follow up needed: No.   Gaylyn Rong Counseling Intern

## 2020-04-28 ENCOUNTER — Telehealth: Payer: Self-pay | Admitting: *Deleted

## 2020-04-28 ENCOUNTER — Telehealth: Payer: Self-pay | Admitting: Oncology

## 2020-04-28 NOTE — Telephone Encounter (Signed)
Scheduled appt per 4/20 los. Called pt, no answer. Left msg with appt date and time.

## 2020-04-28 NOTE — Telephone Encounter (Signed)
   [facility] Pre-operative Risk Assessment    Patient Name: [name]  DOB: 09/12/1940  MRN: [id number]   HEARTCARE STAFF: - Please ensure there is not already an duplicate clearance open for this procedure. - Under Visit Info/Reason for Call, type in Other and utilize the format Clearance MM/DD/YY or Clearance TBD. Do not use dashes or single digits. - If request is for dental extraction, please clarify the # of teeth to be extracted.  Request for surgical clearance:  1. What type of surgery is being performed? RIGHT BREAST LUMPECTOMY   2. When is this surgery scheduled? TBD   3. What type of clearance is required (medical clearance vs. Pharmacy clearance to hold med vs. Both)? MEDICAL AND DEVICE CLEARANCE; WILL HAND OVER CLEARANCE REQUEST TO THE DEVICE CLINIC  4. Are there any medications that need to be held prior to surgery and how long? ASA   5. Practice name and name of physician performing surgery? [facility]; DR. PAUL TOTH   6. What is the office phone number? [phone]   7.   What is the office fax number? [place]: [place], CMA  8.   Anesthesia type (None, local, MAC, general) ? GENERAL   Julaine Hua 04/28/2020, 5:26 PM  _________________________________________________________________   (provider comments below)

## 2020-04-29 NOTE — Telephone Encounter (Signed)
   Name: [name]  DOB: 08/26/1940  MRN: [id number]   Primary Cardiologist: Cristopher Peru, MD  Chart reviewed as part of pre-operative protocol coverage.   Left a voicemail for patient to call back for ongoing preop assessment.   No cardiac contraindication to holding aspirin 7 days prior to her procedure. Aspirin can be restarted as soon as she is cleared to do so by her surgeon.  Abigail Butts, PA-C 04/29/2020, 10:42 AM

## 2020-05-02 ENCOUNTER — Telehealth: Payer: Self-pay | Admitting: *Deleted

## 2020-05-02 ENCOUNTER — Encounter: Payer: Self-pay | Admitting: *Deleted

## 2020-05-02 ENCOUNTER — Encounter: Payer: Self-pay | Admitting: Genetic Counselor

## 2020-05-02 DIAGNOSIS — Z8042 Family history of malignant neoplasm of prostate: Secondary | ICD-10-CM | POA: Insufficient documentation

## 2020-05-02 DIAGNOSIS — Z808 Family history of malignant neoplasm of other organs or systems: Secondary | ICD-10-CM | POA: Insufficient documentation

## 2020-05-02 DIAGNOSIS — Z8051 Family history of malignant neoplasm of kidney: Secondary | ICD-10-CM | POA: Insufficient documentation

## 2020-05-02 DIAGNOSIS — Z803 Family history of malignant neoplasm of breast: Secondary | ICD-10-CM | POA: Insufficient documentation

## 2020-05-02 DIAGNOSIS — Z801 Family history of malignant neoplasm of trachea, bronchus and lung: Secondary | ICD-10-CM | POA: Insufficient documentation

## 2020-05-02 NOTE — Progress Notes (Signed)
REFERRING PROVIDER: Chauncey Cruel, MD [address] [place],  [place] [address]  PRIMARY PROVIDER:  Mayra Neer, MD  PRIMARY REASON FOR VISIT:  1. Malignant neoplasm of lower-outer quadrant of right breast of female, estrogen receptor positive ([place])   2. Family history of breast cancer   3. Family history of prostate cancer   4. Family history of kidney cancer   5. Family history of thyroid cancer   6. Family history of melanoma   7. Family history of lung cancer      HISTORY OF PRESENT ILLNESS:   [name], a 80 y.o. female, was seen for a [facility] cancer genetics consultation at the request of [name] due to a personal and family history of cancer.  [name] presents to clinic today to discuss the possibility of a hereditary predisposition to cancer, genetic testing, and to further clarify her future cancer risks, as well as potential cancer risks for family members.   In April of 2022, at the age of 3, [name] was diagnosed with invasive ductal carcinoma and ductal carcinoma in situ of the right breast. The tumor is ER+/PR+/Her2-. The treatment plan includes surgery and antiestrogen therapy.  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   CANCER HISTORY:  Oncology History  Malignant neoplasm of lower-outer quadrant of right breast of female, estrogen receptor positive ([place])  04/22/2020 Initial Diagnosis   Malignant neoplasm of lower-outer quadrant of right breast of female, estrogen receptor positive ([place])   04/27/2020 Cancer Staging   Staging form: Breast, AJCC 8th Edition - Clinical stage from 04/27/2020: Stage IA (cT1a, cN0, cM0, G2, ER+, PR+, HER2-) - Signed by Samantha Cruel, MD on 04/27/2020 Stage prefix: Initial diagnosis Histologic grading system: 3 grade system     RISK  FACTORS:  Menarche was at age 51.  First live birth at age 17.  Ovaries intact: one ovary removed.  Hysterectomy: yes.  Menopausal status: postmenopausal.  HRT use: 10 years. Colonoscopy: yes; 01/13/2020. Mammogram within the last year: yes.   Past Medical History:  Diagnosis Date  . Arthritis   . Atrioventricular block, complete ([place])   . Breast cancer ([place])   . Cardiac pacemaker in situ   . CHB (complete heart block) ([place]) 2007  . Essential hypertension, benign   . Family history of breast cancer   . Family history of kidney cancer   . Family history of lung cancer   . Family history of melanoma   . Family history of prostate cancer   . Family history of thyroid cancer   . Frequency of urination   . History of skin cancer   . Hyperlipidemia   . Malignant melanoma () 03/12/2016   LEFT KNEE- TX WITH EXCISION  . Postmenopausal   . SCC (squamous cell carcinoma) 10/27/2014   SCC/KA-LEFT SHIN- TX CURET X 3,5FU  . Thyroid disease    hypothyroid    Past Surgical History:  Procedure Laterality Date  . ABDOMINAL HYSTERECTOMY    . BLADDER REPAIR    . COLONOSCOPY    . KNEE ARTHROSCOPY     rt  . PACEMAKER INSERTION  07-03-2005   [facility]   . PPM GENERATOR CHANGEOUT N/A 04/23/2016   Procedure: PPM Generator Changeout;  Surgeon: Samantha Lance, MD;  Location: [facility];  Service: Cardiovascular;  Laterality: N/A;  . TOTAL KNEE ARTHROPLASTY     right  . TOTAL KNEE ARTHROPLASTY Left 03/14/2015   Procedure: TOTAL KNEE ARTHROPLASTY;  Surgeon: Samantha Arabian, MD;  Location: [facility];  Service: Orthopedics;  Laterality: Left;    Social History   Socioeconomic History  . Marital status: Widowed    Spouse name: Not on file  . Number of children: Not on file  . Years of education: Not on file  . Highest education level: Not on file  Occupational History  . Occupation: retired  Tobacco Use  . Smoking status: Former Smoker    Quit date: 03/06/1986     Years since quitting: 34.1  . Smokeless tobacco: Never Used  Substance and Sexual Activity  . Alcohol use: Yes    Comment: wine rarely  . Drug use: No  . Sexual activity: Not on file  Other Topics Concern  . Not on file  Social History Narrative  . Not on file   Social Determinants of Health   Financial Resource Strain: Not on file  Food Insecurity: Not on file  Transportation Needs: Not on file  Physical Activity: Not on file  Stress: Not on file  Social Connections: Not on file     FAMILY HISTORY:  We obtained a detailed, 4-generation family history.  Significant diagnoses are listed below: Family History  Problem Relation Age of Onset  . Lung cancer Mother 80       smoker  . Heart attack Father        pacemaker & defibrillator  . Breast cancer Sister 80  . Prostate cancer Brother 28  . Kidney cancer Sister 82  . Melanoma Son 56  . Thyroid cancer Niece        dx 59s  . Colon cancer Neg Hx   . Esophageal cancer Neg Hx   . Stomach cancer Neg Hx   . Rectal cancer Neg Hx    [name] has one daughter (age 56) and two sons (ages 32 and 51). Her younger son, [name], was recently diagnosed with melanoma at the age of 9. She has three brothers and four sisters. One sister was diagnosed with breast cancer at age 22. Another sister was diagnosed with kidney cancer at age 30. One brother was diagnosed with prostate cancer at age 52. There is also a niece with a history of thyroid cancer in her 36s.  [name] mother died at age 57 with lung cancer (diagnosed age 69) and was a smoker. There were two maternal aunts and two maternal uncles. There is no known cancer among maternal aunts/uncles or maternal cousins. [name] maternal grandmother died at age 1 without cancer. Her maternal grandfather died in his 12s without cancer.   [name] father died at age 53 without cancer. There was one paternal aunt and two paternal uncles. There is no known cancer among paternal  aunts/uncles or paternal cousins. [name] paternal grandparents died in their 25s without cancer.   [name] is unaware of previous family history of genetic testing for hereditary cancer risks. Patient's ancestors are of [place] descent. There is no reported Ashkenazi Jewish ancestry. There is no known consanguinity.  GENETIC COUNSELING ASSESSMENT: [name] is a 80 y.o. female with a personal history of breast cancer and skin cancer and a family history of breast cancer, prostate cancer, kidney cancer, melanoma, thyroid cancer, and lung cancer, which is somewhat suggestive of a  hereditary cancer syndrome and predisposition to cancer. We, therefore, discussed and recommended the following at today's visit.   DISCUSSION: We discussed that approximately 5-10% of breast cancer is hereditary, with most cases associated with the BRCA1 and BRCA2 genes. There are other genes that can be associated with hereditary breast cancer syndromes. These include ATM, CHEK2, PALB2, etc. We discussed that testing is beneficial for several reasons, including knowing about other cancer risks, identifying potential screening and risk-reduction options that may be appropriate, and to understand if other family members could be at risk for cancer and allow them to undergo genetic testing.   We reviewed the characteristics, features and inheritance patterns of hereditary cancer syndromes. We also discussed genetic testing, including the appropriate family members to test, the process of testing, insurance coverage and turn-around-time for results. We discussed the implications of a negative, positive and/or variant of uncertain significant result. We recommended [name] pursue genetic testing for the [facility] CancerNext-Expanded + RNAinsight panel.   The CancerNext-Expanded + RNAinsight gene panel offered by [employer] and includes sequencing and rearrangement analysis for the following 77 genes: AIP, ALK, APC, ATM,  AXIN2, BAP1, BARD1, BLM, BMPR1A, BRCA1, BRCA2, BRIP1, CDC73, CDH1, CDK4, CDKN1B, CDKN2A, CHEK2, CTNNA1, DICER1, FANCC, FH, FLCN, GALNT12, KIF1B, LZTR1, MAX, MEN1, MET, MLH1, MSH2, MSH3, MSH6, MUTYH, NBN, NF1, NF2, NTHL1, PALB2, PHOX2B, PMS2, POT1, PRKAR1A, PTCH1, PTEN, RAD51C, RAD51D, RB1, RECQL, RET, SDHA, SDHAF2, SDHB, SDHC, SDHD, SMAD4, SMARCA4, SMARCB1, SMARCE1, STK11, SUFU, TMEM127, TP53, TSC1, TSC2, VHL and XRCC2 (sequencing and deletion/duplication); EGFR, EGLN1, HOXB13, KIT, MITF, PDGFRA, POLD1 and POLE (sequencing only); EPCAM and GREM1 (deletion/duplication only). RNA data is routinely analyzed for use in variant interpretation for all genes.  Based on Ms. Barretto's personal and family history of cancer, she meets medical criteria for genetic testing. Despite that she meets criteria, there may still be an out of pocket cost.   PLAN: After considering the risks, benefits, and limitations, [name] provided informed consent to pursue genetic testing and the blood sample was sent to [facility] for analysis of the CancerNext-Expanded + RNAinsight panel. Results should be available within approximately two-three weeks' time, at which point they will be disclosed by telephone to [name], as will any additional recommendations warranted by these results. [name] will receive a summary of her genetic counseling visit and a copy of her results once available. This information will also be available in Epic.   [name] questions were answered to her satisfaction today. Our contact information was provided should additional questions or concerns arise. Thank you for the referral and allowing [place] to share in the care of your patient.   Clint Guy, [place], [facility] Licensed, Certified Dispensing [email or web address] Phone: [phone]  The patient was seen for a total of 20 minutes in face-to-face genetic counseling.  This patient was discussed with Drs.  Magrinat, Lindi Adie and/or Burr Medico who agrees with the above.    _______________________________________________________________________ For Office Staff:  Number of people involved in session: 1 Was an Intern/ student involved with case: no

## 2020-05-02 NOTE — Telephone Encounter (Signed)
   Name: [name]  DOB: [birth year]  MRN: [id number]   Primary Cardiologist: Dr. Cristopher Peru  Chart reviewed as part of pre-operative protocol coverage. Patient was contacted 05/02/2020 in reference to pre-operative risk assessment for pending surgery as outlined below.  [name] was last seen on 12/11/2019 by Joesph July PA-C.  Since that day, [name] has done well without exertional chest pain or worsening dyspnea. She can clearly accomplish more than 4 METS of activity.  Therefore, based on ACC/AHA guidelines, the patient would be at acceptable risk for the planned procedure without further cardiovascular testing.   She may hold aspirin for 7 days prior to the procedure and restart as soon as possible afterward at the surgeon's discretion.  Note, a separate device clearance will need to be sent to our device clinic given the presence of pacemaker and the close proximity of surgery near the device.  The patient was advised that if she develops new symptoms prior to surgery to contact our office to arrange for a follow-up visit, and she verbalized understanding.  I will route this recommendation to the requesting party via Epic fax function and remove from pre-op pool. Please call with questions.  [place], [place] 05/02/2020, 1:58 PM

## 2020-05-02 NOTE — Telephone Encounter (Signed)
Left vm regarding [facility] from 4.20.22. Contact information provided for questions or needs.  

## 2020-05-03 ENCOUNTER — Encounter: Payer: Self-pay | Admitting: Internal Medicine

## 2020-05-03 NOTE — Progress Notes (Signed)
PERIOPERATIVE PRESCRIPTION FOR IMPLANTED CARDIAC DEVICE PROGRAMMING   Patient Information: Name: Samantha Dodgen. Kneece  DOB: 20-Jul-1940  MRN: [id number]    Planned Procedure:  Right Breast Lumpectomy Surgeon:  [facility] Surgery - Dr. Autumn Messing Date of Procedure:  TBD   Device Information:   Clinic EP Physician:   Cristopher Peru, MD Device Type:  Pacemaker Manufacturer and Phone #:  [employer]: [phone] Pacemaker Dependent?:  Yes Date of Last Device Check:  03/08/2020        Normal Device Function?:  Yes     Electrophysiologist's Recommendations:    Have magnet available.  Provide continuous ECG monitoring when magnet is used or reprogramming is to be performed.   Procedure may interfere with device function.  Magnet should be placed over device during procedure.  Per Device Clinic Standing Orders, Simone Curia  05/03/2020 10:49 AM

## 2020-05-05 ENCOUNTER — Encounter: Payer: Self-pay | Admitting: *Deleted

## 2020-05-08 DIAGNOSIS — Z1379 Encounter for other screening for genetic and chromosomal anomalies: Secondary | ICD-10-CM | POA: Insufficient documentation

## 2020-05-09 ENCOUNTER — Telehealth: Payer: Self-pay | Admitting: Genetic Counselor

## 2020-05-09 ENCOUNTER — Encounter: Payer: Self-pay | Admitting: Genetic Counselor

## 2020-05-09 ENCOUNTER — Ambulatory Visit: Payer: Self-pay | Admitting: Genetic Counselor

## 2020-05-09 DIAGNOSIS — Z1379 Encounter for other screening for genetic and chromosomal anomalies: Secondary | ICD-10-CM

## 2020-05-09 NOTE — Progress Notes (Signed)
HPI:  [name] was previously seen in the [place] clinic due to a personal and family history of cancer and concerns regarding a hereditary predisposition to cancer. Please refer to our prior cancer genetics clinic note for more information regarding our discussion, assessment and recommendations, at the time. [name] recent genetic test results were disclosed to her, as were recommendations warranted by these results. These results and recommendations are discussed in more detail below.  CANCER HISTORY:  Oncology History  Malignant neoplasm of lower-outer quadrant of right breast of female, estrogen receptor positive ([place])  04/22/2020 Initial Diagnosis   Malignant neoplasm of lower-outer quadrant of right breast of female, estrogen receptor positive ([place])   04/27/2020 Cancer Staging   Staging form: Breast, AJCC 8th Edition - Clinical stage from 04/27/2020: Stage IA (cT1a, cN0, cM0, G2, ER+, PR+, HER2-) - Signed by Chauncey Cruel, MD on 04/27/2020 Stage prefix: Initial diagnosis Histologic grading system: 3 grade system   05/08/2020 Genetic Testing   Negative genetic testing:  No pathogenic variants detected on the [facility] CancerNext-Expanded + RNAinsight panel. The report Scott is 05/08/2020.   The CancerNext-Expanded + RNAinsight gene panel offered by [employer] and includes sequencing and rearrangement analysis for the following 77 genes: AIP, ALK, APC, ATM, AXIN2, BAP1, BARD1, BLM, BMPR1A, BRCA1, BRCA2, BRIP1, CDC73, CDH1, CDK4, CDKN1B, CDKN2A, CHEK2, CTNNA1, DICER1, FANCC, FH, FLCN, GALNT12, KIF1B, LZTR1, MAX, MEN1, MET, MLH1, MSH2, MSH3, MSH6, MUTYH, NBN, NF1, NF2, NTHL1, PALB2, PHOX2B, PMS2, POT1, PRKAR1A, PTCH1, PTEN, RAD51C, RAD51D, RB1, RECQL, RET, SDHA, SDHAF2, SDHB, SDHC, SDHD, SMAD4, SMARCA4, SMARCB1, SMARCE1, STK11, SUFU, TMEM127, TP53, TSC1, TSC2, VHL and XRCC2 (sequencing and deletion/duplication); EGFR, EGLN1, HOXB13, KIT, MITF, PDGFRA, POLD1 and POLE  (sequencing only); EPCAM and GREM1 (deletion/duplication only). RNA data is routinely analyzed for use in variant interpretation for all genes.     FAMILY HISTORY:  We obtained a detailed, 4-generation family history.  Significant diagnoses are listed below: Family History  Problem Relation Age of Onset  . Lung cancer Mother 74       smoker  . Heart attack Father        pacemaker & defibrillator  . Breast cancer Sister 59  . Prostate cancer Brother 70  . Kidney cancer Sister 76  . Melanoma Son 35  . Thyroid cancer Niece        dx 13s  . Colon cancer Neg Hx   . Esophageal cancer Neg Hx   . Stomach cancer Neg Hx   . Rectal cancer Neg Hx    [name] has one daughter (age 69) and two sons (ages 57 and 69). Her younger son, [name], was recently diagnosed with melanoma at the age of 82. She has three brothers and four sisters. One sister was diagnosed with breast cancer at age 57. Another sister was diagnosed with kidney cancer at age 69. One brother was diagnosed with prostate cancer at age 18. There is also a niece with a history of thyroid cancer in her 44s.  [name] mother died at age 21 with lung cancer (diagnosed age 23) and was a smoker. There were two maternal aunts and two maternal uncles. There is no known cancer among maternal aunts/uncles or maternal cousins. [name] maternal grandmother died at age 57 without cancer. Her maternal grandfather died in his 47s without cancer.   [name] father died at age 42 without cancer. There was one paternal aunt and two paternal uncles. There is no known  cancer among paternal aunts/uncles or paternal cousins. [name] paternal grandparents died in their 76s without cancer.   [name] is unaware of previous family history of genetic testing for hereditary cancer risks. Patient's ancestors are of [place] descent. There is no reported Ashkenazi Jewish ancestry. There is no known consanguinity.  GENETIC TEST RESULTS:  Genetic testing reported out on 05/08/2020 through the [facility] CancerNext-Expanded + RNAinsight panel. No pathogenic variants were detected.   The CancerNext-Expanded + RNAinsight gene panel offered by [employer] and includes sequencing and rearrangement analysis for the following 77 genes: AIP, ALK, APC, ATM, AXIN2, BAP1, BARD1, BLM, BMPR1A, BRCA1, BRCA2, BRIP1, CDC73, CDH1, CDK4, CDKN1B, CDKN2A, CHEK2, CTNNA1, DICER1, FANCC, FH, FLCN, GALNT12, KIF1B, LZTR1, MAX, MEN1, MET, MLH1, MSH2, MSH3, MSH6, MUTYH, NBN, NF1, NF2, NTHL1, PALB2, PHOX2B, PMS2, POT1, PRKAR1A, PTCH1, PTEN, RAD51C, RAD51D, RB1, RECQL, RET, SDHA, SDHAF2, SDHB, SDHC, SDHD, SMAD4, SMARCA4, SMARCB1, SMARCE1, STK11, SUFU, TMEM127, TP53, TSC1, TSC2, VHL and XRCC2 (sequencing and deletion/duplication); EGFR, EGLN1, HOXB13, KIT, MITF, PDGFRA, POLD1 and POLE (sequencing only); EPCAM and GREM1 (deletion/duplication only). RNA data is routinely analyzed for use in variant interpretation for all genes. The test report will be scanned into EPIC and located under the Molecular Pathology section of the Results Review tab.  A portion of the result report is included below for reference.     We discussed with [name] that because current genetic testing is not perfect, it is possible there may be a gene mutation in one of these genes that current testing cannot detect, but that chance is small.  We also discussed that there could be another gene that has not yet been discovered, or that we have not yet tested, that is responsible for the cancer diagnoses in the family. It is also possible there is a hereditary cause for the cancer in the family that [name] did not inherit and therefore was not identified in her testing.  Therefore, it is important to remain in touch with cancer genetics in the future so that we can continue to offer [name] the most up to Scott genetic testing.   CANCER SCREENING RECOMMENDATIONS: [name] test result is  considered negative (normal).  This means that we have not identified a hereditary cause for her personal and family history of cancer at this time. While reassuring, this does not definitively rule out a hereditary predisposition to cancer. It is still possible that there could be genetic mutations that are undetectable by current technology. There could be genetic mutations in genes that have not been tested or identified to increase cancer risk.  Therefore, it is recommended she continue to follow the cancer management and screening guidelines provided by her oncology and primary healthcare provider.   An individual's cancer risk and medical management are not determined by genetic test results alone. Overall cancer risk assessment incorporates additional factors, including personal medical history, family history, and any available genetic information that may result in a personalized plan for cancer prevention and surveillance.  RECOMMENDATIONS FOR FAMILY MEMBERS:  Individuals in this family might be at some increased risk of developing cancer, over the general population risk, simply due to the family history of cancer.  We recommended women in this family have a yearly mammogram beginning at age 59, or 82 years younger than the earliest onset of cancer, an annual clinical breast exam, and perform monthly breast self-exams. Women in this family should also have a gynecological exam as recommended by their primary provider.  All family members should be referred for colonoscopy starting at age 29.  It is also possible there is a hereditary cause for the cancer in [name]'s family that she did not inherit and therefore was not identified in her.  Based on [name]'s family history, we recommended her sister, who was diagnosed with kidney cancer at age 77, have genetic counseling and testing. [name] will let us know if we can be of any assistance in coordinating genetic counseling and/or testing  for this family member.   FOLLOW-UP: Lastly, we discussed with [name] that cancer genetics is a rapidly advancing field and it is possible that new genetic tests will be appropriate for her and/or her family members in the future. We encouraged her to remain in contact with cancer genetics on an annual basis so we can update her personal and family histories and let her know of advances in cancer genetics that may benefit this family.   Our contact number was provided. [name] questions were answered to her satisfaction, and she knows she is welcome to call us at anytime with additional questions or concerns.   Clint Guy, MS, [facility] Genetic Counselor [email or web address] Phone: [phone]

## 2020-05-09 NOTE — Progress Notes (Signed)
Location of Breast Cancer: Malignant neoplasm of lower-outer quadrant of RIGHT breast, estrogen receptor positive   Histology per Pathology Report:  (definitive pathology pending upcoming lumpectomy) 04/20/2020 Breast, right, needle core biopsy, 10 cmfn, 8 o'clock - INVASIVE DUCTAL CARCINOMA, GRADE 2/3. - DUCTAL CARCINOMA IN SITU.  Receptor Status: ER(95%), PR (95%), Her2-neu (Negative via FISH), Ki-67(10%)  Did patient present with symptoms (if so, please note symptoms) or was this found on screening mammography?:  Patient  had routine screening mammography on 03/28/2020 showing a possible abnormality in the right breast. She underwent right diagnostic mammography with tomography and right breast ultrasonography at [facility] on 04/07/2020 showing: breast density category A; 0.5 cm round mass in right breast at 8 o'clock; no abnormal nodes in right axilla.  Past/Anticipated interventions by surgeon, if any: 05/26/2020 Dr. Autumn Messing Scheduled for right breast lumpectomy with radioactive seed localization   Past/Anticipated interventions by medical oncology, if any:  Under care of Dr. Sarajane Jews Magrinat 04/27/2020 (1) genetics testing pending (2) definitive surgery pending (3) likely no adjuvant radiation (4) antiestrogen --F/U 07/05/2020  Lymphedema issues, if any:  Patient denies    Pain issues, if any:  Patient denies   SAFETY ISSUES:  Prior radiation? 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 No--hysterectomy  Is the patient on methotrexate? No  Current Complaints / other details:  Nothing else of note

## 2020-05-09 NOTE — Telephone Encounter (Signed)
Revealed negative genetic testing. Discussed that we do not know why she has breast cancer or why there is cancer in the family. There could be a genetic mutation in the family that [name] did not inherit. There could also be a mutation in a different gene that we are not testing, or our current technology may not be able to detect certain mutations. It will therefore be important for her to stay in contact with genetics to keep up with whether additional testing may be appropriate in the future.

## 2020-05-10 ENCOUNTER — Ambulatory Visit
Admission: RE | Admit: 2020-05-10 | Discharge: 2020-05-10 | Disposition: A | Discharge status: Home / Self Care | Payer: PPO | Source: Ambulatory Visit | Attending: Radiation Oncology | Admitting: Radiation Oncology

## 2020-05-10 ENCOUNTER — Encounter: Payer: Self-pay | Admitting: Radiation Oncology

## 2020-05-10 DIAGNOSIS — C50511 Malignant neoplasm of lower-outer quadrant of right female breast: Secondary | ICD-10-CM

## 2020-05-10 DIAGNOSIS — Z170 Estrogen receptor positive status [ER+]: Secondary | ICD-10-CM | POA: Diagnosis not present

## 2020-05-10 NOTE — Progress Notes (Signed)
Radiation Oncology         [phone] ________________________________  Initial outpatient Consultation by telephone.  The patient opted for telemedicine to maximize safety during the pandemic.  MyChart video was not obtainable.   Name: [name] MRN: [id number]  Date: 05/10/2020  DOB: 10/17/1940  BJ:SEGB, Samantha May, MD  Jovita Kussmaul, MD   REFERRING PHYSICIAN: Autumn Messing III, MD  DIAGNOSIS:    ICD-10-CM   1. Malignant neoplasm of lower-outer quadrant of right breast of female, estrogen receptor positive ([place])  C50.511    Z17.0    Cancer Staging Malignant neoplasm of lower-outer quadrant of right breast of female, estrogen receptor positive ([place]) Staging form: Breast, AJCC 8th Edition - Clinical stage from 04/27/2020: Stage IA (cT1a, cN0, cM0, G2, ER+, PR+, HER2-) - Signed by Chauncey Cruel, MD on 04/27/2020 Stage prefix: Initial diagnosis Histologic grading system: 3 grade system  CHIEF COMPLAINT: Here to discuss management of right breast cancer  HISTORY OF PRESENT ILLNESS::[name] is a 80 y.o. female who presented with right breast abnormality on the following imaging: bilateral screening mammogram on the date of 03/28/2020. No symptoms were reported at that time. Ultrasound of the right breast on 04/07/2020 revealed a 0.4 x 0.4 x 0.5 cm round mass that was suspicious for malignancy. No abnormal nodes were seen in the right axilla. Biopsy on the date of 04/20/2020 showed invasive ductal carcinoma with DCIS. ER status: >95% strong; PR status: >95% strong; Her2 status: negative; Grade: 2.  She had negative genetic testing. Lumpectomy pending.  She has met with medical oncology and plans to take antiestrogens adjuvantly.  She has a left-sided pacemaker.  She has received 2 Moderna vaccines against COVID, her second  in September 2021.  PREVIOUS RADIATION THERAPY: No  PAST MEDICAL HISTORY:  has a past medical history of Arthritis, Atrioventricular block,  complete ([place]), Breast cancer ([place]), Cardiac pacemaker in situ, CHB (complete heart block) ([place]) (2007), Essential hypertension, benign, Family history of breast cancer, Family history of kidney cancer, Family history of lung cancer, Family history of melanoma, Family history of prostate cancer, Family history of thyroid cancer, Frequency of urination, History of skin cancer, Hyperlipidemia, Malignant melanoma ([place]) (03/12/2016), Postmenopausal, SCC (squamous cell carcinoma) (10/27/2014), and Thyroid disease.    PAST SURGICAL HISTORY: Past Surgical History:  Procedure Laterality Date  . ABDOMINAL HYSTERECTOMY    . BLADDER REPAIR    . COLONOSCOPY    . KNEE ARTHROSCOPY     rt  . PACEMAKER INSERTION  07-03-2005   [facility]   . PPM GENERATOR CHANGEOUT N/A 04/23/2016   Procedure: PPM Generator Changeout;  Surgeon: Evans Lance, MD;  Location: [facility];  Service: Cardiovascular;  Laterality: N/A;  . TOTAL KNEE ARTHROPLASTY     right  . TOTAL KNEE ARTHROPLASTY Left 03/14/2015   Procedure: TOTAL KNEE ARTHROPLASTY;  Surgeon: Gaynelle Arabian, MD;  Location: [facility];  Service: Orthopedics;  Laterality: Left;    FAMILY HISTORY: family history includes Breast cancer (age of onset: 36) in her sister; Heart attack in her father; Kidney cancer (age of onset: 93) in her sister; Lung cancer (age of onset: 9) in her mother; Melanoma (age of onset: 48) in her son; Prostate cancer (age of onset: 79) in her brother; Thyroid cancer in her niece.  SOCIAL HISTORY:  reports that she quit smoking about 34 years ago. She has never used smokeless tobacco. She reports previous alcohol use. She reports that she does  not use drugs.  ALLERGIES: Lipitor [atorvastatin]  MEDICATIONS:  Current Outpatient Medications  Medication Sig Dispense Refill  . amLODipine (NORVASC) 5 MG tablet Take 5 mg by mouth Daily.     Marland Kitchen aspirin EC 81 MG tablet Take 81 mg by mouth daily.    . Cholecalciferol (VITAMIN D3 PO)  Take 2,000 Units by mouth daily.    Marland Kitchen levothyroxine (SYNTHROID, LEVOTHROID) 100 MCG tablet Take 100 mcg by mouth daily.    Marland Kitchen lisinopril-hydrochlorothiazide (PRINZIDE,ZESTORETIC) 20-25 MG per tablet Take 1 tablet by mouth Daily.    . pravastatin (PRAVACHOL) 40 MG tablet Take 40 mg by mouth Daily.      No current facility-administered medications for this encounter.    REVIEW OF SYSTEMS: As above   PHYSICAL EXAM:  vitals were not taken for this visit.   General: Alert and oriented, in no acute distress    LABORATORY DATA:  Lab Results  Component Value Date   WBC 7.4 04/27/2020   HGB 14.0 04/27/2020   HCT 42.1 04/27/2020   MCV 89.0 04/27/2020   PLT 248 04/27/2020   CMP     Component Value Date/Time   NA 139 04/27/2020 1210   NA 140 04/18/2016 1035   K 3.7 04/27/2020 1210   CL 102 04/27/2020 1210   CO2 26 04/27/2020 1210   GLUCOSE 122 (H) 04/27/2020 1210   BUN 23 04/27/2020 1210   BUN 25 04/18/2016 1035   CREATININE 1.15 (H) 04/27/2020 1210   CALCIUM 9.9 04/27/2020 1210   PROT 7.8 04/27/2020 1210   ALBUMIN 4.2 04/27/2020 1210   AST 15 04/27/2020 1210   ALT 11 04/27/2020 1210   ALKPHOS 68 04/27/2020 1210   BILITOT 0.5 04/27/2020 1210   GFRNONAA 48 (L) 04/27/2020 1210   GFRAA 65 04/18/2016 1035         RADIOGRAPHY: As above   IMPRESSION/PLAN: Right breast cancer  It was a pleasure meeting the patient today.   For the patient's early stage favorable risk breast cancer, we had a thorough discussion about her options for adjuvant therapy. One option would be antiestrogen therapy as discussed with medical oncology. She would take a pill for approximately 5 years. A more aggressive option would be to pursue both modalities of antiestrogens and adjuvant radiation therapy for 3 and half weeks (but I told her I thought this would be more therapy than necessary).   Of note, I discussed the data from the [employer] al trial in the [place] [facility]. She  understands that tamoxifen compared to radiation plus tamoxifen demonstrated no survival benefit among the women in this study. The women were 17 years or older with stage I estrogen receptor positive breast cancer. Extrapolating from this study, I told Ms. Vosler that her overall life expectancy should not be affected by adding radiotherapy to antiestrogen medication. She understands that the main benefit of radiotherapy would be a very small but measurable local control benefit (risk of local recurrence to be lowered from ~9% --> ~2%).    We discussed the risks benefits and side effects of radiotherapy. She understands that the side effects would likely include some skin irritation and fatigue during the weeks of radiation.    She is interested in pursuing antiestrogen therapy alone adjuvantly.  She knows that her case will be reviewed by the  tumor board after her lumpectomy and if her disease is significantly more advanced than expected or her margin status is insufficient we can reconsider radiation  under the circumstances.  Otherwise I will see her back on an as-needed basis.  She and her daughter are pleased with this plan and know to call they have any questions in the future.  We discussed measures to reduce the risk of infection during the COVID-19 pandemic.  She is due for her booster shot.  I recommended that she receive this but she is quite firm right now that she does not wish to get any more vaccinations.  I told her that we do offer booster shots here at the cancer center and she has our contact information in case she changes her mind.  I wished her the very best moving forward.  This encounter was provided by telemedicine platform; patient desired telemedicine during pandemic precautions.  MyChart video was not available and therefore telephone was used. The patient has given verbal consent for this type of encounter and has been advised to only accept a meeting of this type in a  secure network environment. On date of service, in total, I spent 30 minutes on this encounter.   The attendants for this meeting include Eppie Gibson  and [name] During the encounter, Eppie Gibson was located at [facility].  [name] was located at home.   __________________________________________   Eppie Gibson, MD  This document serves as a record of services personally performed by Eppie Gibson, MD. It was created on his behalf by Clerance Lav, a trained medical scribe. The creation of this record is based on the scribe's personal observations and the provider's statements to them. This document has been checked and approved by the attending provider.

## 2020-05-18 ENCOUNTER — Encounter (HOSPITAL_BASED_OUTPATIENT_CLINIC_OR_DEPARTMENT_OTHER): Payer: Self-pay | Admitting: General Surgery

## 2020-05-18 ENCOUNTER — Other Ambulatory Visit: Payer: Self-pay

## 2020-05-19 ENCOUNTER — Encounter: Payer: Self-pay | Admitting: Internal Medicine

## 2020-05-19 NOTE — Progress Notes (Signed)
[facility]ICE [facility]GRAMM[facility]  Patient Information: Name:  [name]  DOB:  22-May-1940  MRN:  [id number]    Planned Procedure: Right breast lumpectomy  Surgeon: Dr Marlou Starks  Date of Procedure: 05-26-20  Cautery will be used.  Position during surgery: supine   Please send documentation back to:  [facility] (Fax # [phone])    Device Information:  Clinic EP Physician:  Cristopher Peru, MD   Device Type:  Pacemaker Manufacturer and Phone #:  [employer]: [phone] Pacemaker Dependent?:  Yes.   Date of Last Device Check:  03/08/20 Normal Device Function?:  Yes.    Electrophysiologist's Recommendations:   Have magnet available.  Provide continuous ECG monitoring when magnet is used or reprogramming is to be performed.   Procedure may interfere with device function.  Magnet should be placed over device during procedure.  Per Device Clinic Standing Orders, Simone Curia, RN  12:42 PM 05/19/2020

## 2020-05-23 ENCOUNTER — Other Ambulatory Visit (HOSPITAL_COMMUNITY): Payer: PPO

## 2020-05-23 ENCOUNTER — Encounter (HOSPITAL_BASED_OUTPATIENT_CLINIC_OR_DEPARTMENT_OTHER)
Admission: RE | Admit: 2020-05-23 | Discharge: 2020-05-23 | Disposition: A | Discharge status: Home / Self Care | Payer: PPO | Source: Ambulatory Visit | Attending: General Surgery | Admitting: General Surgery

## 2020-05-23 DIAGNOSIS — Z01812 Encounter for preprocedural laboratory examination: Secondary | ICD-10-CM | POA: Insufficient documentation

## 2020-05-23 LAB — BASIC METABOLIC PANEL
Anion gap: 8 (ref 5–15)
BUN: 18 mg/dL (ref 8–23)
CO2: 27 mmol/L (ref 22–32)
Calcium: 9.5 mg/dL (ref 8.9–10.3)
Chloride: 102 mmol/L (ref 98–111)
Creatinine, Ser: 1.15 mg/dL — ABNORMAL HIGH (ref 0.44–1.00)
GFR, Estimated: 48 mL/min — ABNORMAL LOW (ref >60)
Glucose, Bld: 88 mg/dL (ref 70–99)
Potassium: 4.4 mmol/L (ref 3.5–5.1)
Sodium: 137 mmol/L (ref 135–145)

## 2020-05-23 MED ORDER — CHLORHEXIDINE GLUCONATE CLOTH 2 % EX PADS: 6.0000 | MEDICATED_PAD | Freq: Once | CUTANEOUS | Status: DC

## 2020-05-23 NOTE — Progress Notes (Signed)
      Enhanced Recovery after Surgery for Orthopedics Enhanced Recovery after Surgery is a protocol used to improve the stress on your body and your recovery after surgery.  Patient Instructions  . The night before surgery:  o No food after midnight. ONLY clear liquids after midnight  . The day of surgery (if you do NOT have diabetes):  o Drink ONE (1) Pre-Surgery Clear Ensure as directed.   o This drink was given to you during your hospital  pre-op appointment visit. o The pre-op nurse will instruct you on the time to drink the  Pre-Surgery Ensure depending on your surgery time. o Finish the drink at the designated time by the pre-op nurse.  o Nothing else to drink after completing the  Pre-Surgery Clear Ensure.  . The day of surgery (if you have diabetes): o Drink ONE (1) Gatorade 2 (G2) as directed. o This drink was given to you during your hospital  pre-op appointment visit.  o The pre-op nurse will instruct you on the time to drink the   Gatorade 2 (G2) depending on your surgery time. o Color of the Gatorade may vary. Red is not allowed. o Nothing else to drink after completing the  Gatorade 2 (G2).         If you have questions, please contact your surgeon's office.  Surgical soap given to the pt and explained how to use it.  Pt verbalized understandings.

## 2020-05-25 DIAGNOSIS — C50511 Malignant neoplasm of lower-outer quadrant of right female breast: Secondary | ICD-10-CM | POA: Diagnosis not present

## 2020-05-26 ENCOUNTER — Encounter (HOSPITAL_BASED_OUTPATIENT_CLINIC_OR_DEPARTMENT_OTHER)
Admission: RE | Disposition: A | Discharge status: Home / Self Care | Payer: Self-pay | Source: Home / Self Care | Attending: General Surgery

## 2020-05-26 ENCOUNTER — Ambulatory Visit (HOSPITAL_BASED_OUTPATIENT_CLINIC_OR_DEPARTMENT_OTHER): Payer: PPO | Admitting: Certified Registered"

## 2020-05-26 ENCOUNTER — Other Ambulatory Visit: Payer: Self-pay

## 2020-05-26 ENCOUNTER — Encounter (HOSPITAL_BASED_OUTPATIENT_CLINIC_OR_DEPARTMENT_OTHER): Payer: Self-pay | Admitting: General Surgery

## 2020-05-26 ENCOUNTER — Ambulatory Visit (HOSPITAL_BASED_OUTPATIENT_CLINIC_OR_DEPARTMENT_OTHER)
Admission: RE | Admit: 2020-05-26 | Discharge: 2020-05-26 | Disposition: A | Discharge status: Home / Self Care | Payer: PPO | Attending: General Surgery | Admitting: General Surgery

## 2020-05-26 DIAGNOSIS — Z170 Estrogen receptor positive status [ER+]: Secondary | ICD-10-CM | POA: Insufficient documentation

## 2020-05-26 DIAGNOSIS — Z90710 Acquired absence of both cervix and uterus: Secondary | ICD-10-CM | POA: Insufficient documentation

## 2020-05-26 DIAGNOSIS — Z87891 Personal history of nicotine dependence: Secondary | ICD-10-CM | POA: Diagnosis not present

## 2020-05-26 DIAGNOSIS — Z950 Presence of cardiac pacemaker: Secondary | ICD-10-CM | POA: Insufficient documentation

## 2020-05-26 DIAGNOSIS — Z803 Family history of malignant neoplasm of breast: Secondary | ICD-10-CM | POA: Diagnosis not present

## 2020-05-26 DIAGNOSIS — I459 Conduction disorder, unspecified: Secondary | ICD-10-CM | POA: Diagnosis not present

## 2020-05-26 DIAGNOSIS — C50511 Malignant neoplasm of lower-outer quadrant of right female breast: Secondary | ICD-10-CM | POA: Diagnosis not present

## 2020-05-26 DIAGNOSIS — E039 Hypothyroidism, unspecified: Secondary | ICD-10-CM | POA: Diagnosis not present

## 2020-05-26 DIAGNOSIS — I10 Essential (primary) hypertension: Secondary | ICD-10-CM | POA: Diagnosis not present

## 2020-05-26 DIAGNOSIS — C50911 Malignant neoplasm of unspecified site of right female breast: Secondary | ICD-10-CM | POA: Diagnosis not present

## 2020-05-26 DIAGNOSIS — C50411 Malignant neoplasm of upper-outer quadrant of right female breast: Secondary | ICD-10-CM | POA: Diagnosis not present

## 2020-05-26 HISTORY — PX: BREAST LUMPECTOMY WITH RADIOACTIVE SEED LOCALIZATION: SHX6424

## 2020-05-26 HISTORY — DX: Hypothyroidism, unspecified: E03.9

## 2020-05-26 SURGERY — BREAST LUMPECTOMY WITH RADIOACTIVE SEED LOCALIZATION
Anesthesia: General | Site: Breast | Laterality: Right

## 2020-05-26 MED ORDER — OXYCODONE HCL 5 MG/5ML PO SOLN: 5.0000 mg | Freq: Once | ORAL | Status: DC | PRN

## 2020-05-26 MED ORDER — ACETAMINOPHEN 325 MG PO TABS
325.0000 mg | ORAL_TABLET | ORAL | Status: DC | PRN
Start: 2020-05-26 — End: 2020-05-26

## 2020-05-26 MED ORDER — PROPOFOL 10 MG/ML IV BOLUS
INTRAVENOUS | Status: AC
  Filled 2020-05-26: qty 20

## 2020-05-26 MED ORDER — ACETAMINOPHEN 500 MG PO TABS
1000.0000 mg | ORAL_TABLET | ORAL | Status: AC
  Administered 2020-05-26: 1000 mg via ORAL

## 2020-05-26 MED ORDER — CEFAZOLIN SODIUM-DEXTROSE 2-4 GM/100ML-% IV SOLN
2.0000 g | INTRAVENOUS | Status: AC
  Administered 2020-05-26: 2 g via INTRAVENOUS

## 2020-05-26 MED ORDER — OXYCODONE HCL 5 MG PO TABS: 5.0000 mg | ORAL_TABLET | Freq: Once | ORAL | Status: DC | PRN

## 2020-05-26 MED ORDER — CEFAZOLIN SODIUM-DEXTROSE 2-4 GM/100ML-% IV SOLN
INTRAVENOUS | Status: AC
  Filled 2020-05-26: qty 100

## 2020-05-26 MED ORDER — CELECOXIB 200 MG PO CAPS
200.0000 mg | ORAL_CAPSULE | ORAL | Status: AC
  Administered 2020-05-26: 200 mg via ORAL

## 2020-05-26 MED ORDER — ONDANSETRON HCL 4 MG/2ML IJ SOLN
INTRAMUSCULAR | Status: DC | PRN
  Administered 2020-05-26: 4 mg via INTRAVENOUS

## 2020-05-26 MED ORDER — LIDOCAINE 2% (20 MG/ML) 5 ML SYRINGE
INTRAMUSCULAR | Status: AC
  Filled 2020-05-26: qty 5

## 2020-05-26 MED ORDER — EPHEDRINE 5 MG/ML INJ
INTRAVENOUS | Status: AC
  Filled 2020-05-26: qty 10

## 2020-05-26 MED ORDER — HYDROCODONE-ACETAMINOPHEN 5-325 MG PO TABS: 1.0000 | ORAL_TABLET | Freq: Four times a day (QID) | ORAL | 0 refills | Status: DC | PRN

## 2020-05-26 MED ORDER — LACTATED RINGERS IV SOLN: INTRAVENOUS | Status: DC

## 2020-05-26 MED ORDER — ONDANSETRON HCL 4 MG/2ML IJ SOLN
INTRAMUSCULAR | Status: AC
  Filled 2020-05-26: qty 2

## 2020-05-26 MED ORDER — ACETAMINOPHEN 160 MG/5ML PO SOLN: 325.0000 mg | ORAL | Status: DC | PRN

## 2020-05-26 MED ORDER — FENTANYL CITRATE (PF) 100 MCG/2ML IJ SOLN: 25.0000 ug | INTRAMUSCULAR | Status: DC | PRN

## 2020-05-26 MED ORDER — GABAPENTIN 300 MG PO CAPS
ORAL_CAPSULE | ORAL | Status: AC
  Filled 2020-05-26: qty 1

## 2020-05-26 MED ORDER — PROPOFOL 10 MG/ML IV BOLUS
INTRAVENOUS | Status: DC | PRN
  Administered 2020-05-26: 80 mg via INTRAVENOUS
  Administered 2020-05-26: 120 mg via INTRAVENOUS

## 2020-05-26 MED ORDER — GABAPENTIN 300 MG PO CAPS
300.0000 mg | ORAL_CAPSULE | ORAL | Status: AC
  Administered 2020-05-26: 300 mg via ORAL

## 2020-05-26 MED ORDER — ACETAMINOPHEN 500 MG PO TABS
ORAL_TABLET | ORAL | Status: AC
  Filled 2020-05-26: qty 2

## 2020-05-26 MED ORDER — FENTANYL CITRATE (PF) 100 MCG/2ML IJ SOLN
INTRAMUSCULAR | Status: DC | PRN
  Administered 2020-05-26: 25 ug via INTRAVENOUS

## 2020-05-26 MED ORDER — ONDANSETRON HCL 4 MG/2ML IJ SOLN: 4.0000 mg | Freq: Once | INTRAMUSCULAR | Status: DC | PRN

## 2020-05-26 MED ORDER — FENTANYL CITRATE (PF) 100 MCG/2ML IJ SOLN
INTRAMUSCULAR | Status: AC
  Filled 2020-05-26: qty 2

## 2020-05-26 MED ORDER — LIDOCAINE HCL (CARDIAC) PF 100 MG/5ML IV SOSY
PREFILLED_SYRINGE | INTRAVENOUS | Status: DC | PRN
  Administered 2020-05-26: 100 mg via INTRAVENOUS

## 2020-05-26 MED ORDER — DEXAMETHASONE SODIUM PHOSPHATE 10 MG/ML IJ SOLN
INTRAMUSCULAR | Status: DC | PRN
  Administered 2020-05-26: 5 mg via INTRAVENOUS

## 2020-05-26 MED ORDER — CELECOXIB 200 MG PO CAPS
ORAL_CAPSULE | ORAL | Status: AC
  Filled 2020-05-26: qty 1

## 2020-05-26 MED ORDER — DEXAMETHASONE SODIUM PHOSPHATE 10 MG/ML IJ SOLN
INTRAMUSCULAR | Status: AC
  Filled 2020-05-26: qty 1

## 2020-05-26 MED ORDER — EPHEDRINE SULFATE 50 MG/ML IJ SOLN
INTRAMUSCULAR | Status: DC | PRN
  Administered 2020-05-26: 10 mg via INTRAVENOUS
  Administered 2020-05-26: 15 mg via INTRAVENOUS

## 2020-05-26 MED ORDER — BUPIVACAINE-EPINEPHRINE (PF) 0.25% -1:200000 IJ SOLN
INTRAMUSCULAR | Status: DC | PRN
  Administered 2020-05-26: 20 mL

## 2020-05-26 SURGICAL SUPPLY — 44 items
ADH SKN CLS APL DERMABOND .7 (GAUZE/BANDAGES/DRESSINGS) ×1
APL PRP STRL LF DISP 70% ISPRP (MISCELLANEOUS) ×1
APPLIER CLIP 9.375 MED OPEN (MISCELLANEOUS)
APR CLP MED 9.3 20 MLT OPN (MISCELLANEOUS)
BLADE SURG 15 STRL LF DISP TIS (BLADE) ×1 IMPLANT
BLADE SURG 15 STRL SS (BLADE) ×2
CANISTER SUC SOCK COL 7IN (MISCELLANEOUS) ×2 IMPLANT
CANISTER SUCT 1200ML W/VALVE (MISCELLANEOUS) ×2 IMPLANT
CHLORAPREP W/TINT 26 (MISCELLANEOUS) ×2 IMPLANT
CLIP APPLIE 9.375 MED OPEN (MISCELLANEOUS) IMPLANT
COVER BACK TABLE 60X90IN (DRAPES) ×2 IMPLANT
COVER MAYO STAND STRL (DRAPES) ×2 IMPLANT
COVER PROBE W GEL 5X96 (DRAPES) ×2 IMPLANT
DECANTER SPIKE VIAL GLASS SM (MISCELLANEOUS) IMPLANT
DERMABOND ADVANCED (GAUZE/BANDAGES/DRESSINGS) ×1
DERMABOND ADVANCED .7 DNX12 (GAUZE/BANDAGES/DRESSINGS) ×1 IMPLANT
DRAPE LAPAROSCOPIC ABDOMINAL (DRAPES) ×2 IMPLANT
DRAPE UTILITY XL STRL (DRAPES) ×2 IMPLANT
ELECT COATED BLADE 2.86 ST (ELECTRODE) ×1 IMPLANT
ELECT REM PT RETURN 9FT ADLT (ELECTROSURGICAL) ×2
ELECTRODE REM PT RTRN 9FT ADLT (ELECTROSURGICAL) ×1 IMPLANT
GLOVE SURG ENC MOIS LTX SZ7.5 (GLOVE) ×4 IMPLANT
GLOVE SURG LTX SZ6.5 (GLOVE) ×1 IMPLANT
GOWN STRL REUS W/ TWL LRG LVL3 (GOWN DISPOSABLE) ×2 IMPLANT
GOWN STRL REUS W/TWL LRG LVL3 (GOWN DISPOSABLE) ×4
ILLUMINATOR WAVEGUIDE N/F (MISCELLANEOUS) IMPLANT
KIT MARKER MARGIN INK (KITS) ×2 IMPLANT
LIGHT WAVEGUIDE WIDE FLAT (MISCELLANEOUS) IMPLANT
NDL HYPO 25X1 1.5 SAFETY (NEEDLE) IMPLANT
NEEDLE HYPO 25X1 1.5 SAFETY (NEEDLE) ×2 IMPLANT
NS IRRIG 1000ML POUR BTL (IV SOLUTION) ×1 IMPLANT
PACK BASIN DAY SURGERY FS (CUSTOM PROCEDURE TRAY) ×2 IMPLANT
PENCIL SMOKE EVACUATOR (MISCELLANEOUS) ×1 IMPLANT
SHEARS HARMONIC 9CM CVD (BLADE) ×1 IMPLANT
SLEEVE SCD COMPRESS KNEE MED (STOCKING) ×2 IMPLANT
SPONGE LAP 18X18 RF (DISPOSABLE) ×2 IMPLANT
SUT MON AB 4-0 PC3 18 (SUTURE) ×2 IMPLANT
SUT SILK 2 0 SH (SUTURE) IMPLANT
SUT VICRYL 3-0 CR8 SH (SUTURE) ×2 IMPLANT
SYR CONTROL 10ML LL (SYRINGE) ×1 IMPLANT
TOWEL GREEN STERILE FF (TOWEL DISPOSABLE) ×2 IMPLANT
TRAY FAXITRON CT DISP (TRAY / TRAY PROCEDURE) ×2 IMPLANT
TUBE CONNECTING 20X1/4 (TUBING) ×2 IMPLANT
YANKAUER SUCT BULB TIP NO VENT (SUCTIONS) IMPLANT

## 2020-05-26 NOTE — Anesthesia Procedure Notes (Signed)
Procedure Name: LMA Insertion Date/Time: 05/26/2020 10:51 AM Performed by: Lavonia Dana, CRNA Pre-anesthesia Checklist: Patient identified, Emergency Drugs available, Suction available and Patient being monitored Patient Re-evaluated:Patient Re-evaluated prior to induction Oxygen Delivery Method: Circle system utilized Preoxygenation: Pre-oxygenation with 100% oxygen Induction Type: IV induction Ventilation: Mask ventilation without difficulty LMA: LMA inserted LMA Size: 4.0 Number of attempts: 1 Airway Equipment and Method: Bite block Placement Confirmation: positive ETCO2 Tube secured with: Tape Dental Injury: Teeth and Oropharynx as per pre-operative assessment

## 2020-05-26 NOTE — Op Note (Signed)
05/26/2020  1:12 PM  PATIENT:  [name]  80 y.o. female  PRE-OPERATIVE DIAGNOSIS:  RIGHT BREAST CANCER  POST-OPERATIVE DIAGNOSIS:  RIGHT BREAST CANCER  PROCEDURE:  Procedure(s): RIGHT BREAST LUMPECTOMY WITH RADIOACTIVE SEED LOCALIZATION (Right)  SURGEON:  Surgeon(s) and Role:    * Jovita Kussmaul, MD - Primary  PHYSICIAN ASSISTANT:   ASSISTANTS: none   ANESTHESIA:   local and general  EBL:  5 mL   BLOOD ADMINISTERED:none  DRAINS: none   LOCAL MEDICATIONS USED:  MARCAINE     SPECIMEN:  Source of Specimen:  right breast tissue with additional superior margin  DISPOSITION OF SPECIMEN:  PATHOLOGY  COUNTS:  YES  TOURNIQUET:  * No tourniquets in log *  DICTATION: .Dragon Dictation   After informed consent was obtained the patient was brought to the operating room and placed in the supine position on the operating table.  After adequate induction of general anesthesia the patient's right breast was prepped with ChloraPrep, allowed to dry, and draped in usual sterile manner.  An appropriate timeout was performed.  Previously an I-125 seed was placed in the lower outer aspect of the right breast to mark an area of invasive breast cancer.  The neoprobe was set to I-125 in the area of radioactivity was readily identified.  Because of the patient's pacemaker and underlying heart block we could not use the Bovie electrocautery.  I elected to make an elliptical incision in the skin overlying the area of radioactivity given the shallow nature of the seed.  This was done with a 15 blade knife.  The incision was carried through the skin sharply with a 15 blade knife.  Dissection was then carried around the radioactive seed using the harmonic scalpel while checking the area of radioactivity frequently.  Once the specimen was removed it was oriented with the appropriate paint colors.  A specimen radiograph was obtained that showed the clip and seed to be near the center of the specimen.   Because of the level of counts along the superior margin I elected to take an additional superior margin sharply with the harmonic scalpel and this was marked appropriately and also sent with the specimen to pathology for further evaluation.  Hemostasis was achieved using the harmonic scalpel.  The wound was then irrigated with saline and infiltrated with more quarter percent Marcaine.  The deep layer of the wound was then closed with layers of interrupted 3-0 Vicryl stitches.  The skin was closed with a running 4-0 Monocryl subcuticular stitch.  Dermabond dressings were applied.  The patient tolerated the procedure well.  At the end of the case all needle sponge and instrument counts were correct.  The patient was then awakened and taken to recovery in stable condition.  Of note there was 1 small white area along the inferior skin edge along the medial part of the incision that could have sustained some thermal injury that I was watching.  I will keep a close eye on this postoperatively.  PLAN OF CARE: Discharge to home after PACU  PATIENT DISPOSITION:  PACU - hemodynamically stable.   Delay start of Pharmacological VTE agent (>24hrs) due to surgical blood loss or risk of bleeding: not applicable

## 2020-05-26 NOTE — H&P (Signed)
[name]  Location: [facility] Patient #: [id number] DOB: 11/27/1940 Undefined / Language: Cleophus Molt / Race: White Female   History of Present Illness  The patient is a 80 year old female who presents with breast cancer.We are asked to see the patient in consultatiion by Dr. Jana Hakim to evaluate her for a new right breast cancer. The patient is a 80 year old white female who presents with a 34m mass in the LOQ of the right breast. the axilla looked neg. The mass was biopsied and came back as a grade 2 IDC that was ER and PR+ and Her2- with a Ki67 of 10%. She does have a sister with breast cancer. She has a history of HTN and a pacemaker and is followed by Dr. TLovena Le   Past Surgical History  Hysterectomy (not due to cancer) - Partial  Knee Surgery  Bilateral.  Diagnostic Studies History Colonoscopy  within last year Mammogram  within last year  Medication History Medications Reconciled  Social History  Alcohol use  Occasional alcohol use. Caffeine use  Coffee. No drug use  Tobacco use  Former smoker.  Family History Respiratory Condition  Mother. Seizure disorder  Sister.  Pregnancy / Birth History  Age at menarche  89years. Age of menopause  <45 Gravida  3 Length (months) of breastfeeding  7-12 Maternal age  80-30Para  3  Other Problems Breast Cancer  High blood pressure  Hypercholesterolemia  Thyroid Disease     Review of Systems  General Not Present- Appetite Loss, Chills, Fatigue, Fever, Night Sweats, Weight Gain and Weight Loss. Skin Not Present- Change in Wart/Mole, Dryness, Hives, Jaundice, New Lesions, Non-Healing Wounds, Rash and Ulcer. HEENT Present- Wears glasses/contact lenses. Not Present- Earache, Hearing Loss, Hoarseness, Nose Bleed, Oral Ulcers, Ringing in the Ears, Seasonal Allergies, Sinus Pain, Sore Throat, Visual Disturbances and Yellow Eyes. Respiratory Not Present- Bloody sputum, Chronic Cough,  Difficulty Breathing, Snoring and Wheezing. Breast Not Present- Breast Mass, Breast Pain, Nipple Discharge and Skin Changes. Cardiovascular Not Present- Chest Pain, Difficulty Breathing Lying Down, Leg Cramps, Palpitations, Rapid Heart Rate, Shortness of Breath and Swelling of Extremities. Gastrointestinal Not Present- Abdominal Pain, Bloating, Bloody Stool, Change in Bowel Habits, Chronic diarrhea, Constipation, Difficulty Swallowing, Excessive gas, Gets full quickly at meals, Hemorrhoids, Indigestion, Nausea, Rectal Pain and Vomiting. Female Genitourinary Not Present- Frequency, Nocturia, Painful Urination, Pelvic Pain and Urgency. Musculoskeletal Not Present- Back Pain, Joint Pain, Joint Stiffness, Muscle Pain, Muscle Weakness and Swelling of Extremities. Neurological Not Present- Decreased Memory, Fainting, Headaches, Numbness, Seizures, Tingling, Tremor, Trouble walking and Weakness. Psychiatric Not Present- Anxiety, Bipolar, Change in Sleep Pattern, Depression, Fearful and Frequent crying. Endocrine Not Present- Cold Intolerance, Excessive Hunger, Hair Changes, Heat Intolerance, Hot flashes and New Diabetes. Hematology Not Present- Blood Thinners, Easy Bruising, Excessive bleeding, Gland problems, HIV and Persistent Infections.   Physical Exam  General Mental Status-Alert. General Appearance-Consistent with stated age. Hydration-Well hydrated. Voice-Normal.  Head and Neck Head-normocephalic, atraumatic with no lesions or palpable masses. Trachea-midline. Thyroid Gland Characteristics - normal size and consistency.  Eye Eyeball - Bilateral-Extraocular movements intact. Sclera/Conjunctiva - Bilateral-No scleral icterus.  Chest and Lung Exam Chest and lung exam reveals -quiet, even and easy respiratory effort with no use of accessory muscles and on auscultation, normal breath sounds, no adventitious sounds and normal vocal resonance. Inspection Chest Wall -  Normal. Back - normal.  Breast Note: there is no palpable mass in either breast. there is no palpable axillary, supraclavicular, or cervical lymphadenopathy.  there is a pacemaker on the left chest wall   Cardiovascular Cardiovascular examination reveals -normal heart sounds, regular rate and rhythm with no murmurs and normal pedal pulses bilaterally.  Abdomen Inspection Inspection of the abdomen reveals - No Hernias. Skin - Scar - no surgical scars. Palpation/Percussion Palpation and Percussion of the abdomen reveal - Soft, Non Tender, No Rebound tenderness, No Rigidity (guarding) and No hepatosplenomegaly. Auscultation Auscultation of the abdomen reveals - Bowel sounds normal.  Neurologic Neurologic evaluation reveals -alert and oriented x 3 with no impairment of recent or remote memory. Mental Status-Normal.  Musculoskeletal Normal Exam - Left-Upper Extremity Strength Normal and Lower Extremity Strength Normal. Normal Exam - Right-Upper Extremity Strength Normal and Lower Extremity Strength Normal.  Lymphatic Head & Neck  General Head & Neck Lymphatics: Bilateral - Description - Normal. Axillary  General Axillary Region: Bilateral - Description - Normal. Tenderness - Non Tender. Femoral & Inguinal  Generalized Femoral & Inguinal Lymphatics: Bilateral - Description - Normal. Tenderness - Non Tender.    Assessment & Plan  MALIGNANT NEOPLASM OF LOWER-OUTER QUADRANT OF RIGHT BREAST OF FEMALE, ESTROGEN RECEPTOR POSITIVE (C50.511) Impression: The patient appears to have a small cancer in the LOQ of the right breast. I have discussed with her the different optiions for treatment and at this point she favors breast conservation which I feel is very reasoonable. I have discussed with her the risks and benefits of the surgery as well as some of the technical aspects including the use of a radioactive seed and she understands and wishes to proceed. She will not need a node  evaluation given her age and size of the cancer. She will need cardiac clearance since she has a pacemaker. She will also meet with medical and radiation oncology to discuss adjuvant therapy. This patient encounter took 60 minutes today to perform the following: take history, perform exam, review outside records, interpret imaging, counsel the patient on their diagnosis and document encounter, findings & plan in the EHR Current Plans Referred to Oncology, for evaluation and follow up (Oncology). Routine.

## 2020-05-26 NOTE — Interval H&P Note (Signed)
History and Physical Interval Note:  05/26/2020 10:08 AM  [name]  has presented today for surgery, with the diagnosis of RIGHT BREAST CANCER.  The various methods of treatment have been discussed with the patient and family. After consideration of risks, benefits and other options for treatment, the patient has consented to  Procedure(s): RIGHT BREAST LUMPECTOMY WITH RADIOACTIVE SEED LOCALIZATION (Right) as a surgical intervention.  The patient's history has been reviewed, patient examined, no change in status, stable for surgery.  I have reviewed the patient's chart and labs.  Questions were answered to the patient's satisfaction.     Autumn Messing III

## 2020-05-26 NOTE — Anesthesia Postprocedure Evaluation (Signed)
Anesthesia Post Note  Patient: [name]  Procedure(s) Performed: RIGHT BREAST LUMPECTOMY WITH RADIOACTIVE SEED LOCALIZATION (Right Breast)     Patient location during evaluation: PACU Anesthesia Type: General Level of consciousness: awake Pain management: pain level controlled Vital Signs Assessment: post-procedure vital signs reviewed and stable Respiratory status: spontaneous breathing Cardiovascular status: stable Postop Assessment: no apparent nausea or vomiting Anesthetic complications: no   No complications documented.  Last Vitals:  Vitals:   05/26/20 1200 05/26/20 1215  BP: (!) 119/56 (!) 115/50  Pulse: 81 76  Resp: (!) 22 12  Temp: (!) 35.8 C 36.4 C  SpO2: 96% 97%    Last Pain:  Vitals:   05/26/20 1215  TempSrc:   PainSc: 2                  Huston Foley

## 2020-05-26 NOTE — Anesthesia Preprocedure Evaluation (Signed)
Anesthesia Evaluation  Patient identified by MRN, date of birth, ID band Patient awake    Reviewed: Allergy & Precautions, NPO status , Patient's Chart, lab work & pertinent test results  Airway Mallampati: II  TM Distance: >3 FB Neck ROM: Full    Dental no notable dental hx.    Pulmonary former smoker,    breath sounds clear to auscultation       Cardiovascular hypertension, Pt. on medications + dysrhythmias + pacemaker  Rhythm:Regular Rate:Normal     Neuro/Psych negative neurological ROS  negative psych ROS   GI/Hepatic negative GI ROS, Neg liver ROS,   Endo/Other  negative endocrine ROSHypothyroidism   Renal/GU Renal InsufficiencyRenal disease  negative genitourinary   Musculoskeletal  (+) Arthritis ,   Abdominal (+) + obese,   Peds  Hematology negative hematology ROS (+)   Anesthesia Other Findings   Reproductive/Obstetrics                             Lab Results  Component Value Date   WBC 7.4 04/27/2020   HGB 14.0 04/27/2020   HCT 42.1 04/27/2020   MCV 89.0 04/27/2020   PLT 248 04/27/2020   Lab Results  Component Value Date   CREATININE 1.15 (H) 05/23/2020   BUN 18 05/23/2020   NA 137 05/23/2020   K 4.4 05/23/2020   CL 102 05/23/2020   CO2 27 05/23/2020   Lab Results  Component Value Date   INR 1.0 04/18/2016   INR 1.11 03/07/2015   INR 0.98 10/27/2010    Anesthesia Physical  Anesthesia Plan  ASA: III  Anesthesia Plan: General   Post-op Pain Management:    Induction: Intravenous  PONV Risk Score and Plan: 4 or greater and Ondansetron and Treatment may vary due to age or medical condition  Airway Management Planned: LMA  Additional Equipment: None  Intra-op Plan:   Post-operative Plan: Extubation in OR  Informed Consent: I have reviewed the patients History and Physical, chart, labs and discussed the procedure including the risks, benefits and  alternatives for the proposed anesthesia with the patient or authorized representative who has indicated his/her understanding and acceptance.     Dental advisory given  Plan Discussed with: CRNA  Anesthesia Plan Comments: (    Remote device check reviewed. Histograms appropriate. Leads and battery stable for patient. Follow up as outlined above. No recommended changes.   )        Anesthesia Quick Evaluation

## 2020-05-26 NOTE — Discharge Instructions (Signed)
No tylenol until after 2:38pm today. No ibuprofen until after 4:38pm today.    Post Anesthesia Home Care Instructions  Activity: Get plenty of rest for the remainder of the day. A responsible individual must stay with you for 24 hours following the procedure.  For the next 24 hours, DO NOT: -Drive a car -Paediatric nurse -Drink alcoholic beverages -Take any medication unless instructed by your physician -Make any legal decisions or sign important papers.  Meals: Start with liquid foods such as gelatin or soup. Progress to regular foods as tolerated. Avoid greasy, spicy, heavy foods. If nausea and/or vomiting occur, drink only clear liquids until the nausea and/or vomiting subsides. Call your physician if vomiting continues.  Special Instructions/Symptoms: Your throat may feel dry or sore from the anesthesia or the breathing tube placed in your throat during surgery. If this causes discomfort, gargle with warm salt water. The discomfort should disappear within 24 hours.  If you had a scopolamine patch placed behind your ear for the management of post- operative nausea and/or vomiting:  1. The medication in the patch is effective for 72 hours, after which it should be removed.  Wrap patch in a tissue and discard in the trash. Wash hands thoroughly with soap and water. 2. You may remove the patch earlier than 72 hours if you experience unpleasant side effects which may include dry mouth, dizziness or visual disturbances. 3. Avoid touching the patch. Wash your hands with soap and water after contact with the patch.

## 2020-05-26 NOTE — Transfer of Care (Signed)
Immediate Anesthesia Transfer of Care Note  Patient: [name]  Procedure(s) Performed: RIGHT BREAST LUMPECTOMY WITH RADIOACTIVE SEED LOCALIZATION (Right Breast)  Patient Location: PACU  Anesthesia Type:General  Level of Consciousness: awake  Airway & Oxygen Therapy: Patient Spontanous Breathing and Patient connected to face mask oxygen  Post-op Assessment: Report given to RN and Post -op Vital signs reviewed and stable  Post vital signs: Reviewed and stable  Last Vitals:  Vitals Value Taken Time  BP 108/57 05/26/20 1148  Temp    Pulse 92 05/26/20 1149  Resp    SpO2 97 % 05/26/20 1149  Vitals shown include unvalidated device data.  Last Pain:  Vitals:   05/26/20 0834  TempSrc: Oral  PainSc: 0-No pain         Complications: No complications documented.

## 2020-05-27 NOTE — Addendum Note (Signed)
Addendum  created 05/27/20 1610 by Nijel Flink, Ernesta Amble, CRNA   Charge Capture section accepted

## 2020-05-30 ENCOUNTER — Encounter (HOSPITAL_BASED_OUTPATIENT_CLINIC_OR_DEPARTMENT_OTHER): Payer: Self-pay | Admitting: General Surgery

## 2020-05-30 LAB — SURGICAL PATHOLOGY

## 2020-05-31 ENCOUNTER — Encounter: Payer: Self-pay | Admitting: *Deleted

## 2020-06-07 ENCOUNTER — Ambulatory Visit (INDEPENDENT_AMBULATORY_CARE_PROVIDER_SITE_OTHER): Payer: PPO

## 2020-06-07 DIAGNOSIS — I442 Atrioventricular block, complete: Secondary | ICD-10-CM

## 2020-06-08 LAB — CUP PACEART REMOTE DEVICE CHECK
Battery Remaining Longevity: 132 mo
Battery Remaining Percentage: 100 %
Brady Statistic RA Percent Paced: 11 %
Brady Statistic RV Percent Paced: 100 %
Date Time Interrogation Session: 20220531050100
Implantable Lead Implant Date: 20070626
Implantable Lead Implant Date: 20070626
Implantable Lead Location: 753859
Implantable Lead Location: 753860
Implantable Lead Model: 4456
Implantable Lead Model: 4469
Implantable Lead Serial Number: 452570
Implantable Lead Serial Number: 480762
Implantable Pulse Generator Implant Date: 20180416
Lead Channel Impedance Value: 344 Ohm
Lead Channel Impedance Value: 428 Ohm
Lead Channel Pacing Threshold Amplitude: 0.5 V
Lead Channel Pacing Threshold Amplitude: 1 V
Lead Channel Pacing Threshold Pulse Width: 0.4 ms
Lead Channel Pacing Threshold Pulse Width: 0.4 ms
Lead Channel Setting Pacing Amplitude: 1.3 V
Lead Channel Setting Pacing Amplitude: 2 V
Lead Channel Setting Pacing Pulse Width: 0.4 ms
Lead Channel Setting Sensing Sensitivity: 2.5 mV
Pulse Gen Serial Number: 782681

## 2020-06-16 ENCOUNTER — Telehealth: Payer: Self-pay | Admitting: Genetic Counselor

## 2020-06-16 NOTE — Telephone Encounter (Signed)
Discussed with [name] that we have a letter of approval from [employer] for her genetic testing. Confirmed that the denial letter she received was for the same CPT code that we have an approval for [phone]). We will reach out to the genetic testing laboratory [facility]) to clarify the approval and [name] expected OOP cost for testing. We will call [name] once we have more information.

## 2020-06-16 NOTE — Telephone Encounter (Signed)
LVM requesting that [name] call back to discuss the genetics insurance denial that she received.

## 2020-06-30 NOTE — Progress Notes (Signed)
Remote pacemaker transmission.   

## 2020-07-04 NOTE — Progress Notes (Signed)
[place] [facility]  [phone] [phone]     ID: [name] DOB: 12-30-1940  MR#: [id number]  XBM#:[id number]  Patient Care Team: Mayra Neer, MD as PCP - General (Family Medicine) Evans Lance, MD as PCP - Electrophysiology (Cardiology) Rockwell Germany, RN as Oncology Nurse Navigator Mauro Kaufmann, RN as Oncology Nurse Navigator Jovita Kussmaul, MD as Consulting Physician (General Surgery) Clothilde Tippetts, Virgie Dad, MD as Consulting Physician (Oncology) Eppie Gibson, MD as Attending Physician (Radiation Oncology) Ladene Artist, MD as Consulting Physician (Gastroenterology) Chauncey Cruel, MD OTHER MD:  CHIEF COMPLAINT: Estrogen receptor positive breast cancer  CURRENT TREATMENT: Tamoxifen   INT did remarkably well with her surgery ERVAL HISTORY: [name] returns today for follow up of her estrogen receptor positive breast cancer. She was evaluated in the multidisciplinary breast cancer clinic on 04/27/2020.  She underwent right lumpectomy on 05/26/2020 under Dr. Marlou Starks. Pathology from the procedure [id number]) showed: invasive ductal carcinoma, grade 2, 0.8 cm; ductal carcinoma in situ, intermediate grade; margins uninvolved.  When her case was presented in the multidisciplinary breast cancer conference 04/27/2020 it was felt that she took antiestrogens she should be able to bypass adjuvant radiation.  REVIEW OF SYSTEMS: [name] did remarkably well with her surgery.  She feels she is pretty much back to baseline, doing a little bit of walking, planting some trees in her yard, going to [place] the second week in [place].  Detailed review of systems today was otherwise stable   COVID 19 VACCINATION STATUS: Moderna x2, no booster as of 04/27/2020; had COVID fall 2021   HISTORY OF CURRENT ILLNESS: From the original intake note:  [name] had routine screening mammography on 03/28/2020 showing a possible abnormality in the  right breast. She underwent right diagnostic mammography with tomography and right breast ultrasonography [facility] on 04/07/2020 showing: breast density category A; 0.5 cm round mass in right breast at 8 o'clock; no abnormal nodes in right axilla.  Accordingly on 04/20/2020 she proceeded to biopsy of the right breast area in question. The pathology from this procedure ([id number]) showed: invasive ductal carcinoma, grade 2; ductal carcinoma in situ. Prognostic indicators significant for: estrogen receptor, >95% positive and progesterone receptor, >95% positive, both with strong staining intensity. Proliferation marker Ki67 at 10%. HER2 equivocal by immunohistochemistry (2+), but negative by fluorescent in situ hybridization with a signals ratio 1.32 and number per cell 1.85.  Cancer Staging Malignant neoplasm of lower-outer quadrant of right breast of female, estrogen receptor positive ([place]) Staging form: Breast, AJCC 8th Edition - Clinical stage from 04/27/2020: Stage IA (cT1a, cN0, cM0, G2, ER+, PR+, HER2-) - Signed by Chauncey Cruel, MD on 04/27/2020 Stage prefix: Initial diagnosis Histologic grading system: 3 grade system  The patient's subsequent history is as detailed below.   PAST MEDICAL HISTORY: Past Medical History:  Diagnosis Date   Arthritis    Atrioventricular block, complete ([place])    Breast cancer ([place]) 04/2020   right breast IDC   Cardiac pacemaker in situ    CHB (complete heart block) ([place]) 2007   Essential hypertension, benign    Family history of breast cancer    Family history of kidney cancer    Family history of lung cancer    Family history of melanoma    Family history of prostate cancer    Family history of thyroid cancer    Frequency of urination    History of skin cancer    Hyperlipidemia  Hyperlipidemia    Hypothyroidism    Malignant melanoma ([place]) 03/12/2016   LEFT KNEE- TX WITH EXCISION   Postmenopausal    SCC (squamous cell carcinoma) 10/27/2014    SCC/KA-LEFT SHIN- TX CURET X 3,5FU   Thyroid disease    hypothyroid    PAST SURGICAL HISTORY: Past Surgical History:  Procedure Laterality Date   ABDOMINAL HYSTERECTOMY     BLADDER REPAIR     BREAST LUMPECTOMY WITH RADIOACTIVE SEED LOCALIZATION Right 05/26/2020   Procedure: RIGHT BREAST LUMPECTOMY WITH RADIOACTIVE SEED LOCALIZATION;  Surgeon: Jovita Kussmaul, MD;  Location: [facility];  Service: General;  Laterality: Right;   COLONOSCOPY     KNEE ARTHROSCOPY     rt   PACEMAKER INSERTION  07-03-2005   [employer] Guidant    PPM GENERATOR [place] N/A 04/23/2016   Procedure: PPM Generator Changeout;  Surgeon: Evans Lance, MD;  Location: [facility];  Service: Cardiovascular;  Laterality: N/A;   TOTAL KNEE ARTHROPLASTY     right   TOTAL KNEE ARTHROPLASTY Left 03/14/2015   Procedure: TOTAL KNEE ARTHROPLASTY;  Surgeon: Gaynelle Arabian, MD;  Location: [facility];  Service: Orthopedics;  Laterality: Left;    FAMILY HISTORY: Family History  Problem Relation Age of Onset   Lung cancer Mother 72       smoker   Heart attack Father        pacemaker & defibrillator   Breast cancer Sister 31   Prostate cancer Brother 56   Kidney cancer Sister 76   Melanoma Son 37   Thyroid cancer Niece        dx 78s   Colon cancer Neg Hx    Esophageal cancer Neg Hx    Stomach cancer Neg Hx    Rectal cancer Neg Hx    Her father died at age 38 from [place]. Her mother died at age 4 from lung cancer, diagnosed at age 21. She was a smoker. [name] has 3 brothers and 4 sisters. Aside from her mother, she reports breast cancer in a sister at age 34 and prostate cancer in a brother.   GYNECOLOGIC HISTORY:  No LMP recorded. Patient has had a hysterectomy. Menarche: 80 years old Age at first live birth: 80 years old [place] P 3 LMP age 7, with hysterectomy HRT took for 10 years hollowing hysterectomy  Hysterectomy? yes BSO? Only one ovary taken   SOCIAL HISTORY: (updated 04/2020)   [name] is currently retired from working as a Scientist, clinical (histocompatibility and immunogenetics) at [employer]. She is widowed. She lives at home by herself. Son [name], age 65, works in Architect in [place], [place]. Son [name], age 19, owns a landscape business here in [place]. Daughter [name], age 36, if a Government social research officer here in [place]. [name] has 9 grandchildren (no "greats"). She attends [employer]    ADVANCED DIRECTIVES: in place, son [name] is her 61 and can be reached at [phone]   HEALTH MAINTENANCE: Social History   Tobacco Use   Smoking status: Former    Pack years: 0.00    Types: Cigarettes    Quit date: 03/06/1986    Years since quitting: 34.3   Smokeless tobacco: Never  Vaping Use   Vaping Use: Never used  Substance Use Topics   Alcohol use: Yes    Comment: wine socially   Drug use: No     Colonoscopy: 01/2020 (Dr. Fuller Plan), repeat not indicated due to age  PAP: date unknown  Bone density: none on file  Allergies  Allergen Reactions   Lipitor [Atorvastatin] Other (See Comments)    Muscle aches    Current Outpatient Medications  Medication Sig Dispense Refill   tamoxifen (NOLVADEX) 20 MG tablet Take 1 tablet (20 mg total) by mouth daily. 90 tablet 4   amLODipine (NORVASC) 5 MG tablet Take 5 mg by mouth Daily.      aspirin EC 81 MG tablet Take 81 mg by mouth daily.     Cholecalciferol (VITAMIN D3 PO) Take 2,000 Units by mouth daily.     levothyroxine (SYNTHROID, LEVOTHROID) 100 MCG tablet Take 100 mcg by mouth daily.     lisinopril-hydrochlorothiazide (PRINZIDE,ZESTORETIC) 20-25 MG per tablet Take 1 tablet by mouth Daily.     pravastatin (PRAVACHOL) 40 MG tablet Take 40 mg by mouth Daily.      No current facility-administered medications for this visit.    OBJECTIVE: White woman in no acute distress  Vitals:   07/05/20 1310  BP: 116/60  Pulse: 78  Resp: 17  Temp: 97.8 F (36.6 C)  SpO2: 100%      Body mass index is 32.43 kg/m.   Wt Readings from Last 3  Encounters:  07/05/20 183 lb 1.6 oz (83.1 kg)  05/26/20 184 lb 1.4 oz (83.5 kg)  04/27/20 183 lb 4.8 oz (83.1 kg)      ECOG FS:1 - Symptomatic but completely ambulatory  Sclerae unicteric, EOMs intact Wearing a mask No cervical or supraclavicular adenopathy Lungs no rales or rhonchi Heart regular rate and rhythm Abd soft, nontender, positive bowel sounds MSK no focal spinal tenderness, no upper extremity lymphedema Neuro: nonfocal, well oriented, appropriate affect Breasts: The right breast is status post lumpectomy.  The cosmetic result is excellent.  There is no evidence of residual or recurrent disease.  Left breast and both axillae are benign   LAB RESULTS:  CMP     Component Value Date/Time   NA 137 05/23/2020 1005   NA 140 04/18/2016 1035   K 4.4 05/23/2020 1005   CL 102 05/23/2020 1005   CO2 27 05/23/2020 1005   GLUCOSE 88 05/23/2020 1005   BUN 18 05/23/2020 1005   BUN 25 04/18/2016 1035   CREATININE 1.15 (H) 05/23/2020 1005   CREATININE 1.15 (H) 04/27/2020 1210   CALCIUM 9.5 05/23/2020 1005   PROT 7.8 04/27/2020 1210   ALBUMIN 4.2 04/27/2020 1210   AST 15 04/27/2020 1210   ALT 11 04/27/2020 1210   ALKPHOS 68 04/27/2020 1210   BILITOT 0.5 04/27/2020 1210   GFRNONAA 48 (L) 05/23/2020 1005   GFRNONAA 48 (L) 04/27/2020 1210   GFRAA 65 04/18/2016 1035    No results found for: TOTALPROTELP, ALBUMINELP, A1GS, A2GS, BETS, BETA2SER, GAMS, MSPIKE, SPEI  Lab Results  Component Value Date   WBC 7.4 04/27/2020   NEUTROABS 4.2 04/27/2020   HGB 14.0 04/27/2020   HCT 42.1 04/27/2020   MCV 89.0 04/27/2020   PLT 248 04/27/2020    No results found for: LABCA2  No components found for: [id number]  No results for input(s): INR in the last 168 hours.  No results found for: LABCA2  No results found for: LOV564  No results found for: PPI951  No results found for: OAC166  No results found for: CA2729  No components found for: HGQUANT  No results found for:  CEA1 / No results found for: CEA1   No results found for: AFPTUMOR  No results found for: CHROMOGRNA  No results found for: KPAFRELGTCHN, LAMBDASER, KAPLAMBRATIO (kappa/lambda  light chains)  No results found for: HGBA, HGBA2QUANT, HGBFQUANT, HGBSQUAN (Hemoglobinopathy evaluation)   No results found for: LDH  No results found for: IRON, TIBC, IRONPCTSAT (Iron and TIBC)  No results found for: FERRITIN  Urinalysis    Component Value Date/Time   COLORURINE YELLOW 03/07/2015 [place] 03/07/2015 1033   LABSPEC 1.006 03/07/2015 1033   PHURINE 7.0 03/07/2015 1033   GLUCOSEU NEGATIVE 03/07/2015 1033   HGBUR NEGATIVE 03/07/2015 1033   BILIRUBINUR NEGATIVE 03/07/2015 1033   KETONESUR NEGATIVE 03/07/2015 1033   PROTEINUR NEGATIVE 03/07/2015 1033   UROBILINOGEN 0.2 10/27/2010 0945   NITRITE NEGATIVE 03/07/2015 1033   LEUKOCYTESUR TRACE (A) 03/07/2015 1033    STUDIES: CUP PACEART REMOTE DEVICE CHECK  Result Date: 06/08/2020 Scheduled remote reviewed. Normal device function.  Next remote 91 days. HB    ELIGIBLE FOR AVAILABLE RESEARCH PROTOCOL: no  ASSESSMENT: 80 y.o. [place] woman status post right breast lower outer quadrant biopsy 04/20/2020 for a clinical T1a N0, stage IA invasive ductal carcinoma, grade 2, estrogen and progesterone receptor positive, HER2 not amplified, with an MIB-1 of 10%.  (1) genetics testing 05/08/2020 through the [facility] panel found no deleterious mutations in AIP, ALK, APC, ATM, AXIN2, BAP1, BARD1, BLM, BMPR1A, BRCA1, BRCA2, BRIP1, CDC73, CDH1, CDK4, CDKN1B, CDKN2A, CHEK2, CTNNA1, DICER1, FANCC, FH, FLCN, GALNT12, KIF1B, LZTR1, MAX, MEN1, MET, MLH1, MSH2, MSH3, MSH6, MUTYH, NBN, NF1, NF2, NTHL1, PALB2, PHOX2B, PMS2, POT1, PRKAR1A, PTCH1, PTEN, RAD51C, RAD51D, RB1, RECQL, RET, SDHA, SDHAF2, SDHB, SDHC, SDHD, SMAD4, SMARCA4, SMARCB1, SMARCE1, STK11, SUFU, TMEM127, TP53, TSC1, TSC2, VHL and XRCC2 (sequencing and  deletion/duplication); EGFR, EGLN1, HOXB13, KIT, MITF, PDGFRA, POLD1 and POLE (sequencing only); EPCAM and GREM1 (deletion/duplication only). RNA data is routinely analyzed for use in variant interpretation for all genes.  (2) right lumpectomy 05/26/2020 showed a pT1b pNX, stage IA invasive ductal carcinoma, grade 2, with negative margins  (3) likely no adjuvant radiation  (4) tamoxifen started 07/05/2020  (A) bone density  (B) s/p hysterectomy  (C) s/p hormone replacement >88 years w/o complications   PLAN: [name] did remarkably well with her surgery.  We had previously discussed that if she does take antiestrogens for 5 years she will be able to bypass radiation and that is the plan.  We discussed the difference between tamoxifen and anastrozole in detail. She understands that anastrozole and the aromatase inhibitors in general work by blocking estrogen production. Accordingly vaginal dryness, decrease in bone density, and of course hot flashes can result. The aromatase inhibitors can also negatively affect the cholesterol profile, although that is a minor effect. One out of 5 women on aromatase inhibitors we will feel "old and achy". This arthralgia/myalgia syndrome, which resembles fibromyalgia clinically, does resolve with stopping the medications. Accordingly this is not a reason to not try an aromatase inhibitor but it is a frequent reason to stop it (in other words 20% of women will not be able to tolerate these medications).  Tamoxifen on the other hand does not block estrogen production. It does not "take away a woman's estrogen". It blocks the estrogen receptor in breast cells. Like anastrozole, it can also cause hot flashes. As opposed to anastrozole, tamoxifen has many estrogen-like effects. It is technically an estrogen receptor modulator. This means that in some tissues tamoxifen works like estrogen-- for example it helps strengthen the bones. It tends to improve the cholesterol  profile. It can cause thickening of the endometrial lining, and even endometrial polyps or  rarely cancer of the uterus.(The risk of uterine cancer due to tamoxifen is one additional cancer per thousand women year). It can cause vaginal wetness or stickiness. It can cause blood clots through this estrogen-like effect--the risk of blood clots with tamoxifen is exactly the same as with birth control pills or hormone replacement.  Neither of these agents causes mood changes or weight gain, despite the popular belief that they can have these side effects. We have data from studies comparing either of these drugs with placebo, and in those cases the control group had the same amount of weight gain and depression as the group that took the drug.  Given the fact that she is status post hysterectomy and that she tolerated estrogen products for many years with no complications I think tamoxifen would be a particularly good choice for her.  We are starting it today.  We will do a virtual visit in about 6weeks just to make sure she is tolerating it well and if so then I will set her up for mammography sometime in October with a follow-up visit then  I encouraged her to call us with any concerns or symptoms that may develop.  Total encounter time 25 minutes.Sarajane Jews C. Anamari Galeas, MD 07/05/2020 1:50 PM Medical Oncology and Hematology [facility] [place], [place] [address] Tel. [phone]    Fax. [phone]   This document serves as a record of services personally performed by Lurline Del, MD. It was created on his behalf by Wilburn Mylar, a trained medical scribe. The creation of this record is based on the scribe's personal observations and the provider's statements to them.   I, Lurline Del MD, have reviewed the above documentation for accuracy and completeness, and I agree with the above.   *Total Encounter Time as defined by the Centers for Medicare and  Medicaid Services includes, in addition to the face-to-face time of a patient visit (documented in the note above) non-face-to-face time: obtaining and reviewing outside history, ordering and reviewing medications, tests or procedures, care coordination (communications with other health care professionals or caregivers) and documentation in the medical record.

## 2020-07-05 ENCOUNTER — Other Ambulatory Visit: Payer: Self-pay

## 2020-07-05 ENCOUNTER — Inpatient Hospital Stay: Payer: PPO | Attending: Oncology | Admitting: Oncology

## 2020-07-05 VITALS — BP 116/60 | HR 78 | Temp 97.8°F | Resp 17 | Ht 63.0 in | Wt 183.1 lb

## 2020-07-05 DIAGNOSIS — I10 Essential (primary) hypertension: Secondary | ICD-10-CM | POA: Insufficient documentation

## 2020-07-05 DIAGNOSIS — Z85820 Personal history of malignant melanoma of skin: Secondary | ICD-10-CM | POA: Insufficient documentation

## 2020-07-05 DIAGNOSIS — E782 Mixed hyperlipidemia: Secondary | ICD-10-CM | POA: Diagnosis not present

## 2020-07-05 DIAGNOSIS — C50911 Malignant neoplasm of unspecified site of right female breast: Secondary | ICD-10-CM | POA: Diagnosis not present

## 2020-07-05 DIAGNOSIS — C50511 Malignant neoplasm of lower-outer quadrant of right female breast: Secondary | ICD-10-CM | POA: Insufficient documentation

## 2020-07-05 DIAGNOSIS — Z7982 Long term (current) use of aspirin: Secondary | ICD-10-CM | POA: Diagnosis not present

## 2020-07-05 DIAGNOSIS — E559 Vitamin D deficiency, unspecified: Secondary | ICD-10-CM | POA: Diagnosis not present

## 2020-07-05 DIAGNOSIS — E669 Obesity, unspecified: Secondary | ICD-10-CM | POA: Diagnosis not present

## 2020-07-05 DIAGNOSIS — Z79899 Other long term (current) drug therapy: Secondary | ICD-10-CM | POA: Diagnosis not present

## 2020-07-05 DIAGNOSIS — E039 Hypothyroidism, unspecified: Secondary | ICD-10-CM | POA: Diagnosis not present

## 2020-07-05 DIAGNOSIS — Z170 Estrogen receptor positive status [ER+]: Secondary | ICD-10-CM

## 2020-07-05 DIAGNOSIS — F32A Depression, unspecified: Secondary | ICD-10-CM | POA: Diagnosis not present

## 2020-07-05 DIAGNOSIS — E785 Hyperlipidemia, unspecified: Secondary | ICD-10-CM | POA: Diagnosis not present

## 2020-07-05 DIAGNOSIS — Z0000 Encounter for general adult medical examination without abnormal findings: Secondary | ICD-10-CM | POA: Diagnosis not present

## 2020-07-05 DIAGNOSIS — Z79811 Long term (current) use of aromatase inhibitors: Secondary | ICD-10-CM | POA: Diagnosis not present

## 2020-07-05 DIAGNOSIS — Z950 Presence of cardiac pacemaker: Secondary | ICD-10-CM | POA: Diagnosis not present

## 2020-07-05 DIAGNOSIS — M1990 Unspecified osteoarthritis, unspecified site: Secondary | ICD-10-CM | POA: Diagnosis not present

## 2020-07-05 MED ORDER — TAMOXIFEN CITRATE 20 MG PO TABS: 20.0000 mg | ORAL_TABLET | Freq: Every day | ORAL | 4 refills | Status: AC

## 2020-07-07 ENCOUNTER — Encounter: Payer: Self-pay | Admitting: *Deleted

## 2020-08-15 NOTE — Progress Notes (Signed)
[name]  [phone] Fax:([phone]) [phone]     ID: [name] DOB: 1940/02/11  MR#: [id number]  GEX#:[id number]  Patient Care Team: Mayra Neer, MD as PCP - General (Family Medicine) Evans Lance, MD as PCP - Electrophysiology (Cardiology) Rockwell Germany, RN as Oncology Nurse Navigator Mauro Kaufmann, RN as Oncology Nurse Navigator Jovita Kussmaul, MD as Consulting Physician (General Surgery) Tyshan Enderle, Virgie Dad, MD as Consulting Physician (Oncology) Eppie Gibson, MD as Attending Physician (Radiation Oncology) Ladene Artist, MD as Consulting Physician (Gastroenterology) Chauncey Cruel, MD OTHER MD:    CHIEF COMPLAINT: Estrogen receptor positive breast cancer  CURRENT TREATMENT: Tamoxifen   INTERVAL HISTORY: [name] was scheduled for a virtual visit today for follow up of her estrogen receptor positive breast cancer.  However she came in person.--She tells me she just signed up for MyChart today that may have been the reason for the confusion  She was started on tamoxifen at her last visit on 07/05/2020.  She is having some hot flashes, which is of course a very common side effect.  She thinks her appetite is down a little bit.  I specifically asked her if she felt felt she was more forgetful and she really does not think so.  She is walking a mile most days with her neighbor which is something she did before.  Quite aside from her report her daughter [name] called [place] concerned that her mother seemed to be "just not herself", might be depressed, of course was having problems with hot flashes and she wondered how much of that could be the tamoxifen.   REVIEW OF SYSTEMS: Otherwise than as reported above a detailed review of systems today was benign  COVID 19 VACCINATION STATUS: Moderna x2, no booster as of 04/27/2020; had COVID fall 2021   HISTORY OF CURRENT ILLNESS: From the original intake note:  [name] had routine  screening mammography on 03/28/2020 showing a possible abnormality in the right breast. She underwent right diagnostic mammography with tomography and right breast ultrasonography at [facility] on 04/07/2020 showing: breast density category A; 0.5 cm round mass in right breast at 8 o'clock; no abnormal nodes in right axilla.  Accordingly on 04/20/2020 she proceeded to biopsy of the right breast area in question. The pathology from this procedure ([id number]) showed: invasive ductal carcinoma, grade 2; ductal carcinoma in situ. Prognostic indicators significant for: estrogen receptor, >95% positive and progesterone receptor, >95% positive, both with strong staining intensity. Proliferation marker Ki67 at 10%. HER2 equivocal by immunohistochemistry (2+), but negative by fluorescent in situ hybridization with a signals ratio 1.32 and number per cell 1.85.  Cancer Staging Malignant neoplasm of lower-outer quadrant of right breast of female, estrogen receptor positive ([place]) Staging form: Breast, AJCC 8th Edition - Clinical stage from 04/27/2020: Stage IA (cT1a, cN0, cM0, G2, ER+, PR+, HER2-) - Signed by Chauncey Cruel, MD on 04/27/2020 Stage prefix: Initial diagnosis Histologic grading system: 3 grade system  The patient's subsequent history is as detailed below.   PAST MEDICAL HISTORY: Past Medical History:  Diagnosis Date   Arthritis    Atrioventricular block, complete ([place])    Breast cancer ([place]) 04/2020   right breast IDC   Cardiac pacemaker in situ    CHB (complete heart block) ([place]) 2007   Essential hypertension, benign    Family history of breast cancer    Family history of kidney cancer    Family history of lung cancer  Family history of melanoma    Family history of prostate cancer    Family history of thyroid cancer    Frequency of urination    History of skin cancer    Hyperlipidemia    Hyperlipidemia    Hypothyroidism    Malignant melanoma ([place]) 03/12/2016   LEFT KNEE- [place] WITH  EXCISION   Postmenopausal    SCC (squamous cell carcinoma) 10/27/2014   SCC/KA-LEFT SHIN- TX CURET X 3,5FU   Thyroid disease    hypothyroid    PAST SURGICAL HISTORY: Past Surgical History:  Procedure Laterality Date   ABDOMINAL HYSTERECTOMY     BLADDER REPAIR     BREAST LUMPECTOMY WITH RADIOACTIVE SEED LOCALIZATION Right 05/26/2020   Procedure: RIGHT BREAST LUMPECTOMY WITH RADIOACTIVE SEED LOCALIZATION;  Surgeon: Jovita Kussmaul, MD;  Location: [facility];  Service: General;  Laterality: Right;   COLONOSCOPY     KNEE ARTHROSCOPY     rt   PACEMAKER INSERTION  07-03-2005   [employer] Guidant    PPM GENERATOR [place] N/A 04/23/2016   Procedure: PPM Generator Changeout;  Surgeon: Evans Lance, MD;  Location: [facility];  Service: Cardiovascular;  Laterality: N/A;   TOTAL KNEE ARTHROPLASTY     right   TOTAL KNEE ARTHROPLASTY Left 03/14/2015   Procedure: TOTAL KNEE ARTHROPLASTY;  Surgeon: Gaynelle Arabian, MD;  Location: [facility];  Service: Orthopedics;  Laterality: Left;    FAMILY HISTORY: Family History  Problem Relation Age of Onset   Lung cancer Mother 88       smoker   Heart attack Father        pacemaker & defibrillator   Breast cancer Sister 61   Prostate cancer Brother 46   Kidney cancer Sister 60   Melanoma Son 69   Thyroid cancer Niece        dx 92s   Colon cancer Neg Hx    Esophageal cancer Neg Hx    Stomach cancer Neg Hx    Rectal cancer Neg Hx    Her father died at age 26 from [place]. Her mother died at age 4 from lung cancer, diagnosed at age 17. She was a smoker. [place] has 3 brothers and 4 sisters. Aside from her mother, she reports breast cancer in a sister at age 95 and prostate cancer in a brother.   GYNECOLOGIC HISTORY:  No LMP recorded. Patient has had a hysterectomy. Menarche: 80 years old Age at first live birth: 80 years old [place] P 3 LMP age 33, with hysterectomy HRT took for 10 years hollowing hysterectomy  Hysterectomy?  yes BSO? Only one ovary taken   SOCIAL HISTORY: (updated 04/2020)  [name] is currently retired from working as a Scientist, clinical (histocompatibility and immunogenetics) at [employer]. She is widowed. She lives at home by herself. Son Gene, age 37, works in Architect in [place], [place]. Son Elta Gu[place]oupe, age 75, owns a landscape business here in [place]. Daughter [name], age 71, if a Government social research officer here in [place]. [place] has 9 grandchildren (no "greats"). She attends [employer]    ADVANCED DIRECTIVES: in place, son Gene is her 34 and can be reached at [phone]   HEALTH MAINTENANCE: Social History   Tobacco Use   Smoking status: Former    Types: Cigarettes    Quit date: 03/06/1986    Years since quitting: 34.4   Smokeless tobacco: Never  Vaping Use   Vaping Use: Never used  Substance Use Topics   Alcohol use: Yes  Comment: wine socially   Drug use: No     Colonoscopy: 01/2020 (Dr. Fuller Plan), repeat not indicated due to age  PAP: date unknown  Bone density: none on file   Allergies  Allergen Reactions   Lipitor [Atorvastatin] Other (See Comments)    Muscle aches    Current Outpatient Medications  Medication Sig Dispense Refill   amLODipine (NORVASC) 5 MG tablet Take 5 mg by mouth Daily.      aspirin EC 81 MG tablet Take 81 mg by mouth daily.     Cholecalciferol (VITAMIN D3 PO) Take 2,000 Units by mouth daily.     levothyroxine (SYNTHROID, LEVOTHROID) 100 MCG tablet Take 100 mcg by mouth daily.     lisinopril-hydrochlorothiazide (PRINZIDE,ZESTORETIC) 20-25 MG per tablet Take 1 tablet by mouth Daily.     pravastatin (PRAVACHOL) 40 MG tablet Take 40 mg by mouth Daily.      No current facility-administered medications for this visit.    OBJECTIVE: White woman who appears stated age  55:   08/16/20 1008  BP: (!) 110/51  Pulse: 76  Resp: 18  Temp: 97.7 F (36.5 C)  SpO2: 100%       Body mass index is 31.23 kg/m.   Wt Readings from Last 3 Encounters:  08/16/20 176 lb 4.8 oz (80 kg)   07/05/20 183 lb 1.6 oz (83.1 kg)  05/26/20 184 lb 1.4 oz (83.5 kg)      ECOG FS:1 - Symptomatic but completely ambulatory  Sclerae unicteric, EOMs intact Oropharynx clear and moist No cervical or supraclavicular adenopathy Lungs no rales or rhonchi Heart regular rate and rhythm Abd soft, nontender, positive bowel sounds MSK no focal spinal tenderness, no upper extremity lymphedema Neuro: nonfocal, well oriented, appropriate affect, no depressive symptoms Breasts: The right breast is status post lumpectomy.  The cosmetic result is excellent.  The left breast and both axillae are benign.   LAB RESULTS:  CMP     Component Value Date/Time   NA 137 05/23/2020 1005   NA 140 04/18/2016 1035   K 4.4 05/23/2020 1005   CL 102 05/23/2020 1005   CO2 27 05/23/2020 1005   GLUCOSE 88 05/23/2020 1005   BUN 18 05/23/2020 1005   BUN 25 04/18/2016 1035   CREATININE 1.15 (H) 05/23/2020 1005   CREATININE 1.15 (H) 04/27/2020 1210   CALCIUM 9.5 05/23/2020 1005   PROT 7.8 04/27/2020 1210   ALBUMIN 4.2 04/27/2020 1210   AST 15 04/27/2020 1210   ALT 11 04/27/2020 1210   ALKPHOS 68 04/27/2020 1210   BILITOT 0.5 04/27/2020 1210   GFRNONAA 48 (L) 05/23/2020 1005   GFRNONAA 48 (L) 04/27/2020 1210   GFRAA 65 04/18/2016 1035    No results found for: TOTALPROTELP, ALBUMINELP, A1GS, A2GS, BETS, BETA2SER, GAMS, MSPIKE, SPEI  Lab Results  Component Value Date   WBC 7.4 04/27/2020   NEUTROABS 4.2 04/27/2020   HGB 14.0 04/27/2020   HCT 42.1 04/27/2020   MCV 89.0 04/27/2020   PLT 248 04/27/2020    No results found for: LABCA2  No components found for: QPYPPJ093  No results for input(s): INR in the last 168 hours.  No results found for: LABCA2  No results found for: OIZ124  No results found for: PYK998  No results found for: PJA250  No results found for: CA2729  No components found for: HGQUANT  No results found for: CEA1 / No results found for: CEA1   No results found for:  AFPTUMOR  No results  found for: CHROMOGRNA  No results found for: KPAFRELGTCHN, LAMBDASER, KAPLAMBRATIO (kappa/lambda light chains)  No results found for: HGBA, HGBA2QUANT, HGBFQUANT, HGBSQUAN (Hemoglobinopathy evaluation)   No results found for: LDH  No results found for: IRON, TIBC, IRONPCTSAT (Iron and TIBC)  No results found for: FERRITIN  Urinalysis    Component Value Date/Time   COLORURINE YELLOW 03/07/2015 [place] 03/07/2015 1033   LABSPEC 1.006 03/07/2015 1033   PHURINE 7.0 03/07/2015 [place] 03/07/2015 [place] 03/07/2015 1033   BILIRUBINUR NEGATIVE 03/07/2015 1033   KETONESUR NEGATIVE 03/07/2015 1033   PROTEINUR NEGATIVE 03/07/2015 1033   UROBILINOGEN 0.2 10/27/2010 0945   NITRITE NEGATIVE 03/07/2015 1033   LEUKOCYTESUR TRACE (A) 03/07/2015 1033    STUDIES: No results found.   ELIGIBLE FOR AVAILABLE RESEARCH PROTOCOL: no  ASSESSMENT: 80 y.o. [place] woman status post right breast lower outer quadrant biopsy 04/20/2020 for a clinical T1a N0, stage IA invasive ductal carcinoma, grade 2, estrogen and progesterone receptor positive, HER2 not amplified, with an MIB-1 of 10%.  (1) genetics testing 05/08/2020 through the [place] + RNA[facility]ight panel found no deleterious mutations in AIP, ALK, APC, ATM, AXIN2, BAP1, BARD1, BLM, BMPR1A, BRCA1, BRCA2, BRIP1, CDC73, CDH1, CDK4, CDKN1B, CDKN2A, CHEK2, CTNNA1, DICER1, FANCC, FH, FLCN, GALNT12, KIF1B, LZTR1, MAX, MEN1, MET, MLH1, MSH2, MSH3, MSH6, MUTYH, NBN, NF1, NF2, NTHL1, PALB2, PHOX2B, PMS2, POT1, PRKAR1A, PTCH1, PTEN, RAD51C, RAD51D, RB1, RECQL, RET, SDHA, SDHAF2, SDHB, SDHC, SDHD, SMAD4, SMARCA4, SMARCB1, SMARCE1, STK11, SUFU, TMEM127, TP53, TSC1, TSC2, VHL and XRCC2 (sequencing and deletion/duplication); EGFR, EGLN1, HOXB13, KIT, MITF, PDGFRA, POLD1 and POLE (sequencing only); EPCAM and GREM1 (deletion/duplication only). RNA data is routinely analyzed for  use in variant interpretation for all genes.  (2) right lumpectomy 05/26/2020 showed a pT1b pNX, stage IA invasive ductal carcinoma, grade 2, with negative margins  (3) no adjuvant radiation (see Dr. Lanell Persons 05/10/2020 note).  (4) tamoxifen started 07/05/2020  (A) bone density  (B) s/p hysterectomy  (C) s/p hormone replacement >91 years w/o complications   PLAN: Lucendia Herrlich is 3 months out from definitive surgery for her breast cancer.  She is tolerating tamoxifen generally well aside from the hot flashes.  I suggested we could try venlafaxine but recently hot flashes do improve with time and she would like to wait and see.  I let her know that her daughter had called (I told [name] that I would tell her mother) and that it is nice to have children that worry about [place].  My suggestion is that she let [name] know the next time she has an appointment here so that she or any other family member who is concerned about her can come with her and hear the news directly from the doctor.  I think they would find that reassuring.  She will have her next mammogram, which will be her new baseline, in October, and she will see me in November.  From that point we will start lengthening the follow-up interval  Total encounter time 25 minutes.Sarajane Jews C. Creig Landin, MD 08/16/2020 10:50 AM Medical Oncology and Hematology [facility] [place], [place] [address] Tel. [phone]    Fax. [phone]   This document serves as a record of services personally performed by Lurline Del, MD. It was created on his behalf by Wilburn Mylar, a trained medical scribe. The creation of this record is based on the scribe's personal observations and the provider's statements  to them.   I, Lurline Del MD, have reviewed the above documentation for accuracy and completeness, and I agree with the above.   *Total Encounter Time as defined by the Centers for Medicare and Medicaid  Services includes, in addition to the face-to-face time of a patient visit (documented in the note above) non-face-to-face time: obtaining and reviewing outside history, ordering and reviewing medications, tests or procedures, care coordination (communications with other health care professionals or caregivers) and documentation in the medical record.

## 2020-08-16 ENCOUNTER — Other Ambulatory Visit: Payer: Self-pay

## 2020-08-16 ENCOUNTER — Inpatient Hospital Stay: Payer: PPO | Attending: Oncology | Admitting: Oncology

## 2020-08-16 VITALS — BP 110/51 | HR 76 | Temp 97.7°F | Resp 18 | Ht 63.0 in | Wt 176.3 lb

## 2020-08-16 DIAGNOSIS — Z79899 Other long term (current) drug therapy: Secondary | ICD-10-CM | POA: Insufficient documentation

## 2020-08-16 DIAGNOSIS — Z7982 Long term (current) use of aspirin: Secondary | ICD-10-CM | POA: Diagnosis not present

## 2020-08-16 DIAGNOSIS — C50511 Malignant neoplasm of lower-outer quadrant of right female breast: Secondary | ICD-10-CM | POA: Diagnosis not present

## 2020-08-16 DIAGNOSIS — E785 Hyperlipidemia, unspecified: Secondary | ICD-10-CM | POA: Diagnosis not present

## 2020-08-16 DIAGNOSIS — E039 Hypothyroidism, unspecified: Secondary | ICD-10-CM | POA: Insufficient documentation

## 2020-08-16 DIAGNOSIS — Z803 Family history of malignant neoplasm of breast: Secondary | ICD-10-CM | POA: Diagnosis not present

## 2020-08-16 DIAGNOSIS — Z170 Estrogen receptor positive status [ER+]: Secondary | ICD-10-CM

## 2020-08-16 DIAGNOSIS — Z79890 Hormone replacement therapy (postmenopausal): Secondary | ICD-10-CM | POA: Diagnosis not present

## 2020-08-16 DIAGNOSIS — Z808 Family history of malignant neoplasm of other organs or systems: Secondary | ICD-10-CM | POA: Insufficient documentation

## 2020-08-16 DIAGNOSIS — Z8042 Family history of malignant neoplasm of prostate: Secondary | ICD-10-CM | POA: Insufficient documentation

## 2020-08-16 DIAGNOSIS — I10 Essential (primary) hypertension: Secondary | ICD-10-CM | POA: Diagnosis not present

## 2020-08-16 DIAGNOSIS — Z79810 Long term (current) use of selective estrogen receptor modulators (SERMs): Secondary | ICD-10-CM | POA: Insufficient documentation

## 2020-08-16 DIAGNOSIS — Z801 Family history of malignant neoplasm of trachea, bronchus and lung: Secondary | ICD-10-CM | POA: Insufficient documentation

## 2020-08-16 DIAGNOSIS — Z85820 Personal history of malignant melanoma of skin: Secondary | ICD-10-CM | POA: Diagnosis not present

## 2020-08-18 ENCOUNTER — Telehealth: Payer: Self-pay | Admitting: Oncology

## 2020-08-18 NOTE — Telephone Encounter (Signed)
Scheduled appts per 8/9 los. Faxed orders to [facility]. Pt is aware.

## 2020-09-06 ENCOUNTER — Ambulatory Visit (INDEPENDENT_AMBULATORY_CARE_PROVIDER_SITE_OTHER): Payer: PPO

## 2020-09-06 DIAGNOSIS — I442 Atrioventricular block, complete: Secondary | ICD-10-CM | POA: Diagnosis not present

## 2020-09-06 LAB — CUP PACEART REMOTE DEVICE CHECK
Battery Remaining Longevity: 132 mo
Battery Remaining Percentage: 100 %
Brady Statistic RA Percent Paced: 14 %
Brady Statistic RV Percent Paced: 99 %
Date Time Interrogation Session: 20220830050000
Implantable Lead Implant Date: 20070626
Implantable Lead Implant Date: 20070626
Implantable Lead Location: 753859
Implantable Lead Location: 753860
Implantable Lead Model: 4456
Implantable Lead Model: 4469
Implantable Lead Serial Number: 452570
Implantable Lead Serial Number: 480762
Implantable Pulse Generator Implant Date: 20180416
Lead Channel Impedance Value: 339 Ohm
Lead Channel Impedance Value: 411 Ohm
Lead Channel Pacing Threshold Amplitude: 0.4 V
Lead Channel Pacing Threshold Amplitude: 0.9 V
Lead Channel Pacing Threshold Pulse Width: 0.4 ms
Lead Channel Pacing Threshold Pulse Width: 0.4 ms
Lead Channel Setting Pacing Amplitude: 1.5 V
Lead Channel Setting Pacing Amplitude: 2 V
Lead Channel Setting Pacing Pulse Width: 0.4 ms
Lead Channel Setting Sensing Sensitivity: 2.5 mV
Pulse Gen Serial Number: 782681

## 2020-09-19 NOTE — Progress Notes (Signed)
Remote pacemaker transmission.   

## 2020-11-01 ENCOUNTER — Encounter: Payer: Self-pay | Admitting: Oncology

## 2020-11-01 DIAGNOSIS — Z853 Personal history of malignant neoplasm of breast: Secondary | ICD-10-CM | POA: Diagnosis not present

## 2020-11-25 ENCOUNTER — Other Ambulatory Visit: Payer: Self-pay

## 2020-11-25 DIAGNOSIS — Z170 Estrogen receptor positive status [ER+]: Secondary | ICD-10-CM

## 2020-11-25 DIAGNOSIS — C50511 Malignant neoplasm of lower-outer quadrant of right female breast: Secondary | ICD-10-CM

## 2020-11-27 NOTE — Progress Notes (Signed)
[name]  [phone] Fax:([phone]) [phone]     ID: [name] DOB: Aug 13, 1940  MR#: [id number]  JAS#:[id number]  Patient Care Team: Mayra Neer, MD as PCP - General (Family Medicine) Evans Lance, MD as PCP - Electrophysiology (Cardiology) Rockwell Germany, RN as Oncology Nurse Navigator Mauro Kaufmann, RN as Oncology Nurse Navigator Jovita Kussmaul, MD as Consulting Physician (General Surgery) Phinneas Shakoor, Virgie Dad, MD as Consulting Physician (Oncology) Eppie Gibson, MD as Attending Physician (Radiation Oncology) Ladene Artist, MD as Consulting Physician (Gastroenterology) Chauncey Cruel, MD OTHER MD:   CHIEF COMPLAINT: Estrogen receptor positive breast cancer  CURRENT TREATMENT: Tamoxifen   INTERVAL HISTORY: [name] returns today for follow up of her estrogen receptor positive breast cancer.  She is accompanied by her daughter [name].  She started tamoxifen on 07/05/2020.  She had some hot flashes initially but these are completely resolved.  She does not have any vaginal wetness issues.  She is tolerating the medication with no side effects that she is aware of  Bilateral diagnostic mammography at [facility] 11/01/2020 showed the breast density to be category A .  There was no evidence of malignancy.  REVIEW OF SYSTEMS: [name] walks about 20 minutes a day with a neighbor.  She does all her housework and is planning to cook for Thanksgiving's.  She is already outputting up her Christmas decorations.  A detailed review of systems was otherwise stable.   COVID 19 VACCINATION STATUS: Moderna x2, refuses boosters; had COVID fall 2021   HISTORY OF CURRENT ILLNESS: From the original intake note:  [name] had routine screening mammography on 03/28/2020 showing a possible abnormality in the right breast. She underwent right diagnostic mammography with tomography and right breast ultrasonography at [facility] on 04/07/2020 showing:  breast density category A; 0.5 cm round mass in right breast at 8 o'clock; no abnormal nodes in right axilla.  Accordingly on 04/20/2020 she proceeded to biopsy of the right breast area in question. The pathology from this procedure ([id number]) showed: invasive ductal carcinoma, grade 2; ductal carcinoma in situ. Prognostic indicators significant for: estrogen receptor, >95% positive and progesterone receptor, >95% positive, both with strong staining intensity. Proliferation marker Ki67 at 10%. HER2 equivocal by immunohistochemistry (2+), but negative by fluorescent in situ hybridization with a signals ratio 1.32 and number per cell 1.85.   Cancer Staging  Malignant neoplasm of lower-outer quadrant of right breast of female, estrogen receptor positive ([place]) Staging form: Breast, AJCC 8th Edition - Clinical stage from 04/27/2020: Stage IA (cT1a, cN0, cM0, G2, ER+, PR+, HER2-) - Signed by Chauncey Cruel, MD on 04/27/2020 Stage prefix: Initial diagnosis Histologic grading system: 3 grade system  The patient's subsequent history is as detailed below.   PAST MEDICAL HISTORY: Past Medical History:  Diagnosis Date   Arthritis    Atrioventricular block, complete ([place])    Breast cancer ([place]) 04/2020   right breast IDC   Cardiac pacemaker in situ    CHB (complete heart block) ([place]) 2007   Essential hypertension, benign    Family history of breast cancer    Family history of kidney cancer    Family history of lung cancer    Family history of melanoma    Family history of prostate cancer    Family history of thyroid cancer    Frequency of urination    History of skin cancer    Hyperlipidemia    Hyperlipidemia    Hypothyroidism  Malignant melanoma ([place]) 03/12/2016   LEFT KNEE- TX WITH EXCISION   Postmenopausal    SCC (squamous cell carcinoma) 10/27/2014   SCC/KA-LEFT SHIN- TX CURET X 3,5FU   Thyroid disease    hypothyroid    PAST SURGICAL HISTORY: Past Surgical History:   Procedure Laterality Date   ABDOMINAL HYSTERECTOMY     BLADDER REPAIR     BREAST LUMPECTOMY WITH RADIOACTIVE SEED LOCALIZATION Right 05/26/2020   Procedure: RIGHT BREAST LUMPECTOMY WITH RADIOACTIVE SEED LOCALIZATION;  Surgeon: Jovita Kussmaul, MD;  Location: [place];  Service: General;  Laterality: Right;   COLONOSCOPY     KNEE ARTHROSCOPY     rt   PACEMAKER INSERTION  07-03-2005   [employer] Guidant    PPM GENERATOR [place] N/A 04/23/2016   Procedure: PPM Generator Changeout;  Surgeon: Evans Lance, MD;  Location: [place] [facility];  Service: Cardiovascular;  Laterality: N/A;   TOTAL KNEE ARTHROPLASTY     right   TOTAL KNEE ARTHROPLASTY Left 03/14/2015   Procedure: TOTAL KNEE ARTHROPLASTY;  Surgeon: Gaynelle Arabian, MD;  Location: [facility];  Service: Orthopedics;  Laterality: Left;    FAMILY HISTORY: Family History  Problem Relation Age of Onset   Lung cancer Mother 40       smoker   Heart attack Father        pacemaker & defibrillator   Breast cancer Sister 12   Prostate cancer Brother 4   Kidney cancer Sister 12   Melanoma Son 35   Thyroid cancer Niece        dx 69s   Colon cancer Neg Hx    Esophageal cancer Neg Hx    Stomach cancer Neg Hx    Rectal cancer Neg Hx    Her father died at age 62 from [place]. Her mother died at age 34 from lung cancer, diagnosed at age 67. She was a smoker. [name] has 3 brothers and 4 sisters. Aside from her mother, she reports breast cancer in a sister at age 67 and prostate cancer in a brother.   GYNECOLOGIC HISTORY:  No LMP recorded. Patient has had a hysterectomy. Menarche: 80 years old Age at first live birth: 80 years old [place] P 3 LMP age 83, with hysterectomy HRT took for 10 years hollowing hysterectomy  Hysterectomy? yes BSO? Only one ovary taken   SOCIAL HISTORY: (updated 04/2020)  [name] is currently retired from working as a Scientist, clinical (histocompatibility and immunogenetics) at [employer]. She is widowed. She lives at home by herself. Son [name], age  63, works in Architect in [place], [place]. Son [name], age 11, owns a landscape business here in [place]. Daughter [name], age 69, if a Government social research officer here in [place].  The patient's Sister [name] is also my patient.  [name] has 9 grandchildren (no "greats"). She attends [employer]    ADVANCED DIRECTIVES: in place, son [name] is her 18 and can be reached at [phone]   HEALTH MAINTENANCE: Social History   Tobacco Use   Smoking status: Former    Types: Cigarettes    Quit date: 03/06/1986    Years since quitting: 34.7   Smokeless tobacco: Never  Vaping Use   Vaping Use: Never used  Substance Use Topics   Alcohol use: Yes    Comment: wine socially   Drug use: No     Colonoscopy: 01/2020 (Dr. Fuller Plan), repeat not indicated due to age  PAP: date unknown  Bone density: none on file   Allergies  Allergen Reactions   Lipitor [Atorvastatin] Other (See Comments)    Muscle aches    Current Outpatient Medications  Medication Sig Dispense Refill   amLODipine (NORVASC) 5 MG tablet Take 5 mg by mouth Daily.      aspirin EC 81 MG tablet Take 81 mg by mouth daily.     Cholecalciferol (VITAMIN D3 PO) Take 2,000 Units by mouth daily.     levothyroxine (SYNTHROID, LEVOTHROID) 100 MCG tablet Take 100 mcg by mouth daily.     lisinopril-hydrochlorothiazide (PRINZIDE,ZESTORETIC) 20-25 MG per tablet Take 1 tablet by mouth Daily.     pravastatin (PRAVACHOL) 40 MG tablet Take 40 mg by mouth Daily.      No current facility-administered medications for this visit.    OBJECTIVE: White woman who appears stated age  80:   11/28/20 0942  BP: 136/60  Pulse: 69  Resp: 18  Temp: 97.9 F (36.6 C)  SpO2: 100%       Body mass index is 31.66 kg/m.   Wt Readings from Last 3 Encounters:  11/28/20 178 lb 11.2 oz (81.1 kg)  08/16/20 176 lb 4.8 oz (80 kg)  07/05/20 183 lb 1.6 oz (83.1 kg)      ECOG FS:1 - Symptomatic but completely ambulatory  Sclerae  unicteric, EOMs intact Wearing a mask No cervical or supraclavicular adenopathy Lungs no rales or rhonchi Heart regular rate and rhythm Abd soft, nontender, positive bowel sounds MSK no focal spinal tenderness, no upper extremity lymphedema Neuro: nonfocal, well oriented, appropriate affect Breasts: The right breast has undergone lumpectomy.  There is no evidence of local recurrence.  The left breast and both axillae are benign.   LAB RESULTS:  CMP     Component Value Date/Time   NA 139 11/28/2020 0854   NA 140 04/18/2016 1035   K 4.3 11/28/2020 0854   CL 106 11/28/2020 0854   CO2 26 11/28/2020 0854   GLUCOSE 84 11/28/2020 0854   BUN 21 11/28/2020 0854   BUN 25 04/18/2016 1035   CREATININE 1.12 (H) 11/28/2020 0854   CALCIUM 9.4 11/28/2020 0854   PROT 6.9 11/28/2020 0854   ALBUMIN 3.9 11/28/2020 0854   AST 15 11/28/2020 0854   ALT 10 11/28/2020 0854   ALKPHOS 43 11/28/2020 0854   BILITOT 0.4 11/28/2020 0854   GFRNONAA 50 (L) 11/28/2020 0854   GFRAA 65 04/18/2016 1035    No results found for: TOTALPROTELP, ALBUMINELP, A1GS, A2GS, BETS, BETA2SER, GAMS, MSPIKE, SPEI  Lab Results  Component Value Date   WBC 6.5 11/28/2020   NEUTROABS 3.5 11/28/2020   HGB 12.6 11/28/2020   HCT 38.1 11/28/2020   MCV 91.8 11/28/2020   PLT 192 11/28/2020    No results found for: LABCA2  No components found for: IONGEX528  No results for input(s): INR in the last 168 hours.  No results found for: LABCA2  No results found for: UXL244  No results found for: WNU272  No results found for: ZDG644  No results found for: CA2729  No components found for: HGQUANT  No results found for: CEA1 / No results found for: CEA1   No results found for: AFPTUMOR  No results found for: CHROMOGRNA  No results found for: KPAFRELGTCHN, LAMBDASER, KAPLAMBRATIO (kappa/lambda light chains)  No results found for: HGBA, HGBA2QUANT, HGBFQUANT, HGBSQUAN (Hemoglobinopathy evaluation)   No results  found for: LDH  No results found for: IRON, TIBC, IRONPCTSAT (Iron and TIBC)  No results found for: FERRITIN  Urinalysis  Component Value Date/Time   COLORURINE YELLOW 03/07/2015 1033   APPEARANCEUR CLEAR 03/07/2015 1033   LABSPEC 1.006 03/07/2015 1033   PHURINE 7.0 03/07/2015 1033   GLUCOSEU NEGATIVE 03/07/2015 1033   HGBUR NEGATIVE 03/07/2015 1033   BILIRUBINUR NEGATIVE 03/07/2015 1033   KETONESUR NEGATIVE 03/07/2015 1033   PROTEINUR NEGATIVE 03/07/2015 1033   UROBILINOGEN 0.2 10/27/2010 0945   NITRITE NEGATIVE 03/07/2015 1033   LEUKOCYTESUR TRACE (A) 03/07/2015 1033    STUDIES: No results found.   ELIGIBLE FOR AVAILABLE RESEARCH PROTOCOL: no  ASSESSMENT: 80 y.o. [place] woman status post right breast lower outer quadrant biopsy 04/20/2020 for a clinical T1a N0, stage IA invasive ductal carcinoma, grade 2, estrogen and progesterone receptor positive, HER2 not amplified, with an MIB-1 of 10%.  (1) genetics testing 05/08/2020 through the [facility] panel found no deleterious mutations in AIP, ALK, APC, ATM, AXIN2, BAP1, BARD1, BLM, BMPR1A, BRCA1, BRCA2, BRIP1, CDC73, CDH1, CDK4, CDKN1B, CDKN2A, CHEK2, CTNNA1, DICER1, FANCC, FH, FLCN, GALNT12, KIF1B, LZTR1, MAX, MEN1, MET, MLH1, MSH2, MSH3, MSH6, MUTYH, NBN, NF1, NF2, NTHL1, PALB2, PHOX2B, PMS2, POT1, PRKAR1A, PTCH1, PTEN, RAD51C, RAD51D, RB1, RECQL, RET, SDHA, SDHAF2, SDHB, SDHC, SDHD, SMAD4, SMARCA4, SMARCB1, SMARCE1, STK11, SUFU, TMEM127, TP53, TSC1, TSC2, VHL and XRCC2 (sequencing and deletion/duplication); EGFR, EGLN1, HOXB13, KIT, MITF, PDGFRA, POLD1 and POLE (sequencing only); EPCAM and GREM1 (deletion/duplication only). RNA data is routinely analyzed for use in variant interpretation for all genes.  (2) right lumpectomy 05/26/2020 showed a pT1b pNX, stage IA invasive ductal carcinoma, grade 2, with negative margins  (3) no adjuvant radiation (see Dr. Lanell Persons 05/10/2020 note).  (4)  tamoxifen started 07/05/2020  (A) bone density  (B) s/p hysterectomy  (C) s/p hormone replacement >25 years w/o complications   PLAN: [name] is half a year out from definitive surgery for her breast cancer with no evidence of disease recurrence.  This is favorable.  She is tolerating tamoxifen well and the plan is to continue that a total of 5 years.  She will see [place] again in April 2023.  She will then have her next mammogram at [facility]11/02/2021 .  We will see her shortly after that mammogram and yearly thereafter.  I commended her exercise program.  She knows to call us for any other issue that may develop before the next visit.  Total encounter time 25 minutes.Sarajane Jews C. Rollins Wrightson, MD 11/28/2020 9:56 AM Medical Oncology and Hematology [facility] [place], [place] [address] Tel. [phone]    Fax. [phone]   This document serves as a record of services personally performed by Lurline Del, MD. It was created on his behalf by Wilburn Mylar, a trained medical scribe. The creation of this record is based on the scribe's personal observations and the provider's statements to them.   I, Lurline Del MD, have reviewed the above documentation for accuracy and completeness, and I agree with the above.   *Total Encounter Time as defined by the Centers for Medicare and Medicaid Services includes, in addition to the face-to-face time of a patient visit (documented in the note above) non-face-to-face time: obtaining and reviewing outside history, ordering and reviewing medications, tests or procedures, care coordination (communications with other health care professionals or caregivers) and documentation in the medical record.

## 2020-11-28 ENCOUNTER — Other Ambulatory Visit: Payer: Self-pay

## 2020-11-28 ENCOUNTER — Inpatient Hospital Stay: Payer: PPO | Attending: Oncology

## 2020-11-28 ENCOUNTER — Inpatient Hospital Stay: Payer: PPO | Admitting: Oncology

## 2020-11-28 VITALS — BP 136/60 | HR 69 | Temp 97.9°F | Resp 18 | Ht 63.0 in | Wt 178.7 lb

## 2020-11-28 DIAGNOSIS — Z801 Family history of malignant neoplasm of trachea, bronchus and lung: Secondary | ICD-10-CM | POA: Diagnosis not present

## 2020-11-28 DIAGNOSIS — Z8042 Family history of malignant neoplasm of prostate: Secondary | ICD-10-CM | POA: Diagnosis not present

## 2020-11-28 DIAGNOSIS — Z808 Family history of malignant neoplasm of other organs or systems: Secondary | ICD-10-CM | POA: Insufficient documentation

## 2020-11-28 DIAGNOSIS — Z8051 Family history of malignant neoplasm of kidney: Secondary | ICD-10-CM | POA: Insufficient documentation

## 2020-11-28 DIAGNOSIS — Z170 Estrogen receptor positive status [ER+]: Secondary | ICD-10-CM | POA: Insufficient documentation

## 2020-11-28 DIAGNOSIS — Z803 Family history of malignant neoplasm of breast: Secondary | ICD-10-CM | POA: Insufficient documentation

## 2020-11-28 DIAGNOSIS — C50511 Malignant neoplasm of lower-outer quadrant of right female breast: Secondary | ICD-10-CM | POA: Diagnosis not present

## 2020-11-28 DIAGNOSIS — I10 Essential (primary) hypertension: Secondary | ICD-10-CM | POA: Insufficient documentation

## 2020-11-28 DIAGNOSIS — Z87891 Personal history of nicotine dependence: Secondary | ICD-10-CM | POA: Diagnosis not present

## 2020-11-28 LAB — CBC WITH DIFFERENTIAL (CANCER CENTER ONLY)
Abs Immature Granulocytes: 0.01 10*3/uL (ref 0.00–0.07)
Basophils Absolute: 0 10*3/uL (ref 0.0–0.1)
Basophils Relative: 1 %
Eosinophils Absolute: 0.2 10*3/uL (ref 0.0–0.5)
Eosinophils Relative: 4 %
HCT: 38.1 % (ref 36.0–46.0)
Hemoglobin: 12.6 g/dL (ref 12.0–15.0)
Immature Granulocytes: 0 %
Lymphocytes Relative: 34 %
Lymphs Abs: 2.2 10*3/uL (ref 0.7–4.0)
MCH: 30.4 pg (ref 26.0–34.0)
MCHC: 33.1 g/dL (ref 30.0–36.0)
MCV: 91.8 fL (ref 80.0–100.0)
Monocytes Absolute: 0.5 10*3/uL (ref 0.1–1.0)
Monocytes Relative: 8 %
Neutro Abs: 3.5 10*3/uL (ref 1.7–7.7)
Neutrophils Relative %: 53 %
Platelet Count: 192 10*3/uL (ref 150–400)
RBC: 4.15 MIL/uL (ref 3.87–5.11)
RDW: 13.4 % (ref 11.5–15.5)
WBC Count: 6.5 10*3/uL (ref 4.0–10.5)
nRBC: 0 % (ref 0.0–0.2)

## 2020-11-28 LAB — CMP (CANCER CENTER ONLY)
ALT: 10 U/L (ref 0–44)
AST: 15 U/L (ref 15–41)
Albumin: 3.9 g/dL (ref 3.5–5.0)
Alkaline Phosphatase: 43 U/L (ref 38–126)
Anion gap: 7 (ref 5–15)
BUN: 21 mg/dL (ref 8–23)
CO2: 26 mmol/L (ref 22–32)
Calcium: 9.4 mg/dL (ref 8.9–10.3)
Chloride: 106 mmol/L (ref 98–111)
Creatinine: 1.12 mg/dL — ABNORMAL HIGH (ref 0.44–1.00)
GFR, Estimated: 50 mL/min — ABNORMAL LOW (ref >60)
Glucose, Bld: 84 mg/dL (ref 70–99)
Potassium: 4.3 mmol/L (ref 3.5–5.1)
Sodium: 139 mmol/L (ref 135–145)
Total Bilirubin: 0.4 mg/dL (ref 0.3–1.2)
Total Protein: 6.9 g/dL (ref 6.5–8.1)

## 2020-11-28 MED ORDER — TAMOXIFEN CITRATE 20 MG PO TABS: 20.0000 mg | ORAL_TABLET | Freq: Every day | ORAL | 12 refills | Status: AC

## 2020-12-06 ENCOUNTER — Ambulatory Visit (INDEPENDENT_AMBULATORY_CARE_PROVIDER_SITE_OTHER): Payer: PPO

## 2020-12-06 DIAGNOSIS — I442 Atrioventricular block, complete: Secondary | ICD-10-CM

## 2020-12-06 LAB — CUP PACEART REMOTE DEVICE CHECK
Battery Remaining Longevity: 126 mo
Battery Remaining Percentage: 100 %
Brady Statistic RA Percent Paced: 17 %
Brady Statistic RV Percent Paced: 100 %
Date Time Interrogation Session: 20221129050100
Implantable Lead Implant Date: 20070626
Implantable Lead Implant Date: 20070626
Implantable Lead Location: 753859
Implantable Lead Location: 753860
Implantable Lead Model: 4456
Implantable Lead Model: 4469
Implantable Lead Serial Number: 452570
Implantable Lead Serial Number: 480762
Implantable Pulse Generator Implant Date: 20180416
Lead Channel Impedance Value: 327 Ohm
Lead Channel Impedance Value: 419 Ohm
Lead Channel Pacing Threshold Amplitude: 0.5 V
Lead Channel Pacing Threshold Amplitude: 1 V
Lead Channel Pacing Threshold Pulse Width: 0.4 ms
Lead Channel Pacing Threshold Pulse Width: 0.4 ms
Lead Channel Setting Pacing Amplitude: 1.4 V
Lead Channel Setting Pacing Amplitude: 2 V
Lead Channel Setting Pacing Pulse Width: 0.4 ms
Lead Channel Setting Sensing Sensitivity: 2.5 mV
Pulse Gen Serial Number: 782681

## 2020-12-15 NOTE — Progress Notes (Signed)
Remote pacemaker transmission.   

## 2021-01-17 ENCOUNTER — Other Ambulatory Visit: Payer: Self-pay

## 2021-01-17 ENCOUNTER — Ambulatory Visit: Payer: PPO | Admitting: Internal Medicine

## 2021-01-17 VITALS — BP 102/60 | HR 72 | Ht 63.5 in | Wt 177.0 lb

## 2021-01-17 DIAGNOSIS — I10 Essential (primary) hypertension: Secondary | ICD-10-CM

## 2021-01-17 DIAGNOSIS — I442 Atrioventricular block, complete: Secondary | ICD-10-CM | POA: Diagnosis not present

## 2021-01-17 DIAGNOSIS — Z950 Presence of cardiac pacemaker: Secondary | ICD-10-CM

## 2021-01-17 NOTE — Progress Notes (Signed)
HPI [name] returns today for followup of her CHB and HTN. She is a pleasant 81 yo woman who undewent PPM insertion in 2018. She has done well in the interim. No chest pain or sob. No syncope. No edema. She admits to dietary indiscretion. She has minimal edema.  Allergies  Allergen Reactions   Lipitor [Atorvastatin] Other (See Comments)    Muscle aches     Current Outpatient Medications  Medication Sig Dispense Refill   amLODipine (NORVASC) 5 MG tablet Take 5 mg by mouth Daily.      aspirin EC 81 MG tablet Take 81 mg by mouth daily.     Cholecalciferol (VITAMIN D3 PO) Take 2,000 Units by mouth daily.     levothyroxine (SYNTHROID, LEVOTHROID) 100 MCG tablet Take 100 mcg by mouth daily.     lisinopril-hydrochlorothiazide (PRINZIDE,ZESTORETIC) 20-25 MG per tablet Take 1 tablet by mouth Daily.     pravastatin (PRAVACHOL) 40 MG tablet Take 40 mg by mouth Daily.      tamoxifen (NOLVADEX) 20 MG tablet Take 20 mg by mouth daily.     No current facility-administered medications for this visit.     Past Medical History:  Diagnosis Date   Arthritis    Atrioventricular block, complete ([place])    Breast cancer ([place]) 04/2020   right breast IDC   Cardiac pacemaker in situ    CHB (complete heart block) ([place]) 2007   Essential hypertension, benign    Family history of breast cancer    Family history of kidney cancer    Family history of lung cancer    Family history of melanoma    Family history of prostate cancer    Family history of thyroid cancer    Frequency of urination    History of skin cancer    Hyperlipidemia    Hyperlipidemia    Hypothyroidism    Malignant melanoma ([place]) 03/12/2016   LEFT KNEE- TX WITH EXCISION   Postmenopausal    SCC (squamous cell carcinoma) 10/27/2014   SCC/KA-LEFT SHIN- TX CURET X 3,5FU   Thyroid disease    hypothyroid    ROS:   All systems reviewed and negative except as noted in the HPI.   Past Surgical History:  Procedure  Laterality Date   ABDOMINAL HYSTERECTOMY     BLADDER REPAIR     BREAST LUMPECTOMY WITH RADIOACTIVE SEED LOCALIZATION Right 05/26/2020   Procedure: RIGHT BREAST LUMPECTOMY WITH RADIOACTIVE SEED LOCALIZATION;  Surgeon: Jovita Kussmaul, MD;  Location: [place];  Service: General;  Laterality: Right;   COLONOSCOPY     KNEE ARTHROSCOPY     rt   PACEMAKER INSERTION  07-03-2005   [employer] Guidant    PPM GENERATOR [place] N/A 04/23/2016   Procedure: PPM Generator Changeout;  Surgeon: Evans Lance, MD;  Location: [place] [facility];  Service: Cardiovascular;  Laterality: N/A;   TOTAL KNEE ARTHROPLASTY     right   TOTAL KNEE ARTHROPLASTY Left 03/14/2015   Procedure: TOTAL KNEE ARTHROPLASTY;  Surgeon: Gaynelle Arabian, MD;  [facility];  Service: Orthopedics;  Laterality: Left;     Family History  Problem Relation Age of Onset   Lung cancer Mother 84       smoker   Heart attack Father        pacemaker & defibrillator   Breast cancer Sister 23   Prostate cancer Brother 38   Kidney cancer Sister 55   Melanoma Son 56  Thyroid cancer Niece        dx 39s   Colon cancer Neg Hx    Esophageal cancer Neg Hx    Stomach cancer Neg Hx    Rectal cancer Neg Hx      Social History   Socioeconomic History   Marital status: Widowed    Spouse name: Not on file   Number of children: Not on file   Years of education: Not on file   Highest education level: Not on file  Occupational History   Occupation: retired  Tobacco Use   Smoking status: Former    Types: Cigarettes    Quit date: 03/06/1986    Years since quitting: 34.8   Smokeless tobacco: Never  Vaping Use   Vaping Use: Never used  Substance and Sexual Activity   Alcohol use: Yes    Comment: wine socially   Drug use: No   Sexual activity: Not Currently    Birth control/protection: Surgical  Other Topics Concern   Not on file  Social History Narrative   Not on file   Social Determinants of Health    Financial Resource Strain: Not on file  Food Insecurity: Not on file  Transportation Needs: Not on file  Physical Activity: Not on file  Stress: Not on file  Social Connections: Not on file  Intimate Partner Violence: Not on file     BP 102/60    Pulse 72    Ht 5' 3.5" (1.613 m)    Wt 177 lb (80.3 kg)    SpO2 96%    BMI 30.86 kg/m   Physical Exam:  Well appearing NAD HEENT: Unremarkable Neck:  No JVD, no thyromegally Lymphatics:  No adenopathy Back:  No CVA tenderness Lungs:  Clear with no wheezes HEART:  Regular rate rhythm, no murmurs, no rubs, no clicks Abd:  soft, positive bowel sounds, no organomegally, no rebound, no guarding Ext:  2 plus pulses, no edema, no cyanosis, no clubbing Skin:  No rashes no nodules Neuro:  CN II through XII intact, motor grossly intact  EKG - nsr with ventricular pacing  DEVICE  Normal device function.  See PaceArt for details.   Assess/Plan:  1. CHB - she is asymptomatic, s/p PPM insertion. 2. PPM - her [employer] DDD PM is working normally. No escape today. 3. Obesity - she is encouraged to lose weight. 4. HTN - her bp is well controlled today. A little low though she is asymptomatic and not dizzy. I asked her to consider reducing the dose of amlodipine when she sees her primary MD.   [name]

## 2021-01-17 NOTE — Patient Instructions (Signed)
Medication Instructions:  Your physician recommends that you continue on your current medications as directed. Please refer to the Current Medication list given to you today.  Labwork: None ordered.  Testing/Procedures: None ordered.  Follow-Up: Your physician wants you to follow-up in: one year with Cristopher Peru, MD or one of the following Advanced Practice Providers on your designated Care Team:   Tommye Standard, [place] Legrand Como "Jonni Sanger" Chalmers Cater, [place]  Remote monitoring is used to monitor your Pacemaker from home. This monitoring reduces the number of office visits required to check your device to one time per year. It allows [place] to keep an eye on the functioning of your device to ensure it is working properly. You are scheduled for a device check from home on 03/07/2021. You may send your transmission at any time that day. If you have a wireless device, the transmission will be sent automatically. After your physician reviews your transmission, you will receive a postcard with your next transmission date.  Any Other Special Instructions Will Be Listed Below (If Applicable).  If you need a refill on your cardiac medications before your next appointment, please call your pharmacy.

## 2021-03-07 ENCOUNTER — Ambulatory Visit (INDEPENDENT_AMBULATORY_CARE_PROVIDER_SITE_OTHER): Payer: PPO

## 2021-03-07 DIAGNOSIS — I442 Atrioventricular block, complete: Secondary | ICD-10-CM

## 2021-03-07 LAB — CUP PACEART REMOTE DEVICE CHECK
Battery Remaining Longevity: 126 mo
Battery Remaining Percentage: 100 %
Brady Statistic RA Percent Paced: 27 %
Brady Statistic RV Percent Paced: 98 %
Date Time Interrogation Session: 20230228050100
Implantable Lead Implant Date: 20070626
Implantable Lead Implant Date: 20070626
Implantable Lead Location: 753859
Implantable Lead Location: 753860
Implantable Lead Model: 4456
Implantable Lead Model: 4469
Implantable Lead Serial Number: 452570
Implantable Lead Serial Number: 480762
Implantable Pulse Generator Implant Date: 20180416
Lead Channel Impedance Value: 341 Ohm
Lead Channel Impedance Value: 432 Ohm
Lead Channel Pacing Threshold Amplitude: 0.5 V
Lead Channel Pacing Threshold Amplitude: 0.9 V
Lead Channel Pacing Threshold Pulse Width: 0.4 ms
Lead Channel Pacing Threshold Pulse Width: 0.4 ms
Lead Channel Setting Pacing Amplitude: 1.4 V
Lead Channel Setting Pacing Amplitude: 2 V
Lead Channel Setting Pacing Pulse Width: 0.4 ms
Lead Channel Setting Sensing Sensitivity: 2.5 mV
Pulse Gen Serial Number: 782681

## 2021-03-14 NOTE — Progress Notes (Signed)
Remote pacemaker transmission.   

## 2021-04-12 ENCOUNTER — Encounter (HOSPITAL_COMMUNITY): Payer: Self-pay

## 2021-04-26 ENCOUNTER — Other Ambulatory Visit: Payer: Self-pay

## 2021-04-26 DIAGNOSIS — C50511 Malignant neoplasm of lower-outer quadrant of right female breast: Secondary | ICD-10-CM

## 2021-04-28 ENCOUNTER — Inpatient Hospital Stay: Payer: PPO | Admitting: Hematology and Oncology

## 2021-04-28 ENCOUNTER — Inpatient Hospital Stay: Payer: PPO | Attending: Hematology and Oncology

## 2021-04-28 ENCOUNTER — Other Ambulatory Visit: Payer: Self-pay

## 2021-04-28 ENCOUNTER — Encounter: Payer: Self-pay | Admitting: Hematology and Oncology

## 2021-04-28 VITALS — BP 126/63 | HR 70 | Temp 97.2°F | Resp 16 | Wt 181.5 lb

## 2021-04-28 DIAGNOSIS — Z801 Family history of malignant neoplasm of trachea, bronchus and lung: Secondary | ICD-10-CM | POA: Diagnosis not present

## 2021-04-28 DIAGNOSIS — C50511 Malignant neoplasm of lower-outer quadrant of right female breast: Secondary | ICD-10-CM

## 2021-04-28 DIAGNOSIS — Z90710 Acquired absence of both cervix and uterus: Secondary | ICD-10-CM | POA: Diagnosis not present

## 2021-04-28 DIAGNOSIS — Z170 Estrogen receptor positive status [ER+]: Secondary | ICD-10-CM | POA: Insufficient documentation

## 2021-04-28 DIAGNOSIS — Z87891 Personal history of nicotine dependence: Secondary | ICD-10-CM | POA: Diagnosis not present

## 2021-04-28 DIAGNOSIS — Z803 Family history of malignant neoplasm of breast: Secondary | ICD-10-CM | POA: Diagnosis not present

## 2021-04-28 DIAGNOSIS — Z8051 Family history of malignant neoplasm of kidney: Secondary | ICD-10-CM | POA: Diagnosis not present

## 2021-04-28 DIAGNOSIS — Z8042 Family history of malignant neoplasm of prostate: Secondary | ICD-10-CM | POA: Diagnosis not present

## 2021-04-28 LAB — CBC WITH DIFFERENTIAL (CANCER CENTER ONLY)
Abs Immature Granulocytes: 0.02 10*3/uL (ref 0.00–0.07)
Basophils Absolute: 0 10*3/uL (ref 0.0–0.1)
Basophils Relative: 1 %
Eosinophils Absolute: 0.2 10*3/uL (ref 0.0–0.5)
Eosinophils Relative: 3 %
HCT: 40.4 % (ref 36.0–46.0)
Hemoglobin: 12.9 g/dL (ref 12.0–15.0)
Immature Granulocytes: 0 %
Lymphocytes Relative: 34 %
Lymphs Abs: 2 10*3/uL (ref 0.7–4.0)
MCH: 29.1 pg (ref 26.0–34.0)
MCHC: 31.9 g/dL (ref 30.0–36.0)
MCV: 91.2 fL (ref 80.0–100.0)
Monocytes Absolute: 0.4 10*3/uL (ref 0.1–1.0)
Monocytes Relative: 7 %
Neutro Abs: 3.3 10*3/uL (ref 1.7–7.7)
Neutrophils Relative %: 55 %
Platelet Count: 183 10*3/uL (ref 150–400)
RBC: 4.43 MIL/uL (ref 3.87–5.11)
RDW: 13.8 % (ref 11.5–15.5)
WBC Count: 6 10*3/uL (ref 4.0–10.5)
nRBC: 0 % (ref 0.0–0.2)

## 2021-04-28 LAB — CMP (CANCER CENTER ONLY)
ALT: 9 U/L (ref 0–44)
AST: 15 U/L (ref 15–41)
Albumin: 3.9 g/dL (ref 3.5–5.0)
Alkaline Phosphatase: 43 U/L (ref 38–126)
Anion gap: 5 (ref 5–15)
BUN: 23 mg/dL (ref 8–23)
CO2: 28 mmol/L (ref 22–32)
Calcium: 9.3 mg/dL (ref 8.9–10.3)
Chloride: 107 mmol/L (ref 98–111)
Creatinine: 1.13 mg/dL — ABNORMAL HIGH (ref 0.44–1.00)
GFR, Estimated: 49 mL/min — ABNORMAL LOW (ref >60)
Glucose, Bld: 102 mg/dL — ABNORMAL HIGH (ref 70–99)
Potassium: 3.9 mmol/L (ref 3.5–5.1)
Sodium: 140 mmol/L (ref 135–145)
Total Bilirubin: 0.4 mg/dL (ref 0.3–1.2)
Total Protein: 6.9 g/dL (ref 6.5–8.1)

## 2021-04-28 MED ORDER — TAMOXIFEN CITRATE 20 MG PO TABS: 20.0000 mg | ORAL_TABLET | Freq: Every day | ORAL | 3 refills | Status: DC

## 2021-04-28 NOTE — Progress Notes (Signed)
?[place]  ?[phone] [phone]  ? ? ? ?ID: [name] DOB: 11-28-1940  MR#: [id number]  LFY#:[id number] ? ?Patient Care Team: ?Samantha Neer, MD as PCP - General (Family Medicine) ?Samantha Lance, MD as PCP - Electrophysiology (Cardiology) ?Samantha Germany, RN as Oncology Nurse Navigator ?Samantha Kaufmann, RN as Oncology Nurse Navigator ?Samantha Kussmaul, MD as Consulting Physician (General Surgery) ?Magrinat, Virgie Dad, MD (Inactive) as Consulting Physician (Oncology) ?Samantha Gibson, MD as Attending Physician (Radiation Oncology) ?Samantha Artist, MD as Consulting Physician (Gastroenterology) ?Samantha Pike, MD ? ? ?CHIEF COMPLAINT: Estrogen receptor positive breast cancer ? ?CURRENT TREATMENT: Tamoxifen ? ? ?INTERVAL HISTORY: ? ?[name] returns today for follow up of her estrogen receptor positive breast cancer. ?Patient has been tolerating tamoxifen very well.  She denies any side effects.  She has been exercising regularly, walking regularly and is active with all her household chores. ?Rest of the pertinent 10 point ROS reviewed and negative ? COVID 19 VACCINATION STATUS: Moderna x2, refuses boosters; had COVID fall 2021 ? ? ?HISTORY OF CURRENT ILLNESS: ?From the original intake note: ? ?[name] had routine screening mammography on 03/28/2020 showing a possible abnormality in the right breast. She underwent right diagnostic mammography with tomography and right breast ultrasonography at [facility] on 04/07/2020 showing: breast density category A; 0.5 cm round mass in right breast at 8 o'clock; no abnormal nodes in right axilla. ? ?Accordingly on 04/20/2020 she proceeded to biopsy of the right breast area in question. The pathology from this procedure ([id number]) showed: invasive ductal carcinoma, grade 2; ductal carcinoma in situ. Prognostic indicators significant for: estrogen receptor, >95% positive and progesterone receptor, >95% positive, both with  strong staining intensity. Proliferation marker Ki67 at 10%. HER2 equivocal by immunohistochemistry (2+), but negative by fluorescent in situ hybridization with a signals ratio 1.32 and number per cell 1.85. ? ? Cancer Staging  ?Malignant neoplasm of lower-outer quadrant of right breast of female, estrogen receptor positive ([place]) ?Staging form: Breast, AJCC 8th Edition ?- Clinical stage from 04/27/2020: Stage IA (cT1a, cN0, cM0, G2, ER+, PR+, HER2-) - Signed by Samantha Cruel, MD on 04/27/2020 ?Stage prefix: Initial diagnosis ?Histologic grading system: 3 grade system ? ? ?The patient's subsequent history is as detailed below. ? ? ?PAST MEDICAL HISTORY: ?Past Medical History:  ?Diagnosis Date  ? Arthritis   ? Atrioventricular block, complete (HCC)   ? Breast cancer ([place]) 04/2020  ? right breast IDC  ? Cardiac pacemaker in situ   ? CHB (complete heart block) ([place]) 2007  ? Essential hypertension, benign   ? Family history of breast cancer   ? Family history of kidney cancer   ? Family history of lung cancer   ? Family history of melanoma   ? Family history of prostate cancer   ? Family history of thyroid cancer   ? Frequency of urination   ? History of skin cancer   ? Hyperlipidemia   ? Hyperlipidemia   ? Hypothyroidism   ? Malignant melanoma ([place]) 03/12/2016  ? LEFT KNEE- TX WITH EXCISION  ? Postmenopausal   ? SCC (squamous cell carcinoma) 10/27/2014  ? SCC/KA-LEFT SHIN- TX CURET X 3,5FU  ? Thyroid disease   ? hypothyroid  ? ? ?PAST SURGICAL HISTORY: ?Past Surgical History:  ?Procedure Laterality Date  ? ABDOMINAL HYSTERECTOMY    ? BLADDER REPAIR    ? BREAST LUMPECTOMY WITH RADIOACTIVE SEED LOCALIZATION Right 05/26/2020  ? Procedure: RIGHT BREAST LUMPECTOMY WITH  RADIOACTIVE SEED LOCALIZATION;  Surgeon: Samantha Kussmaul, MD;  Location: [facility];  Service: General;  Laterality: Right;  ? COLONOSCOPY    ? KNEE ARTHROSCOPY    ? rt  ? PACEMAKER INSERTION  07-03-2005  ? [name]   ? PPM  GENERATOR CHANGEOUT N/A 04/23/2016  ? Procedure: PPM Generator Changeout;  Surgeon: Samantha Lance, MD;  Location: [facility];  Service: Cardiovascular;  Laterality: N/A;  ? TOTAL KNEE ARTHROPLASTY    ? right  ? TOTAL KNEE ARTHROPLASTY Left 03/14/2015  ? Procedure: TOTAL KNEE ARTHROPLASTY;  Surgeon: Samantha Arabian, MD;  [facility];  Service: Orthopedics;  Laterality: Left;  ? ? ?FAMILY HISTORY: ?Family History  ?Problem Relation Age of Onset  ? Lung cancer Mother 70  ?     smoker  ? Heart attack Father   ?     pacemaker & defibrillator  ? Breast cancer Sister 60  ? Prostate cancer Brother 104  ? Kidney cancer Sister 74  ? Melanoma Son 10  ? Thyroid cancer Niece   ?     dx 67s  ? Colon cancer Neg Hx   ? Esophageal cancer Neg Hx   ? Stomach cancer Neg Hx   ? Rectal cancer Neg Hx   ? Her father died at age 61 from [place]. Her mother died at age 27 from lung cancer, diagnosed at age 85. She was a smoker. [name] has 3 brothers and 4 sisters. Aside from her mother, she reports breast cancer in a sister at age 26 and prostate cancer in a brother. ? ? ?GYNECOLOGIC HISTORY:  ?No LMP recorded. Patient has had a hysterectomy. ?Menarche: 81 years old ?Age at first live birth: 81 years old ?GX P 3 ?LMP age 81, with hysterectomy ?HRT took for 10 years hollowing hysterectomy  ?Hysterectomy? yes ?BSO? Only one ovary taken ? ? ?SOCIAL HISTORY: (updated 04/2020)  ?[name] is currently retired from working as a Scientist, clinical (histocompatibility and immunogenetics) at [employer]. She is widowed. She lives at home by herself. Son [name], age 85, works in Architect in [place], [place]. Son [name], age 62, owns a landscape business here in [place]. Daughter [name], age 69, if a Government social research officer here in [place].  The patient's Sister [name] is also my patient.  [name] has 9 grandchildren (no "greats"). She attends [employer] ?  ? ADVANCED DIRECTIVES: in place, son [name] is her HCPOA and can be reached at [phone] ? ? ?HEALTH MAINTENANCE: ?Social  History  ? ?Tobacco Use  ? Smoking status: Former  ?  Types: Cigarettes  ?  Quit date: 03/06/1986  ?  Years since quitting: 35.1  ? Smokeless tobacco: Never  ?Vaping Use  ? Vaping Use: Never used  ?Substance Use Topics  ? Alcohol use: Yes  ?  Comment: wine socially  ? Drug use: No  ? ? ? Colonoscopy: 01/2020 (Dr. Fuller Plan), repeat not indicated due to age ? PAP: date unknown ? Bone density: none on file ?  ?Allergies  ?Allergen Reactions  ? Lipitor [Atorvastatin] Other (See Comments)  ?  Muscle aches  ? ? ?Current Outpatient Medications  ?Medication Sig Dispense Refill  ? amLODipine (NORVASC) 5 MG tablet Take 5 mg by mouth Daily.     ? aspirin EC 81 MG tablet Take 81 mg by mouth daily.    ? Cholecalciferol (VITAMIN D3 PO) Take 2,000 Units by mouth daily.    ? levothyroxine (SYNTHROID, LEVOTHROID) 100 MCG tablet Take  100 mcg by mouth daily.    ? lisinopril-hydrochlorothiazide (PRINZIDE,ZESTORETIC) 20-25 MG per tablet Take 1 tablet by mouth Daily.    ? pravastatin (PRAVACHOL) 40 MG tablet Take 40 mg by mouth Daily.     ? tamoxifen (NOLVADEX) 20 MG tablet Take 20 mg by mouth daily.    ? ?No current facility-administered medications for this visit.  ? ? ?OBJECTIVE: White woman who appears stated age ? ?Vitals:  ? 04/28/21 0958  ?BP: 126/63  ?Pulse: 70  ?Resp: 16  ?Temp: (!) 97.2 ?F (36.2 ?C)  ?SpO2: 99%  ? ? ?   Body mass index is 31.65 kg/m?.   ?Wt Readings from Last 3 Encounters:  ?04/28/21 181 lb 8 oz (82.3 kg)  ?01/17/21 177 lb (80.3 kg)  ?11/28/20 178 lb 11.2 oz (81.1 kg)  ? ? ?  ECOG FS:1 - Symptomatic but completely ambulatory ? ?Physical Exam ?Constitutional:   ?   Appearance: Normal appearance.  ?Cardiovascular:  ?   Rate and Rhythm: Normal rate and regular rhythm.  ?Chest:  ?   Comments: Bilateral breasts inspected.  She is status post right lumpectomy, no palpable masses or regional adenopathy.  Excellent cosmetic outcome ?Musculoskeletal:  ?   Cervical back: Normal range of motion and neck supple. No rigidity.   ?Lymphadenopathy:  ?   Cervical: No cervical adenopathy.  ?Skin: ?   General: Skin is warm and dry.  ?Neurological:  ?   General: No focal deficit present.  ?   Mental Status: She is alert.  ?Psychiatric:     ?

## 2021-06-06 ENCOUNTER — Ambulatory Visit (INDEPENDENT_AMBULATORY_CARE_PROVIDER_SITE_OTHER): Payer: PPO

## 2021-06-06 DIAGNOSIS — I442 Atrioventricular block, complete: Secondary | ICD-10-CM

## 2021-06-07 LAB — CUP PACEART REMOTE DEVICE CHECK
Battery Remaining Longevity: 120 mo
Battery Remaining Percentage: 100 %
Brady Statistic RA Percent Paced: 24 %
Brady Statistic RV Percent Paced: 99 %
Date Time Interrogation Session: 20230530050100
Implantable Lead Implant Date: 20070626
Implantable Lead Implant Date: 20070626
Implantable Lead Location: 753859
Implantable Lead Location: 753860
Implantable Lead Model: 4456
Implantable Lead Model: 4469
Implantable Lead Serial Number: 452570
Implantable Lead Serial Number: 480762
Implantable Pulse Generator Implant Date: 20180416
Lead Channel Impedance Value: 335 Ohm
Lead Channel Impedance Value: 411 Ohm
Lead Channel Pacing Threshold Amplitude: 0.5 V
Lead Channel Pacing Threshold Amplitude: 1.1 V
Lead Channel Pacing Threshold Pulse Width: 0.4 ms
Lead Channel Pacing Threshold Pulse Width: 0.4 ms
Lead Channel Setting Pacing Amplitude: 1.6 V
Lead Channel Setting Pacing Amplitude: 2 V
Lead Channel Setting Pacing Pulse Width: 0.4 ms
Lead Channel Setting Sensing Sensitivity: 2.5 mV
Pulse Gen Serial Number: 782681

## 2021-06-22 NOTE — Progress Notes (Signed)
Remote pacemaker transmission.   

## 2021-07-10 DIAGNOSIS — Z950 Presence of cardiac pacemaker: Secondary | ICD-10-CM | POA: Diagnosis not present

## 2021-07-10 DIAGNOSIS — N3941 Urge incontinence: Secondary | ICD-10-CM | POA: Diagnosis not present

## 2021-07-10 DIAGNOSIS — C50911 Malignant neoplasm of unspecified site of right female breast: Secondary | ICD-10-CM | POA: Diagnosis not present

## 2021-07-10 DIAGNOSIS — E782 Mixed hyperlipidemia: Secondary | ICD-10-CM | POA: Diagnosis not present

## 2021-07-10 DIAGNOSIS — Z0000 Encounter for general adult medical examination without abnormal findings: Secondary | ICD-10-CM | POA: Diagnosis not present

## 2021-07-10 DIAGNOSIS — E669 Obesity, unspecified: Secondary | ICD-10-CM | POA: Diagnosis not present

## 2021-07-10 DIAGNOSIS — D692 Other nonthrombocytopenic purpura: Secondary | ICD-10-CM | POA: Diagnosis not present

## 2021-07-10 DIAGNOSIS — E039 Hypothyroidism, unspecified: Secondary | ICD-10-CM | POA: Diagnosis not present

## 2021-07-10 DIAGNOSIS — M1990 Unspecified osteoarthritis, unspecified site: Secondary | ICD-10-CM | POA: Diagnosis not present

## 2021-07-10 DIAGNOSIS — I10 Essential (primary) hypertension: Secondary | ICD-10-CM | POA: Diagnosis not present

## 2021-07-10 DIAGNOSIS — E559 Vitamin D deficiency, unspecified: Secondary | ICD-10-CM | POA: Diagnosis not present

## 2021-09-05 ENCOUNTER — Ambulatory Visit (INDEPENDENT_AMBULATORY_CARE_PROVIDER_SITE_OTHER): Payer: PPO

## 2021-09-05 DIAGNOSIS — I442 Atrioventricular block, complete: Secondary | ICD-10-CM

## 2021-09-05 LAB — CUP PACEART REMOTE DEVICE CHECK
Battery Remaining Longevity: 120 mo
Battery Remaining Percentage: 100 %
Brady Statistic RA Percent Paced: 25 %
Brady Statistic RV Percent Paced: 99 %
Date Time Interrogation Session: 20230829050000
Implantable Lead Implant Date: 20070626
Implantable Lead Implant Date: 20070626
Implantable Lead Location: 753859
Implantable Lead Location: 753860
Implantable Lead Model: 4456
Implantable Lead Model: 4469
Implantable Lead Serial Number: 452570
Implantable Lead Serial Number: 480762
Implantable Pulse Generator Implant Date: 20180416
Lead Channel Impedance Value: 342 Ohm
Lead Channel Impedance Value: 426 Ohm
Lead Channel Pacing Threshold Amplitude: 0.5 V
Lead Channel Pacing Threshold Amplitude: 0.8 V
Lead Channel Pacing Threshold Pulse Width: 0.4 ms
Lead Channel Pacing Threshold Pulse Width: 0.4 ms
Lead Channel Setting Pacing Amplitude: 1.4 V
Lead Channel Setting Pacing Amplitude: 2 V
Lead Channel Setting Pacing Pulse Width: 0.4 ms
Lead Channel Setting Sensing Sensitivity: 2.5 mV
Pulse Gen Serial Number: 782681

## 2021-09-29 NOTE — Progress Notes (Signed)
Remote pacemaker transmission.   

## 2021-11-09 DIAGNOSIS — Z853 Personal history of malignant neoplasm of breast: Secondary | ICD-10-CM | POA: Diagnosis not present

## 2021-12-05 ENCOUNTER — Ambulatory Visit (INDEPENDENT_AMBULATORY_CARE_PROVIDER_SITE_OTHER): Payer: PPO

## 2021-12-05 DIAGNOSIS — I442 Atrioventricular block, complete: Secondary | ICD-10-CM | POA: Diagnosis not present

## 2021-12-05 LAB — CUP PACEART REMOTE DEVICE CHECK
Battery Remaining Longevity: 114 mo
Battery Remaining Percentage: 100 %
Brady Statistic RA Percent Paced: 27 %
Brady Statistic RV Percent Paced: 99 %
Date Time Interrogation Session: 20231128050100
Implantable Lead Connection Status: 753985
Implantable Lead Connection Status: 753985
Implantable Lead Implant Date: 20070626
Implantable Lead Implant Date: 20070626
Implantable Lead Location: 753859
Implantable Lead Location: 753860
Implantable Lead Model: 4456
Implantable Lead Model: 4469
Implantable Lead Serial Number: 452570
Implantable Lead Serial Number: 480762
Implantable Pulse Generator Implant Date: 20180416
Lead Channel Impedance Value: 345 Ohm
Lead Channel Impedance Value: 406 Ohm
Lead Channel Pacing Threshold Amplitude: 0.4 V
Lead Channel Pacing Threshold Amplitude: 1 V
Lead Channel Pacing Threshold Pulse Width: 0.4 ms
Lead Channel Pacing Threshold Pulse Width: 0.4 ms
Lead Channel Setting Pacing Amplitude: 1.5 V
Lead Channel Setting Pacing Amplitude: 2 V
Lead Channel Setting Pacing Pulse Width: 0.4 ms
Lead Channel Setting Sensing Sensitivity: 2.5 mV
Pulse Gen Serial Number: 782681
Zone Setting Status: 755011

## 2021-12-08 ENCOUNTER — Encounter: Payer: Self-pay | Admitting: Hematology and Oncology

## 2022-01-04 NOTE — Progress Notes (Signed)
Remote pacemaker transmission.   

## 2022-02-08 NOTE — Progress Notes (Signed)
Cardiology Office Note Date:  02/09/2022  Patient ID:  Samantha, Scott 1940-03-26, MRN [id number] PCP:  Mayra Neer, MD  Electrophysiologist:  Dr. Lovena Le    Chief Complaint:   annual visit  History of Present Illness: [name] is a 82 y.o. female with history of hypothyroidism, HTN, HLD, CHB w/PPM, breast ca (s/p R lumpectomy, radioactive seed placement > Tamoxifen).  I saw her back in 2020 She feels quite well.  No CP, palpitations, SOB, no difficulties with ADLs, cares for her lawn, and recently getting back in her recumbent bike for exercise.  No near syncope or syncope. She follows labs with her PMD Device functioning well, no changes were made.  She last saw Dr. Lovena Le 01/17/21, doing well, some dietary liberalization at home, encouraged improvement in this and weight loss.  BP a little low though no symptoms, no changes were made, pending her visit with her PMD, to consider reducing amlodipine dose.  TODAY She is doing great No cardiac concerns at all No CP, palpitations or cardiac awareness, no SOB, DOE No dizzy spells, near syncope or syncope  Sees her PMD regularly, labs, lipids, monitored and managed there   Device information BSCi dual chamber PPM implanted 06/2005, gen change 06/23/16  PACER DEPENDENT    Past Medical History:  Diagnosis Date   Arthritis    Atrioventricular block, complete ([place])    Breast cancer ([place]) 04/2020   right breast IDC   Cardiac pacemaker in situ    CHB (complete heart block) ([place]) 2007   Essential hypertension, benign    Family history of breast cancer    Family history of kidney cancer    Family history of lung cancer    Family history of melanoma    Family history of prostate cancer    Family history of thyroid cancer    Frequency of urination    History of skin cancer    Hyperlipidemia    Hyperlipidemia    Hypothyroidism    Malignant melanoma ([place]) 03/12/2016   LEFT KNEE- TX WITH EXCISION    Postmenopausal    SCC (squamous cell carcinoma) 10/27/2014   SCC/KA-LEFT SHIN- TX CURET X 3,5FU   Thyroid disease    hypothyroid    Past Surgical History:  Procedure Laterality Date   ABDOMINAL HYSTERECTOMY     BLADDER REPAIR     BREAST LUMPECTOMY WITH RADIOACTIVE SEED LOCALIZATION Right 05/26/2020   Procedure: RIGHT BREAST LUMPECTOMY WITH RADIOACTIVE SEED LOCALIZATION;  Surgeon: Jovita Kussmaul, MD;  Location: [facility];  Service: General;  Laterality: Right;   COLONOSCOPY     KNEE ARTHROSCOPY     rt   PACEMAKER INSERTION  07-03-2005   [employer]    PPM GENERATOR [place] N/A 04/23/2016   Procedure: PPM Generator Changeout;  Surgeon: Evans Lance, MD;  Location: [facility];  Service: Cardiovascular;  Laterality: N/A;   TOTAL KNEE ARTHROPLASTY     right   TOTAL KNEE ARTHROPLASTY Left 03/14/2015   Procedure: TOTAL KNEE ARTHROPLASTY;  Surgeon: Gaynelle Arabian, MD;  [facility];  Service: Orthopedics;  Laterality: Left;    Current Outpatient Medications  Medication Sig Dispense Refill   amLODipine (NORVASC) 5 MG tablet Take 5 mg by mouth Daily.      aspirin EC 81 MG tablet Take 81 mg by mouth daily.     Cholecalciferol (VITAMIN D3 PO) Take 2,000 Units by mouth daily.     levothyroxine (SYNTHROID, LEVOTHROID)  100 MCG tablet Take 100 mcg by mouth daily.     lisinopril-hydrochlorothiazide (PRINZIDE,ZESTORETIC) 20-25 MG per tablet Take 1 tablet by mouth Daily.     pravastatin (PRAVACHOL) 40 MG tablet Take 40 mg by mouth Daily.      tamoxifen (NOLVADEX) 20 MG tablet Take 1 tablet (20 mg total) by mouth daily. 90 tablet 3   oxybutynin (DITROPAN) 5 MG tablet Take 5 mg by mouth at bedtime as needed for bladder spasms.     No current facility-administered medications for this visit.    Allergies:   Lipitor [atorvastatin]   Social History:  The patient  reports that she quit smoking about 35 years ago. Her smoking use included cigarettes. She has  never used smokeless tobacco. She reports current alcohol use. She reports that she does not use drugs.   Family History:  The patient's family history includes Breast cancer (age of onset: 40) in her sister; Heart attack in her father; Kidney cancer (age of onset: 13) in her sister; Lung cancer (age of onset: 48) in her mother; Melanoma (age of onset: 1) in her son; Prostate cancer (age of onset: 23) in her brother; Thyroid cancer in her niece.  ROS:  Please see the history of present illness.  All other systems are reviewed and otherwise negative.   PHYSICAL EXAM:  VS:  BP 110/62   Pulse 66   Ht '5\' 3"'$  (1.6 m)   Wt 180 lb (81.6 kg)   SpO2 96%   BMI 31.89 kg/m  BMI: Body mass index is 31.89 kg/m. Well nourished, well developed, in no acute distress  HEENT: normocephalic, atraumatic  Neck: no JVD, carotid bruits or masses Cardiac:  RRR; no significant murmurs, no rubs, or gallops Lungs:  CTA b/l, no wheezing, rhonchi or rales  Abd: soft, nontender MS: no deformity or atrophy Ext:  no edema  Skin: warm and dry, no rash Neuro:  No gross deficits appreciated Psych: euthymic mood, full affect  PPM site is stable, no tethering or discomfort   EKG:  Done today and reviewed by myslf shows   SR/V paced 66bpm  PPM interrogation done today and reviewed by myself;  Battery and lead measurements are good No arrhythmias A few PMT labeled episodes though in reviw are not true PMT, more c/w ST Histograms look good   07/03/2005: TTE SUMMARY   -  Overall left ventricular systolic function was normal. Left         ventricular ejection fraction was estimated to be 60 %. There         were no left ventricular regional wall motion abnormalities.   -  There was mild mitral valvular regurgitation.   -  Mild pulmonary hypertension   Recent Labs: 04/28/2021: ALT 9; BUN 23; Creatinine 1.13; Hemoglobin 12.9; Platelet Count 183; Potassium 3.9; Sodium 140  No results found for requested labs  within last 365 days.   CrCl cannot be calculated (Patient's most recent lab result is older than the maximum 21 days allowed.).   Wt Readings from Last 3 Encounters:  02/09/22 180 lb (81.6 kg)  04/28/21 181 lb 8 oz (82.3 kg)  01/17/21 177 lb (80.3 kg)     Other studies reviewed: Additional studies/records reviewed today include: summarized above  ASSESSMENT AND PLAN:  1. PPM     Intact function     no programming changes made today     Dependent   2. HTN     Looks good  Disposition: remotes as usual, in clinic again in a year, sooner if needed    Current medicines are reviewed at length with the patient today.  The patient did not have any concerns regarding medicines.  Venetia Night, PA-C 02/09/2022 10:47 AM     [facility] [address] [place] [address] [phone] (office)  [phone] (fax)

## 2022-02-09 ENCOUNTER — Ambulatory Visit: Payer: PPO | Attending: Physician Assistant | Admitting: Physician Assistant

## 2022-02-09 ENCOUNTER — Encounter: Payer: Self-pay | Admitting: Physician Assistant

## 2022-02-09 VITALS — BP 110/62 | HR 66 | Ht 63.0 in | Wt 180.0 lb

## 2022-02-09 DIAGNOSIS — Z950 Presence of cardiac pacemaker: Secondary | ICD-10-CM

## 2022-02-09 DIAGNOSIS — I10 Essential (primary) hypertension: Secondary | ICD-10-CM

## 2022-02-09 DIAGNOSIS — I442 Atrioventricular block, complete: Secondary | ICD-10-CM | POA: Diagnosis not present

## 2022-02-09 LAB — CUP PACEART INCLINIC DEVICE CHECK
Date Time Interrogation Session: 20240202165318
Implantable Lead Connection Status: 753985
Implantable Lead Connection Status: 753985
Implantable Lead Implant Date: 20070626
Implantable Lead Implant Date: 20070626
Implantable Lead Location: 753859
Implantable Lead Location: 753860
Implantable Lead Model: 4456
Implantable Lead Model: 4469
Implantable Lead Serial Number: 452570
Implantable Lead Serial Number: 480762
Implantable Pulse Generator Implant Date: 20180416
Lead Channel Impedance Value: 369 Ohm
Lead Channel Impedance Value: 438 Ohm
Lead Channel Pacing Threshold Amplitude: 0.5 V
Lead Channel Pacing Threshold Pulse Width: 0.4 ms
Lead Channel Sensing Intrinsic Amplitude: 5 mV
Lead Channel Setting Pacing Amplitude: 1.6 V
Lead Channel Setting Pacing Amplitude: 2 V
Lead Channel Setting Pacing Pulse Width: 0.4 ms
Lead Channel Setting Sensing Sensitivity: 2.5 mV
Pulse Gen Serial Number: 782681
Zone Setting Status: 755011

## 2022-02-09 NOTE — Patient Instructions (Signed)
Medication Instructions:    Your physician recommends that you continue on your current medications as directed. Please refer to the Current Medication list given to you today.   *If you need a refill on your cardiac medications before your next appointment, please call your pharmacy*   Lab Work: NONE ORDERED  TODAY    If you have labs (blood work) drawn today and your tests are completely normal, you will receive your results only by: MyChart Message (if you have MyChart) OR A paper copy in the mail If you have any lab test that is abnormal or we need to change your treatment, we will call you to review the results.   Testing/Procedures: NONE ORDERED  TODAY     Follow-Up: At [facility], you and your health needs are our priority.  As part of our continuing mission to provide you with exceptional heart care, we have created designated Provider Care Teams.  These Care Teams include your primary Cardiologist (physician) and Advanced Practice Providers (APPs -  Physician Assistants and Nurse Practitioners) who all work together to provide you with the care you need, when you need it.  We recommend signing up for the patient portal called "MyChart".  Sign up information is provided on this After Visit Summary.  MyChart is used to connect with patients for Virtual Visits (Telemedicine).  Patients are able to view lab/test results, encounter notes, upcoming appointments, etc.  Non-urgent messages can be sent to your provider as well.   To learn more about what you can do with MyChart, go to [email or web address].    Your next appointment:   1 year(s)  Provider:   You may see Gregg Taylor, MD or one of the following Advanced Practice Providers on your designated Care Team:   Renee Ursuy, PA-C  Other Instructions   

## 2022-02-16 DIAGNOSIS — L570 Actinic keratosis: Secondary | ICD-10-CM | POA: Diagnosis not present

## 2022-02-16 DIAGNOSIS — L820 Inflamed seborrheic keratosis: Secondary | ICD-10-CM | POA: Diagnosis not present

## 2022-02-16 DIAGNOSIS — D485 Neoplasm of uncertain behavior of skin: Secondary | ICD-10-CM | POA: Diagnosis not present

## 2022-03-06 ENCOUNTER — Ambulatory Visit: Payer: PPO

## 2022-03-06 DIAGNOSIS — I442 Atrioventricular block, complete: Secondary | ICD-10-CM

## 2022-03-06 LAB — CUP PACEART REMOTE DEVICE CHECK
Battery Remaining Longevity: 114 mo
Battery Remaining Percentage: 100 %
Brady Statistic RA Percent Paced: 34 %
Brady Statistic RV Percent Paced: 98 %
Date Time Interrogation Session: 20240227050000
Implantable Lead Connection Status: 753985
Implantable Lead Connection Status: 753985
Implantable Lead Implant Date: 20070626
Implantable Lead Implant Date: 20070626
Implantable Lead Location: 753859
Implantable Lead Location: 753860
Implantable Lead Model: 4456
Implantable Lead Model: 4469
Implantable Lead Serial Number: 452570
Implantable Lead Serial Number: 480762
Implantable Pulse Generator Implant Date: 20180416
Lead Channel Impedance Value: 355 Ohm
Lead Channel Impedance Value: 438 Ohm
Lead Channel Pacing Threshold Amplitude: 0.4 V
Lead Channel Pacing Threshold Amplitude: 0.8 V
Lead Channel Pacing Threshold Pulse Width: 0.4 ms
Lead Channel Pacing Threshold Pulse Width: 0.4 ms
Lead Channel Setting Pacing Amplitude: 1.5 V
Lead Channel Setting Pacing Amplitude: 2 V
Lead Channel Setting Pacing Pulse Width: 0.4 ms
Lead Channel Setting Sensing Sensitivity: 2.5 mV
Pulse Gen Serial Number: 782681
Zone Setting Status: 755011

## 2022-03-08 ENCOUNTER — Telehealth: Payer: Self-pay | Admitting: Family Medicine

## 2022-03-08 NOTE — Telephone Encounter (Signed)
Per 2/29 IB reached out to patient to answer questions regarding appointment, left voicemail.

## 2022-04-11 NOTE — Progress Notes (Signed)
Remote pacemaker transmission.   

## 2022-04-30 ENCOUNTER — Encounter: Payer: Self-pay | Admitting: Hematology and Oncology

## 2022-04-30 ENCOUNTER — Other Ambulatory Visit: Payer: Self-pay

## 2022-04-30 ENCOUNTER — Inpatient Hospital Stay: Payer: PPO | Attending: Hematology and Oncology | Admitting: Hematology and Oncology

## 2022-04-30 VITALS — BP 125/64 | HR 75 | Temp 98.1°F | Resp 16 | Ht 63.0 in | Wt 177.2 lb

## 2022-04-30 DIAGNOSIS — Z170 Estrogen receptor positive status [ER+]: Secondary | ICD-10-CM | POA: Insufficient documentation

## 2022-04-30 DIAGNOSIS — Z803 Family history of malignant neoplasm of breast: Secondary | ICD-10-CM | POA: Insufficient documentation

## 2022-04-30 DIAGNOSIS — Z808 Family history of malignant neoplasm of other organs or systems: Secondary | ICD-10-CM | POA: Diagnosis not present

## 2022-04-30 DIAGNOSIS — Z87891 Personal history of nicotine dependence: Secondary | ICD-10-CM | POA: Diagnosis not present

## 2022-04-30 DIAGNOSIS — Z90710 Acquired absence of both cervix and uterus: Secondary | ICD-10-CM | POA: Insufficient documentation

## 2022-04-30 DIAGNOSIS — Z79810 Long term (current) use of selective estrogen receptor modulators (SERMs): Secondary | ICD-10-CM | POA: Insufficient documentation

## 2022-04-30 DIAGNOSIS — Z8042 Family history of malignant neoplasm of prostate: Secondary | ICD-10-CM | POA: Insufficient documentation

## 2022-04-30 DIAGNOSIS — Z801 Family history of malignant neoplasm of trachea, bronchus and lung: Secondary | ICD-10-CM | POA: Diagnosis not present

## 2022-04-30 DIAGNOSIS — Z8051 Family history of malignant neoplasm of kidney: Secondary | ICD-10-CM | POA: Diagnosis not present

## 2022-04-30 DIAGNOSIS — C50511 Malignant neoplasm of lower-outer quadrant of right female breast: Secondary | ICD-10-CM | POA: Diagnosis not present

## 2022-04-30 NOTE — Progress Notes (Signed)
[facility]  [phone] [phone]     ID: [name] DOB: [birth year]  MR#: [id number]  JYN#:[id number]  Patient Care Team: Samantha Raider, MD as PCP - General (Family Medicine) Samantha Maw, MD as PCP - Electrophysiology (Cardiology) Samantha Angelica, RN as Oncology Nurse Navigator Samantha Proud, RN as Oncology Nurse Navigator Samantha Miner, MD as Consulting Physician (General Surgery) Scott, Samantha Hue, MD (Inactive) as Consulting Physician (Oncology) Samantha Peak, MD as Attending Physician (Radiation Oncology) Samantha Dare, MD as Consulting Physician (Gastroenterology) Samantha Moulds, MD   CHIEF COMPLAINT: Estrogen receptor positive breast cancer  CURRENT TREATMENT: Tamoxifen   INTERVAL HISTORY:  [name] returns today for follow up of her estrogen receptor positive breast cancer. Patient has been tolerating tamoxifen very well.  She denies any side effects.  Since her last visit she had a mammogram in November and recommendation was to consider repeat in 6 months.  She denies any breast changes herself.  She walks every day.  Rest of the pertinent 10 point ROS reviewed and negative    COVID 19 VACCINATION STATUS: Moderna x2, refuses boosters; had COVID fall 2021   HISTORY OF CURRENT ILLNESS: From the original intake note:  [name] had routine screening mammography on 03/28/2020 showing a possible abnormality in the right breast. She underwent right diagnostic mammography with tomography and right breast ultrasonography at [facility] on 04/07/2020 showing: breast density category A; 0.5 cm round mass in right breast at 8 o'clock; no abnormal nodes in right axilla.  Accordingly on 04/20/2020 she proceeded to biopsy of the right breast area in question. The pathology from this procedure ([id number]) showed: invasive ductal carcinoma, grade 2; ductal carcinoma in situ. Prognostic indicators significant for: estrogen  receptor, >95% positive and progesterone receptor, >95% positive, both with strong staining intensity. Proliferation marker Ki67 at 10%. HER2 equivocal by immunohistochemistry (2+), but negative by fluorescent in situ hybridization with a signals ratio 1.32 and number per cell 1.85.   Cancer Staging  Malignant neoplasm of lower-outer quadrant of right breast of female, estrogen receptor positive Staging form: Breast, AJCC 8th Edition - Clinical stage from 04/27/2020: Stage IA (cT1a, cN0, cM0, G2, ER+, PR+, HER2-) - Signed by Lowella Dell, MD on 04/27/2020 Stage prefix: Initial diagnosis Histologic grading system: 3 grade system   The patient's subsequent history is as detailed below.   PAST MEDICAL HISTORY: Past Medical History:  Diagnosis Date   Arthritis    Atrioventricular block, complete (HCC)    Breast cancer (HCC) 04/2020   right breast IDC   Cardiac pacemaker in situ    CHB (complete heart block) (HCC) 2007   Essential hypertension, benign    Family history of breast cancer    Family history of kidney cancer    Family history of lung cancer    Family history of melanoma    Family history of prostate cancer    Family history of thyroid cancer    Frequency of urination    History of skin cancer    Hyperlipidemia    Hyperlipidemia    Hypothyroidism    Malignant melanoma (HCC) 03/12/2016   LEFT KNEE- TX WITH EXCISION   Postmenopausal    SCC (squamous cell carcinoma) 10/27/2014   SCC/KA-LEFT SHIN- TX CURET X 3,5FU   Thyroid disease    hypothyroid    PAST SURGICAL HISTORY: Past Surgical History:  Procedure Laterality Date   ABDOMINAL HYSTERECTOMY     BLADDER REPAIR  BREAST LUMPECTOMY WITH RADIOACTIVE SEED LOCALIZATION Right 05/26/2020   Procedure: RIGHT BREAST LUMPECTOMY WITH RADIOACTIVE SEED LOCALIZATION;  Surgeon: Samantha Miner, MD;  Location: [facility];  Service: General;  Laterality: Right;   COLONOSCOPY     KNEE ARTHROSCOPY     rt    PACEMAKER INSERTION  07-03-2005   [employer] Guidant    PPM GENERATOR [place] N/A 04/23/2016   Procedure: PPM Generator Changeout;  Surgeon: Samantha Maw, MD;  Location: [facility] INVASIVE CV LAB;  Service: Cardiovascular;  Laterality: N/A;   TOTAL KNEE ARTHROPLASTY     right   TOTAL KNEE ARTHROPLASTY Left 03/14/2015   Procedure: TOTAL KNEE ARTHROPLASTY;  Surgeon: Ollen Gross, MD;  Location: [facility];  Service: Orthopedics;  Laterality: Left;    FAMILY HISTORY: Family History  Problem Relation Age of Onset   Lung cancer Mother 47       smoker   Heart attack Father        pacemaker & defibrillator   Breast cancer Sister 51   Prostate cancer Brother 61   Kidney cancer Sister 2   Melanoma Son 54   Thyroid cancer Niece        dx 19s   Colon cancer Neg Hx    Esophageal cancer Neg Hx    Stomach cancer Neg Hx    Rectal cancer Neg Hx    Her father died at age 72 from CHI. Her mother died at age 74 from lung cancer, diagnosed at age 53. She was a smoker. [name] has 3 brothers and 4 sisters. Aside from her mother, she reports breast cancer in a sister at age 27 and prostate cancer in a brother.   GYNECOLOGIC HISTORY:  No LMP recorded. Patient has had a hysterectomy. Menarche: 82 years old Age at first live birth: 82 years old GX P 3 LMP age 32, with hysterectomy HRT took for 10 years hollowing hysterectomy  Hysterectomy? yes BSO? 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 She is widowed. She lives at home by herself. Son [name], age 73, works in Holiday representative in [place], [place]. Son [name], age 45, owns a landscape business here in [place]. Daughter [name], age 68, if a Emergency planning/management officer here in [place].  The patient's Sister [name] is also my patient.  [name] has 9 grandchildren (no "greats"). She attends [employer]. [employer]    ADVANCED DIRECTIVES: in place, son [name] is her HCPOA  and can be reached at [phone]   HEALTH MAINTENANCE: Social History   Tobacco Use   Smoking status: Former    Types: Cigarettes    Quit date: 03/06/1986    Years since quitting: 36.1   Smokeless tobacco: Never  Vaping Use   Vaping Use: Never used  Substance Use Topics   Alcohol use: Yes    Comment: wine socially   Drug use: No     Colonoscopy: 01/2020 (Dr. Russella Dar), repeat not indicated due to age  PAP: date unknown  Bone density: none on file   Allergies  Allergen Reactions   Lipitor [Atorvastatin] Other (See Comments)    Muscle aches    Current Outpatient Medications  Medication Sig Dispense Refill   amLODipine (NORVASC) 5 MG tablet Take 5 mg by mouth Daily.      aspirin EC 81 MG tablet Take 81 mg by mouth daily.     Cholecalciferol (VITAMIN D3 PO) Take 2,000  Units by mouth daily.     levothyroxine (SYNTHROID, LEVOTHROID) 100 MCG tablet Take 100 mcg by mouth daily.     lisinopril-hydrochlorothiazide (PRINZIDE,ZESTORETIC) 20-25 MG per tablet Take 1 tablet by mouth Daily.     oxybutynin (DITROPAN) 5 MG tablet Take 5 mg by mouth at bedtime as needed for bladder spasms.     pravastatin (PRAVACHOL) 40 MG tablet Take 40 mg by mouth Daily.      tamoxifen (NOLVADEX) 20 MG tablet Take 1 tablet (20 mg total) by mouth daily. 90 tablet 3   No current facility-administered medications for this visit.    OBJECTIVE: White woman who appears stated age  Vitals:   04/30/22 0928  BP: 125/64  Pulse: 75  Resp: 16  Temp: 98.1 F (36.7 C)  SpO2: 98%       Body mass index is 31.39 kg/m.   Wt Readings from Last 3 Encounters:  04/30/22 177 lb 3.2 oz (80.4 kg)  02/09/22 180 lb (81.6 kg)  04/28/21 181 lb 8 oz (82.3 kg)      ECOG FS:1 - Symptomatic but completely ambulatory  Physical Exam Constitutional:      Appearance: Normal appearance.  Cardiovascular:     Rate and Rhythm: Normal rate and regular rhythm.  Chest:     Comments: Bilateral breasts inspected.  She is  status post right lumpectomy, no palpable masses or regional adenopathy.  Excellent cosmetic outcome Musculoskeletal:     Cervical back: Normal range of motion and neck supple. No rigidity.  Lymphadenopathy:     Cervical: No cervical adenopathy.  Skin:    General: Skin is warm and dry.  Neurological:     General: No focal deficit present.     Mental Status: She is alert.  Psychiatric:        Mood and Affect: Mood normal.     LAB RESULTS:  CMP     Component Value Date/Time   NA 140 04/28/2021 0915   NA 140 04/18/2016 1035   K 3.9 04/28/2021 0915   CL 107 04/28/2021 0915   CO2 28 04/28/2021 0915   GLUCOSE 102 (H) 04/28/2021 0915   BUN 23 04/28/2021 0915   BUN 25 04/18/2016 1035   CREATININE 1.13 (H) 04/28/2021 0915   CALCIUM 9.3 04/28/2021 0915   PROT 6.9 04/28/2021 0915   ALBUMIN 3.9 04/28/2021 0915   AST 15 04/28/2021 0915   ALT 9 04/28/2021 0915   ALKPHOS 43 04/28/2021 0915   BILITOT 0.4 04/28/2021 0915   GFRNONAA 49 (L) 04/28/2021 0915   GFRAA 65 04/18/2016 1035    No results found for: "TOTALPROTELP", "ALBUMINELP", "A1GS", "A2GS", "BETS", "BETA2SER", "GAMS", "MSPIKE", "SPEI"  Lab Results  Component Value Date   WBC 6.0 04/28/2021   NEUTROABS 3.3 04/28/2021   HGB 12.9 04/28/2021   HCT 40.4 04/28/2021   MCV 91.2 04/28/2021   PLT 183 04/28/2021    No results found for: "LABCA2"  No components found for: "ZOXWRU045"  No results for input(s): "INR" in the last 168 hours.  No results found for: "LABCA2"  No results found for: "WUJ811"  No results found for: "CAN125"  No results found for: "CAN153"  No results found for: "CA2729"  No components found for: "HGQUANT"  No results found for: "CEA1", "CEA" / No results found for: "CEA1", "CEA"   No results found for: "AFPTUMOR"  No results found for: "CHROMOGRNA"  No results found for: "KPAFRELGTCHN", "LAMBDASER", "KAPLAMBRATIO" (kappa/lambda light chains)  No results found for: "HGBA",  "  HGBA2QUANT", "HGBFQUANT", "HGBSQUAN" (Hemoglobinopathy evaluation)   No results found for: "LDH"  No results found for: "IRON", "TIBC", "IRONPCTSAT" (Iron and TIBC)  No results found for: "FERRITIN"  Urinalysis    Component Value Date/Time   COLORURINE YELLOW 03/07/2015 1033   APPEARANCEUR CLEAR 03/07/2015 1033   LABSPEC 1.006 03/07/2015 1033   PHURINE 7.0 03/07/2015 1033   GLUCOSEU NEGATIVE 03/07/2015 1033   HGBUR NEGATIVE 03/07/2015 1033   BILIRUBINUR NEGATIVE 03/07/2015 1033   KETONESUR NEGATIVE 03/07/2015 1033   PROTEINUR NEGATIVE 03/07/2015 1033   UROBILINOGEN 0.2 10/27/2010 0945   NITRITE NEGATIVE 03/07/2015 1033   LEUKOCYTESUR TRACE (A) 03/07/2015 1033    STUDIES: No results found.   ELIGIBLE FOR AVAILABLE RESEARCH PROTOCOL: no  ASSESSMENT: 82 y.o.  woman status post right breast lower outer quadrant biopsy 04/20/2020 for a clinical T1a N0, stage IA invasive ductal carcinoma, grade 2, estrogen and progesterone receptor positive, HER2 not amplified, with an MIB-1 of 10%.  (1) genetics testing 05/08/2020 through the [facility]-Expanded + RNAinsight panel found no deleterious mutations in AIP, ALK, APC, ATM, AXIN2, BAP1, BARD1, BLM, BMPR1A, BRCA1, BRCA2, BRIP1, CDC73, CDH1, CDK4, CDKN1B, CDKN2A, CHEK2, CTNNA1, DICER1, FANCC, FH, FLCN, GALNT12, KIF1B, LZTR1, MAX, MEN1, MET, MLH1, MSH2, MSH3, MSH6, MUTYH, NBN, NF1, NF2, NTHL1, PALB2, PHOX2B, PMS2, POT1, PRKAR1A, PTCH1, PTEN, RAD51C, RAD51D, RB1, RECQL, RET, SDHA, SDHAF2, SDHB, SDHC, SDHD, SMAD4, SMARCA4, SMARCB1, SMARCE1, STK11, SUFU, TMEM127, TP53, TSC1, TSC2, VHL and XRCC2 (sequencing and deletion/duplication); EGFR, EGLN1, HOXB13, KIT, MITF, PDGFRA, POLD1 and POLE (sequencing only); EPCAM and GREM1 (deletion/duplication only). RNA data is routinely analyzed for use in variant interpretation for all genes.  (2) right lumpectomy 05/26/2020 showed a pT1b pNX, stage IA invasive ductal carcinoma, grade 2, with  negative margins  (3) no adjuvant radiation (see Dr. Karoline Caldwell 05/10/2020 note).  (4) tamoxifen started 07/05/2020  (A) bone density  (B) s/p hysterectomy  (C) s/p hormone replacement >10 years w/o complications   PLAN:  [name] is tolerating tamoxifen very well.  She denies any new health complaints. No concerning breast changes reported.  Physical examination today unremarkable. She is already scheduled for repeat mammogram in early May.  She denies any side effects from tamoxifen and would like to continue annual follow-up at this time. She was encouraged to contact us with any new questions or concerns.  Total time spent: 20 minutes   *Total Encounter Time as defined by the Centers for Medicare and Medicaid Services includes, in addition to the face-to-face time of a patient visit (documented in the note above) non-face-to-face time: obtaining and reviewing outside history, ordering and reviewing medications, tests or procedures, care coordination (communications with other health care professionals or caregivers) and documentation in the medical record.

## 2022-05-15 DIAGNOSIS — R928 Other abnormal and inconclusive findings on diagnostic imaging of breast: Secondary | ICD-10-CM | POA: Diagnosis not present

## 2022-05-22 DIAGNOSIS — L814 Other melanin hyperpigmentation: Secondary | ICD-10-CM | POA: Diagnosis not present

## 2022-05-22 DIAGNOSIS — L821 Other seborrheic keratosis: Secondary | ICD-10-CM | POA: Diagnosis not present

## 2022-05-22 DIAGNOSIS — L570 Actinic keratosis: Secondary | ICD-10-CM | POA: Diagnosis not present

## 2022-05-22 DIAGNOSIS — D485 Neoplasm of uncertain behavior of skin: Secondary | ICD-10-CM | POA: Diagnosis not present

## 2022-05-22 DIAGNOSIS — D225 Melanocytic nevi of trunk: Secondary | ICD-10-CM | POA: Diagnosis not present

## 2022-05-23 ENCOUNTER — Encounter: Payer: Self-pay | Admitting: Hematology and Oncology

## 2022-06-05 ENCOUNTER — Ambulatory Visit: Payer: PPO

## 2022-06-05 DIAGNOSIS — I442 Atrioventricular block, complete: Secondary | ICD-10-CM

## 2022-06-06 LAB — CUP PACEART REMOTE DEVICE CHECK
Battery Remaining Longevity: 108 mo
Battery Remaining Percentage: 100 %
Brady Statistic RA Percent Paced: 29 %
Brady Statistic RV Percent Paced: 98 %
Date Time Interrogation Session: 20240528050100
Implantable Lead Connection Status: 753985
Implantable Lead Connection Status: 753985
Implantable Lead Implant Date: 20070626
Implantable Lead Implant Date: 20070626
Implantable Lead Location: 753859
Implantable Lead Location: 753860
Implantable Lead Model: 4456
Implantable Lead Model: 4469
Implantable Lead Serial Number: 452570
Implantable Lead Serial Number: 480762
Implantable Pulse Generator Implant Date: 20180416
Lead Channel Impedance Value: 334 Ohm
Lead Channel Impedance Value: 403 Ohm
Lead Channel Pacing Threshold Amplitude: 0.4 V
Lead Channel Pacing Threshold Amplitude: 1 V
Lead Channel Pacing Threshold Pulse Width: 0.4 ms
Lead Channel Pacing Threshold Pulse Width: 0.4 ms
Lead Channel Setting Pacing Amplitude: 1.5 V
Lead Channel Setting Pacing Amplitude: 2 V
Lead Channel Setting Pacing Pulse Width: 0.4 ms
Lead Channel Setting Sensing Sensitivity: 2.5 mV
Pulse Gen Serial Number: 782681
Zone Setting Status: 755011

## 2022-06-21 ENCOUNTER — Other Ambulatory Visit: Payer: Self-pay | Admitting: Hematology and Oncology

## 2022-06-27 DIAGNOSIS — Z0000 Encounter for general adult medical examination without abnormal findings: Secondary | ICD-10-CM | POA: Diagnosis not present

## 2022-06-27 DIAGNOSIS — K5730 Diverticulosis of large intestine without perforation or abscess without bleeding: Secondary | ICD-10-CM | POA: Diagnosis not present

## 2022-06-27 DIAGNOSIS — R42 Dizziness and giddiness: Secondary | ICD-10-CM | POA: Diagnosis not present

## 2022-06-27 DIAGNOSIS — K648 Other hemorrhoids: Secondary | ICD-10-CM | POA: Diagnosis not present

## 2022-06-27 DIAGNOSIS — K625 Hemorrhage of anus and rectum: Secondary | ICD-10-CM | POA: Diagnosis not present

## 2022-06-27 DIAGNOSIS — R634 Abnormal weight loss: Secondary | ICD-10-CM | POA: Diagnosis not present

## 2022-06-29 NOTE — Progress Notes (Signed)
Remote pacemaker transmission.   

## 2022-07-23 DIAGNOSIS — E669 Obesity, unspecified: Secondary | ICD-10-CM | POA: Diagnosis not present

## 2022-07-23 DIAGNOSIS — E039 Hypothyroidism, unspecified: Secondary | ICD-10-CM | POA: Diagnosis not present

## 2022-07-23 DIAGNOSIS — I442 Atrioventricular block, complete: Secondary | ICD-10-CM | POA: Diagnosis not present

## 2022-07-23 DIAGNOSIS — Z950 Presence of cardiac pacemaker: Secondary | ICD-10-CM | POA: Diagnosis not present

## 2022-07-23 DIAGNOSIS — C50911 Malignant neoplasm of unspecified site of right female breast: Secondary | ICD-10-CM | POA: Diagnosis not present

## 2022-07-23 DIAGNOSIS — E782 Mixed hyperlipidemia: Secondary | ICD-10-CM | POA: Diagnosis not present

## 2022-07-23 DIAGNOSIS — N183 Chronic kidney disease, stage 3 unspecified: Secondary | ICD-10-CM | POA: Diagnosis not present

## 2022-07-23 DIAGNOSIS — E559 Vitamin D deficiency, unspecified: Secondary | ICD-10-CM | POA: Diagnosis not present

## 2022-07-23 DIAGNOSIS — M1990 Unspecified osteoarthritis, unspecified site: Secondary | ICD-10-CM | POA: Diagnosis not present

## 2022-07-23 DIAGNOSIS — Z0000 Encounter for general adult medical examination without abnormal findings: Secondary | ICD-10-CM | POA: Diagnosis not present

## 2022-07-23 DIAGNOSIS — D692 Other nonthrombocytopenic purpura: Secondary | ICD-10-CM | POA: Diagnosis not present

## 2022-07-23 DIAGNOSIS — I10 Essential (primary) hypertension: Secondary | ICD-10-CM | POA: Diagnosis not present

## 2022-09-04 ENCOUNTER — Ambulatory Visit: Payer: PPO

## 2022-09-17 DIAGNOSIS — E039 Hypothyroidism, unspecified: Secondary | ICD-10-CM | POA: Diagnosis not present

## 2022-09-30 DIAGNOSIS — T148XXA Other injury of unspecified body region, initial encounter: Secondary | ICD-10-CM | POA: Diagnosis not present

## 2022-09-30 DIAGNOSIS — L03116 Cellulitis of left lower limb: Secondary | ICD-10-CM | POA: Diagnosis not present

## 2022-10-01 ENCOUNTER — Ambulatory Visit (INDEPENDENT_AMBULATORY_CARE_PROVIDER_SITE_OTHER): Payer: PPO

## 2022-10-01 DIAGNOSIS — I442 Atrioventricular block, complete: Secondary | ICD-10-CM | POA: Diagnosis not present

## 2022-10-02 LAB — CUP PACEART REMOTE DEVICE CHECK
Battery Remaining Longevity: 102 mo
Battery Remaining Percentage: 100 %
Brady Statistic RA Percent Paced: 30 %
Brady Statistic RV Percent Paced: 98 %
Date Time Interrogation Session: 20240920141800
Implantable Lead Connection Status: 753985
Implantable Lead Connection Status: 753985
Implantable Lead Implant Date: 20070626
Implantable Lead Implant Date: 20070626
Implantable Lead Location: 753859
Implantable Lead Location: 753860
Implantable Lead Model: 4456
Implantable Lead Model: 4469
Implantable Lead Serial Number: 452570
Implantable Lead Serial Number: 480762
Implantable Pulse Generator Implant Date: 20180416
Lead Channel Impedance Value: 380 Ohm
Lead Channel Impedance Value: 468 Ohm
Lead Channel Pacing Threshold Amplitude: 0.4 V
Lead Channel Pacing Threshold Amplitude: 1.1 V
Lead Channel Pacing Threshold Pulse Width: 0.4 ms
Lead Channel Pacing Threshold Pulse Width: 0.4 ms
Lead Channel Setting Pacing Amplitude: 1.5 V
Lead Channel Setting Pacing Amplitude: 2 V
Lead Channel Setting Pacing Pulse Width: 0.4 ms
Lead Channel Setting Sensing Sensitivity: 2.5 mV
Pulse Gen Serial Number: 782681
Zone Setting Status: 755011

## 2022-10-15 NOTE — Progress Notes (Signed)
Remote pacemaker transmission.   

## 2022-10-26 DIAGNOSIS — N390 Urinary tract infection, site not specified: Secondary | ICD-10-CM | POA: Diagnosis not present

## 2022-10-31 DIAGNOSIS — E039 Hypothyroidism, unspecified: Secondary | ICD-10-CM | POA: Diagnosis not present

## 2022-11-20 DIAGNOSIS — R928 Other abnormal and inconclusive findings on diagnostic imaging of breast: Secondary | ICD-10-CM | POA: Diagnosis not present

## 2022-11-26 ENCOUNTER — Encounter: Payer: Self-pay | Admitting: Hematology and Oncology

## 2022-12-04 ENCOUNTER — Ambulatory Visit: Payer: PPO

## 2022-12-14 DIAGNOSIS — E039 Hypothyroidism, unspecified: Secondary | ICD-10-CM | POA: Diagnosis not present

## 2022-12-31 ENCOUNTER — Ambulatory Visit: Payer: PPO

## 2022-12-31 DIAGNOSIS — I442 Atrioventricular block, complete: Secondary | ICD-10-CM | POA: Diagnosis not present

## 2023-01-08 LAB — CUP PACEART REMOTE DEVICE CHECK
Battery Remaining Longevity: 102 mo
Battery Remaining Percentage: 96 %
Brady Statistic RA Percent Paced: 27 %
Brady Statistic RV Percent Paced: 98 %
Date Time Interrogation Session: 20241226091100
Implantable Lead Connection Status: 753985
Implantable Lead Connection Status: 753985
Implantable Lead Implant Date: 20070626
Implantable Lead Implant Date: 20070626
Implantable Lead Location: 753859
Implantable Lead Location: 753860
Implantable Lead Model: 4456
Implantable Lead Model: 4469
Implantable Lead Serial Number: 452570
Implantable Lead Serial Number: 480762
Implantable Pulse Generator Implant Date: 20180416
Lead Channel Impedance Value: 388 Ohm
Lead Channel Impedance Value: 460 Ohm
Lead Channel Pacing Threshold Amplitude: 0.4 V
Lead Channel Pacing Threshold Amplitude: 1 V
Lead Channel Pacing Threshold Pulse Width: 0.4 ms
Lead Channel Pacing Threshold Pulse Width: 0.4 ms
Lead Channel Setting Pacing Amplitude: 1.5 V
Lead Channel Setting Pacing Amplitude: 2 V
Lead Channel Setting Pacing Pulse Width: 0.4 ms
Lead Channel Setting Sensing Sensitivity: 2.5 mV
Pulse Gen Serial Number: 782681
Zone Setting Status: 755011

## 2023-02-04 ENCOUNTER — Ambulatory Visit: Payer: PPO | Attending: Internal Medicine | Admitting: Internal Medicine

## 2023-02-04 ENCOUNTER — Encounter: Payer: Self-pay | Admitting: Internal Medicine

## 2023-02-04 VITALS — BP 116/64 | Ht 63.0 in | Wt 174.4 lb

## 2023-02-04 DIAGNOSIS — I442 Atrioventricular block, complete: Secondary | ICD-10-CM | POA: Diagnosis not present

## 2023-02-04 NOTE — Patient Instructions (Signed)
Medication Instructions:  Your physician recommends that you continue on your current medications as directed. Please refer to the Current Medication list given to you today.  *If you need a refill on your cardiac medications before your next appointment, please call your pharmacy*  Lab Work: None ordered.  If you have labs (blood work) drawn today and your tests are completely normal, you will receive your results only by: MyChart Message (if you have MyChart) OR A paper copy in the mail If you have any lab test that is abnormal or we need to change your treatment, we will call you to review the results.  Testing/Procedures: None ordered.  Follow-Up: At [facility], you and your health needs are our priority.  As part of our continuing mission to provide you with exceptional heart care, we have created designated Provider Care Teams.  These Care Teams include your primary Cardiologist (physician) and Advanced Practice Providers (APPs -  Physician Assistants and Nurse Practitioners) who all work together to provide you with the care you need, when you need it.   Your next appointment:   1 year(s)  The format for your next appointment:   In Person  Provider:   Lewayne Bunting, MD{or one of the following Advanced Practice Providers on your designated Care Team:   Francis Dowse, [place] Casimiro Needle "Mardelle Matte" [place], [place] Earnest Rosier, NP  Remote monitoring is used to monitor your Pacemaker/ ICD from home. This monitoring reduces the number of office visits required to check your device to one time per year. It allows [place] to keep an eye on the functioning of your device to ensure it is working properly.   Important Information About Sugar

## 2023-02-04 NOTE — Progress Notes (Signed)
HPI [name] returns today for followup of her CHB and HTN. She is a pleasant 83 yo woman who undewent PPM insertion in 2018. She has done well in the interim. No chest pain or sob. No syncope. No edema. She admits to dietary indiscretion. She has minimal edema.   Allergies  Allergen Reactions   Lipitor [Atorvastatin] Other (See Comments)    Muscle aches     Current Outpatient Medications  Medication Sig Dispense Refill   amLODipine (NORVASC) 5 MG tablet Take 5 mg by mouth Daily.      aspirin EC 81 MG tablet Take 81 mg by mouth daily.     Cholecalciferol (VITAMIN D3 PO) Take 2,000 Units by mouth daily.     levothyroxine (SYNTHROID, LEVOTHROID) 100 MCG tablet Take 100 mcg by mouth daily.     lisinopril-hydrochlorothiazide (PRINZIDE,ZESTORETIC) 20-25 MG per tablet Take 1 tablet by mouth Daily.     oxybutynin (DITROPAN) 5 MG tablet Take 5 mg by mouth at bedtime as needed for bladder spasms.     pravastatin (PRAVACHOL) 40 MG tablet Take 40 mg by mouth Daily.      tamoxifen (NOLVADEX) 20 MG tablet Take 1 tablet by mouth once daily 90 tablet 3   No current facility-administered medications for this visit.     Past Medical History:  Diagnosis Date   Arthritis    Atrioventricular block, complete (HCC)    Breast cancer (HCC) 04/2020   right breast IDC   Cardiac pacemaker in situ    CHB (complete heart block) (HCC) 2007   Essential hypertension, benign    Family history of breast cancer    Family history of kidney cancer    Family history of lung cancer    Family history of melanoma    Family history of prostate cancer    Family history of thyroid cancer    Frequency of urination    History of skin cancer    Hyperlipidemia    Hyperlipidemia    Hypothyroidism    Malignant melanoma (HCC) 03/12/2016   LEFT KNEE- TX WITH EXCISION   Postmenopausal    SCC (squamous cell carcinoma) 10/27/2014   SCC/KA-LEFT SHIN- TX CURET X 3,5FU   Thyroid disease    hypothyroid     ROS:   All systems reviewed and negative except as noted in the HPI.   Past Surgical History:  Procedure Laterality Date   ABDOMINAL HYSTERECTOMY     BLADDER REPAIR     BREAST LUMPECTOMY WITH RADIOACTIVE SEED LOCALIZATION Right 05/26/2020   Procedure: RIGHT BREAST LUMPECTOMY WITH RADIOACTIVE SEED LOCALIZATION;  Surgeon: Griselda Miner, MD;  Location: [facility];  Service: General;  Laterality: Right;   COLONOSCOPY     KNEE ARTHROSCOPY     rt   PACEMAKER INSERTION  07-03-2005   [employer] Guidant    PPM GENERATOR [place] N/A 04/23/2016   Procedure: PPM Generator Changeout;  Surgeon: Marinus Maw, MD;  Location: [facility] INVASIVE CV LAB;  Service: Cardiovascular;  Laterality: N/A;   TOTAL KNEE ARTHROPLASTY     right   TOTAL KNEE ARTHROPLASTY Left 03/14/2015   Procedure: TOTAL KNEE ARTHROPLASTY;  Surgeon: Ollen Gross, MD;  Location: [facility];  Service: Orthopedics;  Laterality: Left;     Family History  Problem Relation Age of Onset   Lung cancer Mother 50       smoker   Heart attack Father        pacemaker &  defibrillator   Breast cancer Sister 59   Prostate cancer Brother 81   Kidney cancer Sister 47   Melanoma Son 61   Thyroid cancer Niece        dx 71s   Colon cancer Neg Hx    Esophageal cancer Neg Hx    Stomach cancer Neg Hx    Rectal cancer Neg Hx      Social History   Socioeconomic History   Marital status: Widowed    Spouse name: Not on file   Number of children: Not on file   Years of education: Not on file   Highest education level: Not on file  Occupational History   Occupation: retired  Tobacco Use   Smoking status: Former    Current packs/day: 0.00    Types: Cigarettes    Quit date: 03/06/1986    Years since quitting: 36.9   Smokeless tobacco: Never  Vaping Use   Vaping status: Never Used  Substance and Sexual Activity   Alcohol use: Yes    Comment: wine socially   Drug use: No   Sexual activity: Not Currently    Birth  control/protection: Surgical  Other Topics Concern   Not on file  Social History Narrative   Not on file   Social Drivers of Health   Financial Resource Strain: Not on file  Food Insecurity: Not on file  Transportation Needs: Not on file  Physical Activity: Not on file  Stress: Not on file  Social Connections: Not on file  Intimate Partner Violence: Not on file     BP 116/64   Ht 5\' 3"  (1.6 m)   Wt 174 lb 6.4 oz (79.1 kg)   SpO2 96%   BMI 30.89 kg/m   Physical Exam:  Well appearing NAD HEENT: Unremarkable Neck:  No JVD, no thyromegally Lymphatics:  No adenopathy Back:  No CVA tenderness Lungs:  Clear with no wheezes HEART:  Regular rate rhythm, no murmurs, no rubs, no clicks Abd:  soft, positive bowel sounds, no organomegally, no rebound, no guarding Ext:  2 plus pulses, no edema, no cyanosis, no clubbing Skin:  No rashes no nodules Neuro:  CN II through XII intact, motor grossly intact  EKG - nsr with ventricular pacing  DEVICE  Normal device function.  See PaceArt for details.   Assess/Plan:  CHB - she is asymptomatic, s/p PPM insertion. 2. PPM - her [employer] DDD PM is working normally. No escape today. 3. Obesity - she is encouraged to lose weight. 4. HTN - her bp is well controlled today. No change in meds.l    [name] Samantha Empson,MD

## 2023-02-07 NOTE — Progress Notes (Signed)
Remote pacemaker transmission.

## 2023-02-07 NOTE — Addendum Note (Signed)
Addended by: Geralyn Flash D on: 02/07/2023 12:34 PM   Modules accepted: Orders

## 2023-03-05 ENCOUNTER — Ambulatory Visit: Payer: PPO

## 2023-04-01 ENCOUNTER — Ambulatory Visit (INDEPENDENT_AMBULATORY_CARE_PROVIDER_SITE_OTHER): Payer: PPO

## 2023-04-01 DIAGNOSIS — I442 Atrioventricular block, complete: Secondary | ICD-10-CM | POA: Diagnosis not present

## 2023-04-03 LAB — CUP PACEART REMOTE DEVICE CHECK
Battery Remaining Longevity: 96 mo
Battery Remaining Percentage: 94 %
Brady Statistic RA Percent Paced: 19 %
Brady Statistic RV Percent Paced: 97 %
Date Time Interrogation Session: 20250325103500
Implantable Lead Connection Status: 753985
Implantable Lead Connection Status: 753985
Implantable Lead Implant Date: 20070626
Implantable Lead Implant Date: 20070626
Implantable Lead Location: 753859
Implantable Lead Location: 753860
Implantable Lead Model: 4456
Implantable Lead Model: 4469
Implantable Lead Serial Number: 452570
Implantable Lead Serial Number: 480762
Implantable Pulse Generator Implant Date: 20180416
Lead Channel Impedance Value: 332 Ohm
Lead Channel Impedance Value: 397 Ohm
Lead Channel Pacing Threshold Amplitude: 0.4 V
Lead Channel Pacing Threshold Amplitude: 1 V
Lead Channel Pacing Threshold Pulse Width: 0.4 ms
Lead Channel Pacing Threshold Pulse Width: 0.4 ms
Lead Channel Setting Pacing Amplitude: 1.6 V
Lead Channel Setting Pacing Amplitude: 2 V
Lead Channel Setting Pacing Pulse Width: 0.4 ms
Lead Channel Setting Sensing Sensitivity: 2.5 mV
Pulse Gen Serial Number: 782681
Zone Setting Status: 755011

## 2023-04-04 ENCOUNTER — Encounter: Payer: Self-pay | Admitting: Internal Medicine

## 2023-04-30 DIAGNOSIS — H25043 Posterior subcapsular polar age-related cataract, bilateral: Secondary | ICD-10-CM | POA: Diagnosis not present

## 2023-04-30 DIAGNOSIS — H2513 Age-related nuclear cataract, bilateral: Secondary | ICD-10-CM | POA: Diagnosis not present

## 2023-04-30 DIAGNOSIS — H25013 Cortical age-related cataract, bilateral: Secondary | ICD-10-CM | POA: Diagnosis not present

## 2023-04-30 DIAGNOSIS — H18413 Arcus senilis, bilateral: Secondary | ICD-10-CM | POA: Diagnosis not present

## 2023-04-30 DIAGNOSIS — H2512 Age-related nuclear cataract, left eye: Secondary | ICD-10-CM | POA: Diagnosis not present

## 2023-05-01 ENCOUNTER — Telehealth: Payer: Self-pay

## 2023-05-01 NOTE — Progress Notes (Unsigned)
 [facility]  [phone] [phone]     ID: [name] DOB: 04-04-1940  MR#: [id number]  JYN#:[id number]  Patient Care Team: Samantha Landau, MD as PCP - General (Family Medicine) Samantha Fall, MD as PCP - Electrophysiology (Cardiology) Samantha Hsu, RN as Oncology Nurse Navigator Samantha Bo, RN as Oncology Nurse Navigator Samantha Chandler, MD as Consulting Physician (General Surgery) Magrinat, Samantha Cornfield, MD (Inactive) as Consulting Physician (Oncology) Samantha Dawes, MD as Attending Physician (Radiation Oncology) Samantha Blacksmith, MD (Inactive) as Consulting Physician (Gastroenterology) Samantha Arms, MD   CHIEF COMPLAINT: Estrogen receptor positive breast cancer  CURRENT TREATMENT: Tamoxifen    INTERVAL HISTORY:  [name] returns today for follow up of her estrogen receptor positive breast cancer on tamoxifen .  Rest of the pertinent 10 point ROS reviewed and negative    COVID 19 VACCINATION STATUS: Moderna x2, refuses boosters; had COVID Scott 2021   HISTORY OF CURRENT ILLNESS: From the original intake note:  [name] had routine screening mammography on 03/28/2020 showing a possible abnormality in the right breast. She underwent right diagnostic mammography with tomography and right breast ultrasonography at [facility] on 04/07/2020 showing: breast density category A; 0.5 cm round mass in right breast at 8 o'clock; no abnormal nodes in right axilla.  Accordingly on 04/20/2020 she proceeded to biopsy of the right breast area in question. The pathology from this procedure ([id number]) showed: invasive ductal carcinoma, grade 2; ductal carcinoma in situ. Prognostic indicators significant for: estrogen receptor, >95% positive and progesterone receptor, >95% positive, both with strong staining intensity. Proliferation marker Ki67 at 10%. HER2 equivocal by immunohistochemistry (2+), but negative by fluorescent in situ  hybridization with a signals ratio 1.32 and number per cell 1.85.   Cancer Staging  Malignant neoplasm of lower-outer quadrant of right breast of female, estrogen receptor positive (HCC) Staging form: Breast, AJCC 8th Edition - Clinical stage from 04/27/2020: Stage IA (cT1a, cN0, cM0, G2, ER+, PR+, HER2-) - Signed by Willy Harvest, MD on 04/27/2020 Stage prefix: Initial diagnosis Histologic grading system: 3 grade system   The patient's subsequent history is as detailed below.   PAST MEDICAL HISTORY: Past Medical History:  Diagnosis Date   Arthritis    Atrioventricular block, complete (HCC)    Breast cancer (HCC) 04/2020   right breast IDC   Cardiac pacemaker in situ    CHB (complete heart block) (HCC) 2007   Essential hypertension, benign    Family history of breast cancer    Family history of kidney cancer    Family history of lung cancer    Family history of melanoma    Family history of prostate cancer    Family history of thyroid  cancer    Frequency of urination    History of skin cancer    Hyperlipidemia    Hyperlipidemia    Hypothyroidism    Malignant melanoma (HCC) 03/12/2016   LEFT KNEE- TX WITH EXCISION   Postmenopausal    SCC (squamous cell carcinoma) 10/27/2014   SCC/KA-LEFT SHIN- TX CURET X 3,5FU   Thyroid  disease    hypothyroid    PAST SURGICAL HISTORY: Past Surgical History:  Procedure Laterality Date   ABDOMINAL HYSTERECTOMY     BLADDER REPAIR     BREAST LUMPECTOMY WITH RADIOACTIVE SEED LOCALIZATION Right 05/26/2020   Procedure: RIGHT BREAST LUMPECTOMY WITH RADIOACTIVE SEED LOCALIZATION;  Surgeon: Samantha Chandler, MD;  Location: [facility];  Service: General;  Laterality: Right;  COLONOSCOPY     KNEE ARTHROSCOPY     rt   PACEMAKER INSERTION  07-03-2005   [employer] Guidant    PPM GENERATOR [name] 04/23/2016   Procedure: PPM Generator Changeout;  Surgeon: Samantha Fall, MD;  Location: [facility];  Service:  Cardiovascular;  Laterality: Scott;   TOTAL KNEE ARTHROPLASTY     right   TOTAL KNEE ARTHROPLASTY Left 03/14/2015   Procedure: TOTAL KNEE ARTHROPLASTY;  Surgeon: Liliane Rei, MD;  Location: [facility];  Service: Orthopedics;  Laterality: Left;    FAMILY HISTORY: Family History  Problem Relation Age of Onset   Lung cancer Mother 16       smoker   Heart attack Father        pacemaker & defibrillator   Breast cancer Sister 23   Prostate cancer Brother 56   Kidney cancer Sister 8   Melanoma Son 38   Thyroid  cancer Niece        dx 46s   Colon cancer Neg Hx    Esophageal cancer Neg Hx    Stomach cancer Neg Hx    Rectal cancer Neg Hx    Her father died at age 65 from CHI. Her mother died at age 31 from lung cancer, diagnosed at age 28. She was a smoker. [name] has 3 brothers and 4 sisters. Aside from her mother, she reports breast cancer in a sister at age 24 and prostate cancer in a brother.   GYNECOLOGIC HISTORY:  No LMP recorded. Patient has had a hysterectomy. Menarche: 83 years old Age at first live birth: 83 years old GX P 3 LMP age 27, with hysterectomy HRT took for 10 years hollowing hysterectomy  Hysterectomy? yes BSO? 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 She is widowed. She lives at home by herself. Son [name], age 6, works in Holiday representative in [place], [place]. Son [name], age 72, owns a landscape business here in [place]. Daughter [name], age 40, if a Emergency planning/management officer here in [place].  The patient's Sister [name] is also my patient.  [name] has 9 grandchildren (no "greats"). She attends [employer]. [employer]    ADVANCED DIRECTIVES: in place, son [name] is her HCPOA and can be reached at [phone]   HEALTH MAINTENANCE: Social History   Tobacco Use   Smoking status: Former    Current packs/day: 0.00    Types: Cigarettes    Quit date: 03/06/1986    Years since  quitting: 37.1   Smokeless tobacco: Never  Vaping Use   Vaping status: Never Used  Substance Use Topics   Alcohol use: Yes    Comment: wine socially   Drug use: No     Colonoscopy: 01/2020 (Dr. Sandrea Cruel), repeat not indicated due to age  PAP: date unknown  Bone density: none on file   Allergies  Allergen Reactions   Lipitor [Atorvastatin] Other (See Comments)    Muscle aches    Current Outpatient Medications  Medication Sig Dispense Refill   amLODipine  (NORVASC ) 5 MG tablet Take 5 mg by mouth Daily.      aspirin EC 81 MG tablet Take 81 mg by mouth daily.     Cholecalciferol (VITAMIN D3 PO) Take 2,000 Units by mouth daily.     levothyroxine  (SYNTHROID , LEVOTHROID) 100 MCG tablet Take 100 mcg by mouth daily.     lisinopril-hydrochlorothiazide (PRINZIDE,ZESTORETIC) 20-25 MG per tablet Take 1  tablet by mouth Daily.     oxybutynin (DITROPAN) 5 MG tablet Take 5 mg by mouth at bedtime as needed for bladder spasms.     pravastatin  (PRAVACHOL ) 40 MG tablet Take 40 mg by mouth Daily.      tamoxifen  (NOLVADEX ) 20 MG tablet Take 1 tablet by mouth once daily 90 tablet 3   No current facility-administered medications for this visit.    OBJECTIVE: White woman who appears stated age  There were no vitals filed for this visit.      There is no height or weight on file to calculate BMI.   Wt Readings from Last 3 Encounters:  02/04/23 174 lb 6.4 oz (79.1 kg)  04/30/22 177 lb 3.2 oz (80.4 kg)  02/09/22 180 lb (81.6 kg)      ECOG FS:1 - Symptomatic but completely ambulatory  Physical Exam Constitutional:      Appearance: Normal appearance.  Cardiovascular:     Rate and Rhythm: Normal rate and regular rhythm.  Chest:     Comments: Bilateral breasts inspected.  She is status post right lumpectomy, no palpable masses or regional adenopathy.  Excellent cosmetic outcome Musculoskeletal:     Cervical back: Normal range of motion and neck supple. No rigidity.  Lymphadenopathy:     Cervical:  No cervical adenopathy.  Skin:    General: Skin is warm and dry.  Neurological:     General: No focal deficit present.     Mental Status: She is alert.  Psychiatric:        Mood and Affect: Mood normal.     LAB RESULTS:  CMP     Component Value Date/Time   NA 140 04/28/2021 0915   NA 140 04/18/2016 1035   K 3.9 04/28/2021 0915   CL 107 04/28/2021 0915   CO2 28 04/28/2021 0915   GLUCOSE 102 (H) 04/28/2021 0915   BUN 23 04/28/2021 0915   BUN 25 04/18/2016 1035   CREATININE 1.13 (H) 04/28/2021 0915   CALCIUM 9.3 04/28/2021 0915   PROT 6.9 04/28/2021 0915   ALBUMIN 3.9 04/28/2021 0915   AST 15 04/28/2021 0915   ALT 9 04/28/2021 0915   ALKPHOS 43 04/28/2021 0915   BILITOT 0.4 04/28/2021 0915   GFRNONAA 49 (L) 04/28/2021 0915   GFRAA 65 04/18/2016 1035    No results found for: "TOTALPROTELP", "ALBUMINELP", "A1GS", "A2GS", "BETS", "BETA2SER", "GAMS", "MSPIKE", "SPEI"  Lab Results  Component Value Date   WBC 6.0 04/28/2021   NEUTROABS 3.3 04/28/2021   HGB 12.9 04/28/2021   HCT 40.4 04/28/2021   MCV 91.2 04/28/2021   PLT 183 04/28/2021    No results found for: "LABCA2"  No components found for: "WUJWJX914"  No results for input(s): "INR" in the last 168 hours.  No results found for: "LABCA2"  No results found for: "NWG956"  No results found for: "CAN125"  No results found for: "CAN153"  No results found for: "CA2729"  No components found for: "HGQUANT"  No results found for: "CEA1", "CEA" / No results found for: "CEA1", "CEA"   No results found for: "AFPTUMOR"  No results found for: "CHROMOGRNA"  No results found for: "KPAFRELGTCHN", "LAMBDASER", "KAPLAMBRATIO" (kappa/lambda light chains)  No results found for: "HGBA", "HGBA2QUANT", "HGBFQUANT", "HGBSQUAN" (Hemoglobinopathy evaluation)   No results found for: "LDH"  No results found for: "IRON", "TIBC", "IRONPCTSAT" (Iron and TIBC)  No results found for: "FERRITIN"  Urinalysis    Component  Value Date/Time   COLORURINE YELLOW 03/07/2015 1033  APPEARANCEUR CLEAR 03/07/2015 1033   LABSPEC 1.006 03/07/2015 1033   PHURINE 7.0 03/07/2015 1033   GLUCOSEU NEGATIVE 03/07/2015 1033   HGBUR NEGATIVE 03/07/2015 1033   BILIRUBINUR NEGATIVE 03/07/2015 1033   KETONESUR NEGATIVE 03/07/2015 1033   PROTEINUR NEGATIVE 03/07/2015 1033   UROBILINOGEN 0.2 10/27/2010 0945   NITRITE NEGATIVE 03/07/2015 1033   LEUKOCYTESUR TRACE (A) 03/07/2015 1033    STUDIES: CUP PACEART REMOTE DEVICE CHECK Result Date: 04/03/2023 Scheduled remote reviewed. Normal device function.  Presenting rhythm:  AS/VP Next remote 91 days. LA, CVRS    ELIGIBLE FOR AVAILABLE RESEARCH PROTOCOL: no  ASSESSMENT: 83 y.o. [place] woman status post right breast lower outer quadrant biopsy 04/20/2020 for a clinical T1a N0, stage IA invasive ductal carcinoma, grade 2, estrogen and progesterone receptor positive, HER2 not amplified, with an MIB-1 of 10%.  (1) genetics testing 05/08/2020 through the [facility]Expanded + RNAinsight panel found no deleterious mutations in AIP, ALK, APC, ATM, AXIN2, BAP1, BARD1, BLM, BMPR1A, BRCA1, BRCA2, BRIP1, CDC73, CDH1, CDK4, CDKN1B, CDKN2A, CHEK2, CTNNA1, DICER1, FANCC, FH, FLCN, GALNT12, KIF1B, LZTR1, MAX, MEN1, MET, MLH1, MSH2, MSH3, MSH6, MUTYH, NBN, NF1, NF2, NTHL1, PALB2, PHOX2B, PMS2, POT1, PRKAR1A, PTCH1, PTEN, RAD51C, RAD51D, RB1, RECQL, RET, SDHA, SDHAF2, SDHB, SDHC, SDHD, SMAD4, SMARCA4, SMARCB1, SMARCE1, STK11, SUFU, TMEM127, TP53, TSC1, TSC2, VHL and XRCC2 (sequencing and deletion/duplication); EGFR, EGLN1, HOXB13, KIT, MITF, PDGFRA, POLD1 and POLE (sequencing only); EPCAM and GREM1 (deletion/duplication only). RNA data is routinely analyzed for use in variant interpretation for all genes.  (2) right lumpectomy 05/26/2020 showed a pT1b pNX, stage IA invasive ductal carcinoma, grade 2, with negative margins  (3) no adjuvant radiation (see Dr. Arthea Bilis 05/10/2020 note).  (4)  tamoxifen  started 07/05/2020  (A) bone density  (B) s/p hysterectomy  (C) s/p hormone replacement >10 years w/o complications   PLAN:  Ms. Hiott is tolerating tamoxifen  very well.  Total time spent: 20 minutes   *Total Encounter Time as defined by the Centers for Medicare and Medicaid Services includes, in addition to the face-to-face time of a patient visit (documented in the note above) non-face-to-face time: obtaining and reviewing outside history, ordering and reviewing medications, tests or procedures, care coordination (communications with other health care professionals or caregivers) and documentation in the medical record.

## 2023-05-01 NOTE — Telephone Encounter (Signed)
 Spoke with patient and confirmed appointment on 05/02/23

## 2023-05-02 ENCOUNTER — Inpatient Hospital Stay

## 2023-05-02 ENCOUNTER — Inpatient Hospital Stay: Payer: PPO | Attending: Hematology and Oncology | Admitting: Hematology and Oncology

## 2023-05-02 ENCOUNTER — Encounter: Payer: Self-pay | Admitting: Hematology and Oncology

## 2023-05-02 VITALS — BP 143/65 | HR 68 | Temp 97.4°F | Resp 16 | Wt 175.4 lb

## 2023-05-02 DIAGNOSIS — Z8051 Family history of malignant neoplasm of kidney: Secondary | ICD-10-CM | POA: Insufficient documentation

## 2023-05-02 DIAGNOSIS — C50511 Malignant neoplasm of lower-outer quadrant of right female breast: Secondary | ICD-10-CM | POA: Insufficient documentation

## 2023-05-02 DIAGNOSIS — Z1721 Progesterone receptor positive status: Secondary | ICD-10-CM | POA: Insufficient documentation

## 2023-05-02 DIAGNOSIS — Z8042 Family history of malignant neoplasm of prostate: Secondary | ICD-10-CM | POA: Diagnosis not present

## 2023-05-02 DIAGNOSIS — Z803 Family history of malignant neoplasm of breast: Secondary | ICD-10-CM | POA: Insufficient documentation

## 2023-05-02 DIAGNOSIS — Z808 Family history of malignant neoplasm of other organs or systems: Secondary | ICD-10-CM | POA: Diagnosis not present

## 2023-05-02 DIAGNOSIS — Z79810 Long term (current) use of selective estrogen receptor modulators (SERMs): Secondary | ICD-10-CM | POA: Insufficient documentation

## 2023-05-02 DIAGNOSIS — Z90710 Acquired absence of both cervix and uterus: Secondary | ICD-10-CM | POA: Diagnosis not present

## 2023-05-02 DIAGNOSIS — Z87891 Personal history of nicotine dependence: Secondary | ICD-10-CM | POA: Insufficient documentation

## 2023-05-02 DIAGNOSIS — Z950 Presence of cardiac pacemaker: Secondary | ICD-10-CM | POA: Insufficient documentation

## 2023-05-02 DIAGNOSIS — Z801 Family history of malignant neoplasm of trachea, bronchus and lung: Secondary | ICD-10-CM | POA: Insufficient documentation

## 2023-05-02 DIAGNOSIS — Z170 Estrogen receptor positive status [ER+]: Secondary | ICD-10-CM | POA: Insufficient documentation

## 2023-05-02 LAB — CBC WITH DIFFERENTIAL/PLATELET
Abs Immature Granulocytes: 0.02 10*3/uL (ref 0.00–0.07)
Basophils Absolute: 0 10*3/uL (ref 0.0–0.1)
Basophils Relative: 0 %
Eosinophils Absolute: 0.3 10*3/uL (ref 0.0–0.5)
Eosinophils Relative: 4 %
HCT: 37.3 % (ref 36.0–46.0)
Hemoglobin: 12.4 g/dL (ref 12.0–15.0)
Immature Granulocytes: 0 %
Lymphocytes Relative: 33 %
Lymphs Abs: 2.4 10*3/uL (ref 0.7–4.0)
MCH: 29.5 pg (ref 26.0–34.0)
MCHC: 33.2 g/dL (ref 30.0–36.0)
MCV: 88.8 fL (ref 80.0–100.0)
Monocytes Absolute: 0.5 10*3/uL (ref 0.1–1.0)
Monocytes Relative: 8 %
Neutro Abs: 4 10*3/uL (ref 1.7–7.7)
Neutrophils Relative %: 55 %
Platelets: 189 10*3/uL (ref 150–400)
RBC: 4.2 MIL/uL (ref 3.87–5.11)
RDW: 13.7 % (ref 11.5–15.5)
WBC: 7.2 10*3/uL (ref 4.0–10.5)
nRBC: 0 % (ref 0.0–0.2)

## 2023-05-02 LAB — CMP (CANCER CENTER ONLY)
ALT: 7 U/L (ref 0–44)
AST: 13 U/L — ABNORMAL LOW (ref 15–41)
Albumin: 3.9 g/dL (ref 3.5–5.0)
Alkaline Phosphatase: 41 U/L (ref 38–126)
Anion gap: 3 — ABNORMAL LOW (ref 5–15)
BUN: 24 mg/dL — ABNORMAL HIGH (ref 8–23)
CO2: 32 mmol/L (ref 22–32)
Calcium: 9.7 mg/dL (ref 8.9–10.3)
Chloride: 105 mmol/L (ref 98–111)
Creatinine: 1.09 mg/dL — ABNORMAL HIGH (ref 0.44–1.00)
GFR, Estimated: 51 mL/min — ABNORMAL LOW (ref >60)
Glucose, Bld: 115 mg/dL — ABNORMAL HIGH (ref 70–99)
Potassium: 4.1 mmol/L (ref 3.5–5.1)
Sodium: 140 mmol/L (ref 135–145)
Total Bilirubin: 0.4 mg/dL (ref 0.0–1.2)
Total Protein: 6.9 g/dL (ref 6.5–8.1)

## 2023-05-20 NOTE — Addendum Note (Signed)
 Addended by: Edra Govern D on: 05/20/2023 02:01 PM   Modules accepted: Orders

## 2023-05-20 NOTE — Progress Notes (Signed)
 Remote pacemaker transmission.

## 2023-05-22 DIAGNOSIS — L570 Actinic keratosis: Secondary | ICD-10-CM | POA: Diagnosis not present

## 2023-05-22 DIAGNOSIS — L821 Other seborrheic keratosis: Secondary | ICD-10-CM | POA: Diagnosis not present

## 2023-05-22 DIAGNOSIS — D225 Melanocytic nevi of trunk: Secondary | ICD-10-CM | POA: Diagnosis not present

## 2023-05-22 DIAGNOSIS — L814 Other melanin hyperpigmentation: Secondary | ICD-10-CM | POA: Diagnosis not present

## 2023-05-31 ENCOUNTER — Other Ambulatory Visit: Payer: Self-pay | Admitting: Hematology and Oncology

## 2023-06-04 ENCOUNTER — Ambulatory Visit: Payer: PPO

## 2023-07-01 ENCOUNTER — Ambulatory Visit (INDEPENDENT_AMBULATORY_CARE_PROVIDER_SITE_OTHER): Payer: PPO

## 2023-07-01 DIAGNOSIS — I442 Atrioventricular block, complete: Secondary | ICD-10-CM | POA: Diagnosis not present

## 2023-07-02 LAB — CUP PACEART REMOTE DEVICE CHECK
Battery Remaining Longevity: 96 mo
Battery Remaining Percentage: 94 %
Brady Statistic RA Percent Paced: 18 %
Brady Statistic RV Percent Paced: 97 %
Date Time Interrogation Session: 20250623050100
Implantable Lead Connection Status: 753985
Implantable Lead Connection Status: 753985
Implantable Lead Implant Date: 20070626
Implantable Lead Implant Date: 20070626
Implantable Lead Location: 753859
Implantable Lead Location: 753860
Implantable Lead Model: 4456
Implantable Lead Model: 4469
Implantable Lead Serial Number: 452570
Implantable Lead Serial Number: 480762
Implantable Pulse Generator Implant Date: 20180416
Lead Channel Impedance Value: 328 Ohm
Lead Channel Impedance Value: 411 Ohm
Lead Channel Pacing Threshold Amplitude: 0.4 V
Lead Channel Pacing Threshold Amplitude: 0.9 V
Lead Channel Pacing Threshold Pulse Width: 0.4 ms
Lead Channel Pacing Threshold Pulse Width: 0.4 ms
Lead Channel Setting Pacing Amplitude: 1.5 V
Lead Channel Setting Pacing Amplitude: 2 V
Lead Channel Setting Pacing Pulse Width: 0.4 ms
Lead Channel Setting Sensing Sensitivity: 2.5 mV
Pulse Gen Serial Number: 782681
Zone Setting Status: 755011

## 2023-07-07 ENCOUNTER — Ambulatory Visit: Payer: Self-pay | Admitting: Internal Medicine

## 2023-07-29 DIAGNOSIS — E039 Hypothyroidism, unspecified: Secondary | ICD-10-CM | POA: Diagnosis not present

## 2023-07-29 DIAGNOSIS — C50911 Malignant neoplasm of unspecified site of right female breast: Secondary | ICD-10-CM | POA: Diagnosis not present

## 2023-07-29 DIAGNOSIS — E559 Vitamin D deficiency, unspecified: Secondary | ICD-10-CM | POA: Diagnosis not present

## 2023-07-29 DIAGNOSIS — Z950 Presence of cardiac pacemaker: Secondary | ICD-10-CM | POA: Diagnosis not present

## 2023-07-29 DIAGNOSIS — E782 Mixed hyperlipidemia: Secondary | ICD-10-CM | POA: Diagnosis not present

## 2023-07-29 DIAGNOSIS — I442 Atrioventricular block, complete: Secondary | ICD-10-CM | POA: Diagnosis not present

## 2023-07-29 DIAGNOSIS — I10 Essential (primary) hypertension: Secondary | ICD-10-CM | POA: Diagnosis not present

## 2023-07-29 DIAGNOSIS — Z1331 Encounter for screening for depression: Secondary | ICD-10-CM | POA: Diagnosis not present

## 2023-07-29 DIAGNOSIS — N183 Chronic kidney disease, stage 3 unspecified: Secondary | ICD-10-CM | POA: Diagnosis not present

## 2023-07-29 DIAGNOSIS — R270 Ataxia, unspecified: Secondary | ICD-10-CM | POA: Diagnosis not present

## 2023-07-29 DIAGNOSIS — Z0000 Encounter for general adult medical examination without abnormal findings: Secondary | ICD-10-CM | POA: Diagnosis not present

## 2023-07-29 DIAGNOSIS — E669 Obesity, unspecified: Secondary | ICD-10-CM | POA: Diagnosis not present

## 2023-08-05 DIAGNOSIS — H2512 Age-related nuclear cataract, left eye: Secondary | ICD-10-CM | POA: Diagnosis not present

## 2023-08-05 DIAGNOSIS — H52209 Unspecified astigmatism, unspecified eye: Secondary | ICD-10-CM | POA: Diagnosis not present

## 2023-08-05 NOTE — Addendum Note (Signed)
 Addended by: TAWNI DRILLING D on: 08/05/2023 01:55 PM   Modules accepted: Orders

## 2023-08-05 NOTE — Progress Notes (Signed)
 Remote pacemaker transmission.

## 2023-08-06 DIAGNOSIS — H25041 Posterior subcapsular polar age-related cataract, right eye: Secondary | ICD-10-CM | POA: Diagnosis not present

## 2023-08-06 DIAGNOSIS — H25011 Cortical age-related cataract, right eye: Secondary | ICD-10-CM | POA: Diagnosis not present

## 2023-08-06 DIAGNOSIS — H2511 Age-related nuclear cataract, right eye: Secondary | ICD-10-CM | POA: Diagnosis not present

## 2023-08-14 DIAGNOSIS — Z961 Presence of intraocular lens: Secondary | ICD-10-CM | POA: Diagnosis not present

## 2023-08-14 DIAGNOSIS — Z9842 Cataract extraction status, left eye: Secondary | ICD-10-CM | POA: Diagnosis not present

## 2023-08-14 DIAGNOSIS — H2512 Age-related nuclear cataract, left eye: Secondary | ICD-10-CM | POA: Diagnosis not present

## 2023-08-19 DIAGNOSIS — H2511 Age-related nuclear cataract, right eye: Secondary | ICD-10-CM | POA: Diagnosis not present

## 2023-08-19 DIAGNOSIS — H52209 Unspecified astigmatism, unspecified eye: Secondary | ICD-10-CM | POA: Diagnosis not present

## 2023-09-03 ENCOUNTER — Ambulatory Visit: Payer: PPO

## 2023-09-03 ENCOUNTER — Other Ambulatory Visit: Payer: Self-pay | Admitting: Hematology and Oncology

## 2023-09-03 NOTE — Telephone Encounter (Signed)
 Per 04/2023 OV, continue tamoxifen . Andrea CHRISTELLA Plunk, RN

## 2023-09-24 DIAGNOSIS — E559 Vitamin D deficiency, unspecified: Secondary | ICD-10-CM | POA: Diagnosis not present

## 2023-09-24 DIAGNOSIS — E039 Hypothyroidism, unspecified: Secondary | ICD-10-CM | POA: Diagnosis not present

## 2023-09-30 ENCOUNTER — Ambulatory Visit (INDEPENDENT_AMBULATORY_CARE_PROVIDER_SITE_OTHER): Payer: PPO

## 2023-09-30 DIAGNOSIS — I442 Atrioventricular block, complete: Secondary | ICD-10-CM

## 2023-09-30 LAB — CUP PACEART REMOTE DEVICE CHECK
Battery Remaining Longevity: 90 mo
Battery Remaining Percentage: 91 %
Brady Statistic RA Percent Paced: 17 %
Brady Statistic RV Percent Paced: 98 %
Date Time Interrogation Session: 20250922050000
Implantable Lead Connection Status: 753985
Implantable Lead Connection Status: 753985
Implantable Lead Implant Date: 20070626
Implantable Lead Implant Date: 20070626
Implantable Lead Location: 753859
Implantable Lead Location: 753860
Implantable Lead Model: 4456
Implantable Lead Model: 4469
Implantable Lead Serial Number: 452570
Implantable Lead Serial Number: 480762
Implantable Pulse Generator Implant Date: 20180416
Lead Channel Impedance Value: 337 Ohm
Lead Channel Impedance Value: 423 Ohm
Lead Channel Pacing Threshold Amplitude: 0.5 V
Lead Channel Pacing Threshold Amplitude: 1 V
Lead Channel Pacing Threshold Pulse Width: 0.4 ms
Lead Channel Pacing Threshold Pulse Width: 0.4 ms
Lead Channel Setting Pacing Amplitude: 1.4 V
Lead Channel Setting Pacing Amplitude: 2 V
Lead Channel Setting Pacing Pulse Width: 0.4 ms
Lead Channel Setting Sensing Sensitivity: 2.5 mV
Pulse Gen Serial Number: 782681
Zone Setting Status: 755011

## 2023-10-01 NOTE — Progress Notes (Signed)
 Remote PPM Transmission

## 2023-10-06 ENCOUNTER — Ambulatory Visit: Payer: Self-pay | Admitting: Internal Medicine

## 2023-11-07 DIAGNOSIS — E039 Hypothyroidism, unspecified: Secondary | ICD-10-CM | POA: Diagnosis not present

## 2023-11-07 DIAGNOSIS — E559 Vitamin D deficiency, unspecified: Secondary | ICD-10-CM | POA: Diagnosis not present

## 2023-11-27 DIAGNOSIS — Z1231 Encounter for screening mammogram for malignant neoplasm of breast: Secondary | ICD-10-CM | POA: Diagnosis not present

## 2023-11-28 ENCOUNTER — Other Ambulatory Visit: Payer: Self-pay | Admitting: Hematology and Oncology

## 2023-12-03 ENCOUNTER — Ambulatory Visit: Payer: PPO

## 2023-12-30 ENCOUNTER — Ambulatory Visit: Payer: PPO

## 2023-12-30 DIAGNOSIS — I442 Atrioventricular block, complete: Secondary | ICD-10-CM | POA: Diagnosis not present

## 2023-12-31 LAB — CUP PACEART REMOTE DEVICE CHECK
Battery Remaining Longevity: 90 mo
Battery Remaining Percentage: 88 %
Brady Statistic RA Percent Paced: 17 %
Brady Statistic RV Percent Paced: 99 %
Date Time Interrogation Session: 20251222050100
Implantable Lead Connection Status: 753985
Implantable Lead Connection Status: 753985
Implantable Lead Implant Date: 20070626
Implantable Lead Implant Date: 20070626
Implantable Lead Location: 753859
Implantable Lead Location: 753860
Implantable Lead Model: 4456
Implantable Lead Model: 4469
Implantable Lead Serial Number: 452570
Implantable Lead Serial Number: 480762
Implantable Pulse Generator Implant Date: 20180416
Lead Channel Impedance Value: 333 Ohm
Lead Channel Impedance Value: 409 Ohm
Lead Channel Pacing Threshold Amplitude: 0.4 V
Lead Channel Pacing Threshold Amplitude: 1 V
Lead Channel Pacing Threshold Pulse Width: 0.4 ms
Lead Channel Pacing Threshold Pulse Width: 0.4 ms
Lead Channel Setting Pacing Amplitude: 1.4 V
Lead Channel Setting Pacing Amplitude: 2 V
Lead Channel Setting Pacing Pulse Width: 0.4 ms
Lead Channel Setting Sensing Sensitivity: 2.5 mV
Pulse Gen Serial Number: 782681
Zone Setting Status: 755011

## 2024-01-01 NOTE — Progress Notes (Signed)
 Remote PPM Transmission

## 2024-01-05 ENCOUNTER — Ambulatory Visit: Payer: Self-pay | Admitting: Internal Medicine

## 2024-03-03 ENCOUNTER — Ambulatory Visit: Payer: PPO

## 2024-03-30 ENCOUNTER — Ambulatory Visit

## 2024-05-01 ENCOUNTER — Ambulatory Visit: Admitting: Hematology and Oncology

## 2024-06-29 ENCOUNTER — Ambulatory Visit

## 2024-09-28 ENCOUNTER — Ambulatory Visit
